# Patient Record
Sex: Female | Born: 1945
Health system: Southern US, Community
[De-identification: ages and names within clinical notes are randomized; demographics above are authoritative.]

## PROBLEM LIST (undated history)

## (undated) DIAGNOSIS — J45909 Unspecified asthma, uncomplicated: Secondary | ICD-10-CM

---

## 1998-04-04 ENCOUNTER — Ambulatory Visit (HOSPITAL_COMMUNITY): Admission: RE | Admit: 1998-04-04 | Discharge: 1998-04-04 | Payer: Self-pay | Admitting: Obstetrics and Gynecology

## 1998-04-04 ENCOUNTER — Encounter: Payer: Self-pay | Admitting: Obstetrics and Gynecology

## 1998-10-12 ENCOUNTER — Other Ambulatory Visit: Admission: RE | Admit: 1998-10-12 | Discharge: 1998-10-12 | Payer: Self-pay | Admitting: Obstetrics and Gynecology

## 1999-04-08 ENCOUNTER — Encounter: Payer: Self-pay | Admitting: Obstetrics and Gynecology

## 1999-04-08 ENCOUNTER — Ambulatory Visit (HOSPITAL_COMMUNITY): Admission: RE | Admit: 1999-04-08 | Discharge: 1999-04-08 | Payer: Self-pay | Admitting: Obstetrics and Gynecology

## 1999-12-09 ENCOUNTER — Other Ambulatory Visit: Admission: RE | Admit: 1999-12-09 | Discharge: 1999-12-09 | Payer: Self-pay | Admitting: Obstetrics and Gynecology

## 2000-04-09 ENCOUNTER — Ambulatory Visit (HOSPITAL_COMMUNITY): Admission: RE | Admit: 2000-04-09 | Discharge: 2000-04-09 | Payer: Self-pay | Admitting: Obstetrics and Gynecology

## 2000-04-09 ENCOUNTER — Encounter: Payer: Self-pay | Admitting: Obstetrics and Gynecology

## 2001-01-07 ENCOUNTER — Other Ambulatory Visit: Admission: RE | Admit: 2001-01-07 | Discharge: 2001-01-07 | Payer: Self-pay | Admitting: Obstetrics and Gynecology

## 2001-04-12 ENCOUNTER — Ambulatory Visit (HOSPITAL_COMMUNITY): Admission: RE | Admit: 2001-04-12 | Discharge: 2001-04-12 | Payer: Self-pay | Admitting: Obstetrics and Gynecology

## 2001-04-12 ENCOUNTER — Encounter: Payer: Self-pay | Admitting: Obstetrics and Gynecology

## 2001-05-28 ENCOUNTER — Encounter (INDEPENDENT_AMBULATORY_CARE_PROVIDER_SITE_OTHER): Payer: Self-pay

## 2001-05-28 ENCOUNTER — Ambulatory Visit (HOSPITAL_COMMUNITY): Admission: RE | Admit: 2001-05-28 | Discharge: 2001-05-28 | Payer: Self-pay | Admitting: Obstetrics and Gynecology

## 2002-04-13 ENCOUNTER — Encounter: Payer: Self-pay | Admitting: Obstetrics and Gynecology

## 2002-04-13 ENCOUNTER — Ambulatory Visit (HOSPITAL_COMMUNITY): Admission: RE | Admit: 2002-04-13 | Discharge: 2002-04-13 | Payer: Self-pay | Admitting: Obstetrics and Gynecology

## 2002-04-20 ENCOUNTER — Other Ambulatory Visit: Admission: RE | Admit: 2002-04-20 | Discharge: 2002-04-20 | Payer: Self-pay | Admitting: Obstetrics and Gynecology

## 2003-04-17 ENCOUNTER — Ambulatory Visit (HOSPITAL_COMMUNITY): Admission: RE | Admit: 2003-04-17 | Discharge: 2003-04-17 | Payer: Self-pay | Admitting: Obstetrics and Gynecology

## 2003-04-24 ENCOUNTER — Other Ambulatory Visit: Admission: RE | Admit: 2003-04-24 | Discharge: 2003-04-24 | Payer: Self-pay | Admitting: Obstetrics and Gynecology

## 2004-04-22 ENCOUNTER — Ambulatory Visit (HOSPITAL_COMMUNITY): Admission: RE | Admit: 2004-04-22 | Discharge: 2004-04-22 | Payer: Self-pay | Admitting: Obstetrics and Gynecology

## 2004-07-16 ENCOUNTER — Other Ambulatory Visit: Admission: RE | Admit: 2004-07-16 | Discharge: 2004-07-16 | Payer: Self-pay | Admitting: Obstetrics and Gynecology

## 2004-09-09 ENCOUNTER — Ambulatory Visit (HOSPITAL_COMMUNITY): Admission: RE | Admit: 2004-09-09 | Discharge: 2004-09-09 | Payer: Self-pay | Admitting: Gastroenterology

## 2005-04-30 ENCOUNTER — Ambulatory Visit (HOSPITAL_COMMUNITY): Admission: RE | Admit: 2005-04-30 | Discharge: 2005-04-30 | Payer: Self-pay | Admitting: Obstetrics and Gynecology

## 2005-08-01 ENCOUNTER — Other Ambulatory Visit: Admission: RE | Admit: 2005-08-01 | Discharge: 2005-08-01 | Payer: Self-pay | Admitting: Obstetrics and Gynecology

## 2006-05-04 ENCOUNTER — Ambulatory Visit (HOSPITAL_COMMUNITY): Admission: RE | Admit: 2006-05-04 | Discharge: 2006-05-04 | Payer: Self-pay | Admitting: Obstetrics and Gynecology

## 2007-05-07 ENCOUNTER — Ambulatory Visit (HOSPITAL_COMMUNITY): Admission: RE | Admit: 2007-05-07 | Discharge: 2007-05-07 | Payer: Self-pay | Admitting: Obstetrics and Gynecology

## 2008-05-08 ENCOUNTER — Ambulatory Visit (HOSPITAL_COMMUNITY): Admission: RE | Admit: 2008-05-08 | Discharge: 2008-05-08 | Payer: Self-pay | Admitting: Obstetrics and Gynecology

## 2009-05-09 ENCOUNTER — Ambulatory Visit (HOSPITAL_COMMUNITY): Admission: RE | Admit: 2009-05-09 | Discharge: 2009-05-09 | Payer: Self-pay | Admitting: Obstetrics and Gynecology

## 2010-05-10 ENCOUNTER — Ambulatory Visit (HOSPITAL_COMMUNITY)
Admission: RE | Admit: 2010-05-10 | Discharge: 2010-05-10 | Payer: Self-pay | Source: Home / Self Care | Attending: Obstetrics and Gynecology | Admitting: Obstetrics and Gynecology

## 2010-10-11 NOTE — Op Note (Signed)
NAME:  Linda Moreno, Linda Moreno                  ACCOUNT NO.:  [id number]   MEDICAL RECORD NO.:  [id number]          PATIENT TYPE:  AMB   LOCATION:  ENDO                         FACILITY:  [facility]   PHYSICIAN:  James L. Malon Kindle., M.D.DATE OF BIRTH:  1946-03-11   DATE OF PROCEDURE:  09/09/2004  DATE OF DISCHARGE:                                 OPERATIVE REPORT   PROCEDURE:  Colonoscopy.   MEDICATIONS:  Fentanyl 75 mcg, Versed 8 mg IV.   SCOPE:  Olympus pediatric adjustable colonoscope.   INDICATIONS:  Colon cancer screening.   DESCRIPTION OF PROCEDURE:  The procedure has been explained to the patient  and consent obtained.  With the patient in the left lateral decubitus  position, the Olympus scope was inserted and advanced.  The prep was  excellent.  I was able to reach the cecum without difficulty.  The ileocecal  valve and appendiceal orifice seen.  The scope withdrawn in the cecum.  The  ascending colon, transverse colon, splenic flexure, descending and sigmoid  colon were seen well.  No polyps or other lesions seen.  The scope was  withdrawn back into the rectum.  The rectum was free of polyps.  The patient  tolerated the procedure well.  She was maintained on low-flow oxygen and  pulse oximeter throughout the procedure with no obvious problem.   ASSESSMENT:  Normal screening colonoscopy.  V76.51.   PLAN:  Will recommend yearly Hemoccults and possibly a repeat procedure in  10 years.      JLE/MEDQ  D:  09/09/2004  T:  09/09/2004  Job:  [id number]   cc:   Gregary Signs A. Everardo All, M.D. [facility]   Juluis Mire, M.D.  [address] [place]  [place] [address]  Fax: [phone]

## 2010-10-11 NOTE — H&P (Signed)
[facility]  Patient:    Linda Moreno, Linda Moreno Visit Number: [id number] MRN: [id number]          Service Type: Attending:  Juluis Mire, M.D. Dictated by:   Juluis Mire, M.D. Adm. Date:  05/28/01                           History and Physical  HISTORY OF PRESENT ILLNESS:   The patient is a 65 year old postmenopausal black female who presents for a hysteroscopic evaluation due to postmenopausal bleeding and probable endometrial polyp.  In relation to the present admission, the patient had discontinued hormone replacement therapy.  Subsequently had the onset of vaginal bleeding in October.  She came in for a saline infusion ultrasound, which revealed a possible endometrial polyp and thickening, and now present for hysteroscopic evaluation to rule out an endometrial pathology.  ALLERGIES:                    She has no known drug allergies.  MEDICATIONS:                  None.  PAST MEDICAL HISTORY:         Usual childhood diseases, no significant sequelae.  Does have a history of asthma.  PAST SURGICAL HISTORY:        None.  OBSTETRICAL HISTORY:          She has had three spontaneous vaginal deliveries.  FAMILY HISTORY:               Noncontributory.  SOCIAL HISTORY:               No tobacco or alcohol use.  REVIEW OF SYSTEMS:            Noncontributory.  PHYSICAL EXAMINATION:  VITAL SIGNS:                  The patient is afebrile with stable vital signs.  HEENT:                        Patient normocephalic.  Pupils equal, round and reactive to light and accommodation.  Extraocular movements were intact. Sclerae and conjunctivae clear.  Oropharynx clear.  NECK:                         Without thyromegaly.  BREASTS:                      No discrete masses.  CHEST:                        Lungs clear.  CARDIAC:                      Regular rhythm and rate without murmurs or gallops.  ABDOMEN:                      Benign.  No masses,  organomegaly, or tenderness.  PELVIC:                       Normal external genitalia.  Vaginal mucosa clear.  Cervix unremarkable.  Uterus normal size, shape, and contour.  Adnexa free of mass or tenderness.  Rectovaginal exam is clear.  EXTREMITIES:  Trace edema.  NEUROLOGIC:                   Grossly within normal limits.  IMPRESSION:                   Postmenopausal bleeding with evidence of endometrial thickening and/or polyp.  PLAN:                         The patient will undergo hysteroscopic evaluation.  The risks have been discussed including the risk of anesthesia, the risk of vascular injury that could lead to hemorrhage requiring hysterectomy or possible transfusion with the risk of AIDS or hepatitis, risk of injury to adjacent organs through uterine perforation that could require laparoscopy and possible exploratory laparotomy, risk of deep venous thrombosis or pulmonary embolus.  The patient expressed an understanding of indications and risks. Dictated by:   Juluis Mire, M.D. Attending:  Juluis Mire, M.D. DD:  05/28/01 TD:  05/28/01 Job: [id number]

## 2010-10-11 NOTE — Op Note (Signed)
[facility] [place]  Patient:    Linda Moreno, Linda Moreno Visit Number: [id number] MRN: [id number]          Service Type: DSU Location: [facility] Attending Physician:  Frederich Balding Dictated by:   Juluis Mire, M.D. Proc. Date: 05/28/01 Admit Date:  05/28/2001                             Operative Report  PREOPERATIVE DIAGNOSES:       Endometrial polyp.  POSTOPERATIVE DIAGNOSES:      Endometrial polyp.  OPERATIVE PROCEDURE:          Hysteroscopy with resection of endometrial polyp, multiple endometrial biopsies, endometrial curettings.  SURGEON:                      Juluis Mire, M.D.  ANESTHESIA:                   General.  ESTIMATED BLOOD LOSS:         Minimal.  PACKS AND DRAINS:             None.  INTRAOPERATIVE BLOOD:         None.  COMPLICATIONS:                None.  INDICATIONS:                  Dictated in the history and physical.  PROCEDURE:                    Patient was taken to the OR and placed in the supine position.  After a satisfactory level of general anesthesia using the laryngeal mask the patient was placed in dorsal lithotomy position using Allen stirrups.  Perineum and vagina prepped out with Betadine.  Draped in a sterile field.  Speculum was placed in the vaginal vault.  Cervix grasped with Christella Hartigan tenaculum.  Uterus sounded to approximately 8 cm.  Cervix serially dilated to a size 35 Pratt dilator.  An operative hysteroscope was introduced. Visualization revealed a posterior wall polyp.  This was resected and sent for pathologic review.  Multiple endometrial samples were obtained along with curettings.  There was no evidence of perforation or active bleeding.  The single tooth tenaculum was then removed.  The patient taken out of the dorsal lithotomy position.  Once alert and extubated transferred to recovery room in good condition.  Sponge, needle, instrument count reported as correct by circulated nurse. Dictated by:    Juluis Mire, M.D. Attending Physician:  Frederich Balding DD:  05/28/01 TD:  05/28/01 Job: [id number]

## 2011-04-09 ENCOUNTER — Other Ambulatory Visit (HOSPITAL_COMMUNITY): Payer: Self-pay | Admitting: Obstetrics and Gynecology

## 2011-04-09 DIAGNOSIS — Z1231 Encounter for screening mammogram for malignant neoplasm of breast: Secondary | ICD-10-CM

## 2011-05-12 ENCOUNTER — Ambulatory Visit (HOSPITAL_COMMUNITY)
Admission: RE | Admit: 2011-05-12 | Discharge: 2011-05-12 | Disposition: A | Discharge status: Home / Self Care | Payer: Medicare Other | Source: Ambulatory Visit | Attending: Obstetrics and Gynecology | Admitting: Obstetrics and Gynecology

## 2011-05-12 DIAGNOSIS — Z1231 Encounter for screening mammogram for malignant neoplasm of breast: Secondary | ICD-10-CM

## 2012-04-16 ENCOUNTER — Other Ambulatory Visit (HOSPITAL_COMMUNITY): Payer: Self-pay | Admitting: Obstetrics and Gynecology

## 2012-04-16 DIAGNOSIS — Z1231 Encounter for screening mammogram for malignant neoplasm of breast: Secondary | ICD-10-CM

## 2012-05-12 ENCOUNTER — Ambulatory Visit (HOSPITAL_COMMUNITY)
Admission: RE | Admit: 2012-05-12 | Discharge: 2012-05-12 | Disposition: A | Discharge status: Home / Self Care | Payer: Medicare Other | Source: Ambulatory Visit | Attending: Obstetrics and Gynecology | Admitting: Obstetrics and Gynecology

## 2012-05-12 DIAGNOSIS — Z1231 Encounter for screening mammogram for malignant neoplasm of breast: Secondary | ICD-10-CM | POA: Insufficient documentation

## 2013-04-11 ENCOUNTER — Other Ambulatory Visit (HOSPITAL_COMMUNITY): Payer: Self-pay | Admitting: Obstetrics and Gynecology

## 2013-04-11 DIAGNOSIS — Z1231 Encounter for screening mammogram for malignant neoplasm of breast: Secondary | ICD-10-CM

## 2013-05-16 ENCOUNTER — Ambulatory Visit (HOSPITAL_COMMUNITY)
Admission: RE | Admit: 2013-05-16 | Discharge: 2013-05-16 | Disposition: A | Discharge status: Home / Self Care | Payer: Medicare Other | Source: Ambulatory Visit | Attending: Obstetrics and Gynecology | Admitting: Obstetrics and Gynecology

## 2013-05-16 DIAGNOSIS — Z1231 Encounter for screening mammogram for malignant neoplasm of breast: Secondary | ICD-10-CM | POA: Insufficient documentation

## 2014-04-11 ENCOUNTER — Other Ambulatory Visit (HOSPITAL_COMMUNITY): Payer: Self-pay | Admitting: Internal Medicine

## 2014-04-11 DIAGNOSIS — Z1231 Encounter for screening mammogram for malignant neoplasm of breast: Secondary | ICD-10-CM

## 2014-05-23 ENCOUNTER — Ambulatory Visit (HOSPITAL_COMMUNITY)
Admission: RE | Admit: 2014-05-23 | Discharge: 2014-05-23 | Disposition: A | Discharge status: Home / Self Care | Payer: Medicare HMO | Source: Ambulatory Visit | Attending: Internal Medicine | Admitting: Internal Medicine

## 2014-05-23 DIAGNOSIS — Z1231 Encounter for screening mammogram for malignant neoplasm of breast: Secondary | ICD-10-CM

## 2014-12-11 ENCOUNTER — Other Ambulatory Visit: Payer: Self-pay | Admitting: Gastroenterology

## 2015-05-07 ENCOUNTER — Other Ambulatory Visit: Payer: Self-pay

## 2015-05-07 DIAGNOSIS — Z1231 Encounter for screening mammogram for malignant neoplasm of breast: Secondary | ICD-10-CM

## 2015-05-18 ENCOUNTER — Ambulatory Visit
Admission: RE | Admit: 2015-05-18 | Discharge: 2015-05-18 | Disposition: A | Discharge status: Home / Self Care | Payer: Medicare HMO | Source: Ambulatory Visit | Attending: Allergy | Admitting: Allergy

## 2015-05-18 ENCOUNTER — Other Ambulatory Visit: Payer: Self-pay | Admitting: Allergy

## 2015-05-18 DIAGNOSIS — J4530 Mild persistent asthma, uncomplicated: Secondary | ICD-10-CM

## 2015-05-29 ENCOUNTER — Ambulatory Visit: Payer: No Typology Code available for payment source

## 2015-06-05 ENCOUNTER — Ambulatory Visit
Admission: RE | Admit: 2015-06-05 | Discharge: 2015-06-05 | Disposition: A | Discharge status: Home / Self Care | Payer: Medicare HMO | Source: Ambulatory Visit

## 2015-06-05 DIAGNOSIS — Z1231 Encounter for screening mammogram for malignant neoplasm of breast: Secondary | ICD-10-CM

## 2016-04-28 ENCOUNTER — Other Ambulatory Visit: Payer: Self-pay | Admitting: Obstetrics and Gynecology

## 2016-04-28 DIAGNOSIS — Z1231 Encounter for screening mammogram for malignant neoplasm of breast: Secondary | ICD-10-CM

## 2016-05-28 DIAGNOSIS — J3089 Other allergic rhinitis: Secondary | ICD-10-CM | POA: Diagnosis not present

## 2016-05-28 DIAGNOSIS — J301 Allergic rhinitis due to pollen: Secondary | ICD-10-CM | POA: Diagnosis not present

## 2016-06-03 DIAGNOSIS — J3089 Other allergic rhinitis: Secondary | ICD-10-CM | POA: Diagnosis not present

## 2016-06-03 DIAGNOSIS — J301 Allergic rhinitis due to pollen: Secondary | ICD-10-CM | POA: Diagnosis not present

## 2016-06-05 ENCOUNTER — Ambulatory Visit
Admission: RE | Admit: 2016-06-05 | Discharge: 2016-06-05 | Disposition: A | Discharge status: Home / Self Care | Payer: PPO | Source: Ambulatory Visit | Attending: Obstetrics and Gynecology | Admitting: Obstetrics and Gynecology

## 2016-06-05 ENCOUNTER — Ambulatory Visit: Payer: Medicare HMO

## 2016-06-05 DIAGNOSIS — Z1231 Encounter for screening mammogram for malignant neoplasm of breast: Secondary | ICD-10-CM

## 2016-06-09 DIAGNOSIS — J301 Allergic rhinitis due to pollen: Secondary | ICD-10-CM | POA: Diagnosis not present

## 2016-06-09 DIAGNOSIS — J3089 Other allergic rhinitis: Secondary | ICD-10-CM | POA: Diagnosis not present

## 2016-06-13 DIAGNOSIS — J301 Allergic rhinitis due to pollen: Secondary | ICD-10-CM | POA: Diagnosis not present

## 2016-06-13 DIAGNOSIS — J3089 Other allergic rhinitis: Secondary | ICD-10-CM | POA: Diagnosis not present

## 2016-06-24 ENCOUNTER — Ambulatory Visit (HOSPITAL_COMMUNITY)
Admission: EM | Admit: 2016-06-24 | Discharge: 2016-06-24 | Disposition: A | Discharge status: Home / Self Care | Payer: PPO | Attending: Family Medicine | Admitting: Family Medicine

## 2016-06-24 ENCOUNTER — Ambulatory Visit (INDEPENDENT_AMBULATORY_CARE_PROVIDER_SITE_OTHER): Payer: PPO

## 2016-06-24 ENCOUNTER — Encounter (HOSPITAL_COMMUNITY): Payer: Self-pay | Admitting: Emergency Medicine

## 2016-06-24 DIAGNOSIS — J4521 Mild intermittent asthma with (acute) exacerbation: Secondary | ICD-10-CM

## 2016-06-24 DIAGNOSIS — R05 Cough: Secondary | ICD-10-CM | POA: Diagnosis not present

## 2016-06-24 HISTORY — DX: Unspecified asthma, uncomplicated: J45.909

## 2016-06-24 MED ORDER — METHYLPREDNISOLONE SODIUM SUCC 125 MG IJ SOLR
INTRAMUSCULAR | Status: AC
  Filled 2016-06-24: qty 2

## 2016-06-24 MED ORDER — METHYLPREDNISOLONE SODIUM SUCC 125 MG IJ SOLR
125.0000 mg | Freq: Once | INTRAMUSCULAR | Status: AC
  Administered 2016-06-24: 125 mg via INTRAMUSCULAR

## 2016-06-24 MED ORDER — ALBUTEROL SULFATE (2.5 MG/3ML) 0.083% IN NEBU
INHALATION_SOLUTION | RESPIRATORY_TRACT | Status: AC
  Filled 2016-06-24: qty 6

## 2016-06-24 MED ORDER — ALBUTEROL SULFATE (2.5 MG/3ML) 0.083% IN NEBU
5.0000 mg | INHALATION_SOLUTION | Freq: Once | RESPIRATORY_TRACT | Status: AC
  Administered 2016-06-24: 5 mg via RESPIRATORY_TRACT

## 2016-06-24 MED ORDER — IPRATROPIUM BROMIDE 0.02 % IN SOLN
RESPIRATORY_TRACT | Status: AC
  Filled 2016-06-24: qty 2.5

## 2016-06-24 MED ORDER — IPRATROPIUM BROMIDE 0.02 % IN SOLN
0.5000 mg | Freq: Once | RESPIRATORY_TRACT | Status: AC
  Administered 2016-06-24: 0.5 mg via RESPIRATORY_TRACT

## 2016-06-24 NOTE — ED Triage Notes (Signed)
Pt has been suffering from a productive cough for one week.  She denies any other symptoms, including no fever.  Pt has a history of asthma and allergy issues.

## 2016-06-24 NOTE — ED Provider Notes (Signed)
[place]    CSN: [id number] Arrival date & time: 06/24/16  1439     History   Chief Complaint Chief Complaint  Patient presents with  . Cough    HPI Linda Moreno No is a 71 y.o. female.   The history is provided by the patient.  Cough  Cough characteristics:  Productive Sputum characteristics:  Clear Severity:  Moderate Onset quality:  Gradual Duration:  1 week Progression:  Unchanged Chronicity:  New Smoker: no   Context comment:  H/o asthma Relieved by:  None tried Associated symptoms: rhinorrhea, shortness of breath and wheezing   Associated symptoms: no fever     Past Medical History:  Diagnosis Date  . Asthma     There are no active problems to display for this patient.   History reviewed. No pertinent surgical history.  OB History    No data available       Home Medications    Prior to Admission medications   Medication Sig Start Date End Date Taking? Authorizing Provider  albuterol (PROVENTIL HFA;VENTOLIN HFA) 108 (90 Base) MCG/ACT inhaler Inhale 2 puffs into the lungs every 4 (four) hours as needed for wheezing or shortness of breath.   Yes Historical Provider, MD  fluticasone furoate-vilanterol (BREO ELLIPTA) 100-25 MCG/INH AEPB Inhale 1 puff into the lungs daily.   Yes Historical Provider, MD    Family History History reviewed. No pertinent family history.  Social History Social History  Substance Use Topics  . Smoking status: Never Smoker  . Smokeless tobacco: Never Used  . Alcohol use No     Allergies   Patient has no known allergies.   Review of Systems Review of Systems  Constitutional: Negative for fever.  HENT: Positive for rhinorrhea.   Respiratory: Positive for cough, shortness of breath and wheezing.      Physical Exam Triage Vital Signs ED Triage Vitals [06/24/16 1614]  Enc Vitals Group     BP (!) 162/104     Pulse Rate 88     Resp      Temp 98.9 F (37.2 C)     Temp Source Oral     SpO2 97 %       Weight      Height      Head Circumference      Peak Flow      Pain Score      Pain Loc      Pain Edu?      Excl. in [place]?    No data found.   Updated Vital Signs BP (!) 162/104 (BP Location: Right Arm)   Pulse 88   Temp 98.9 F (37.2 C) (Oral)   SpO2 97%   Visual Acuity Right Eye Distance:   Left Eye Distance:   Bilateral Distance:    Right Eye Near:   Left Eye Near:    Bilateral Near:     Physical Exam   UC Treatments / Results  Labs (all labs ordered are listed, but only abnormal results are displayed) Labs Reviewed - No data to display  EKG  EKG Interpretation None       Radiology Dg Chest 2 View  Result Date: 06/24/2016 CLINICAL DATA:  One week of cough and body aches. Nonsmoker. History of asthma. EXAM: CHEST  2 VIEW COMPARISON:  Chest x-ray of May 18, 2015 FINDINGS: The lungs are well-expanded. There is no focal infiltrate. There is no pleural effusion. The heart and pulmonary vascularity are normal. The  mediastinum is normal in width. There is calcification in the wall of the aortic arch. There is multilevel degenerative disc disease of the thoracic spine. IMPRESSION: There is no pneumonia, CHF, nor other acute cardiopulmonary abnormality. Thoracic aortic atherosclerosis. Electronically Signed   By: David  Martinique M.D.   On: 06/24/2016 17:10    Procedures Procedures (including critical care time)  Medications Ordered in UC Medications  albuterol (PROVENTIL) (2.5 MG/3ML) 0.083% nebulizer solution 5 mg (5 mg Nebulization Given 06/24/16 1812)  ipratropium (ATROVENT) nebulizer solution 0.5 mg (0.5 mg Nebulization Given 06/24/16 1812)  methylPREDNISolone sodium succinate (SOLU-MEDROL) 125 mg/2 mL injection 125 mg (125 mg Intramuscular Given 06/24/16 1809)     Initial Impression / Assessment and Plan / UC Course  I have reviewed the triage vital signs and the nursing notes.  Pertinent labs & imaging results that were available during my care of the  patient were reviewed by me and considered in my medical decision making (see chart for details).       Final Clinical Impressions(s) / UC Diagnoses   Final diagnoses:  Mild intermittent asthma with exacerbation    New Prescriptions Discharge Medication List as of 06/24/2016  6:09 PM       Billy Fischer, MD 06/26/16 [phone]

## 2016-06-24 NOTE — Discharge Instructions (Signed)
Use your medicine as needed and see your doctor if further problems.

## 2016-06-26 DIAGNOSIS — J3089 Other allergic rhinitis: Secondary | ICD-10-CM | POA: Diagnosis not present

## 2016-06-26 DIAGNOSIS — J301 Allergic rhinitis due to pollen: Secondary | ICD-10-CM | POA: Diagnosis not present

## 2016-06-27 DIAGNOSIS — J45909 Unspecified asthma, uncomplicated: Secondary | ICD-10-CM | POA: Diagnosis not present

## 2016-07-09 DIAGNOSIS — J301 Allergic rhinitis due to pollen: Secondary | ICD-10-CM | POA: Diagnosis not present

## 2016-07-09 DIAGNOSIS — J3089 Other allergic rhinitis: Secondary | ICD-10-CM | POA: Diagnosis not present

## 2016-07-16 DIAGNOSIS — J301 Allergic rhinitis due to pollen: Secondary | ICD-10-CM | POA: Diagnosis not present

## 2016-07-16 DIAGNOSIS — J3089 Other allergic rhinitis: Secondary | ICD-10-CM | POA: Diagnosis not present

## 2016-07-22 DIAGNOSIS — J301 Allergic rhinitis due to pollen: Secondary | ICD-10-CM | POA: Diagnosis not present

## 2016-07-22 DIAGNOSIS — J3089 Other allergic rhinitis: Secondary | ICD-10-CM | POA: Diagnosis not present

## 2016-07-30 DIAGNOSIS — J3081 Allergic rhinitis due to animal (cat) (dog) hair and dander: Secondary | ICD-10-CM | POA: Diagnosis not present

## 2016-07-30 DIAGNOSIS — J3089 Other allergic rhinitis: Secondary | ICD-10-CM | POA: Diagnosis not present

## 2016-07-30 DIAGNOSIS — J301 Allergic rhinitis due to pollen: Secondary | ICD-10-CM | POA: Diagnosis not present

## 2016-08-06 DIAGNOSIS — J3089 Other allergic rhinitis: Secondary | ICD-10-CM | POA: Diagnosis not present

## 2016-08-06 DIAGNOSIS — J301 Allergic rhinitis due to pollen: Secondary | ICD-10-CM | POA: Diagnosis not present

## 2016-08-12 DIAGNOSIS — J3089 Other allergic rhinitis: Secondary | ICD-10-CM | POA: Diagnosis not present

## 2016-08-12 DIAGNOSIS — J3081 Allergic rhinitis due to animal (cat) (dog) hair and dander: Secondary | ICD-10-CM | POA: Diagnosis not present

## 2016-08-12 DIAGNOSIS — J301 Allergic rhinitis due to pollen: Secondary | ICD-10-CM | POA: Diagnosis not present

## 2016-08-19 DIAGNOSIS — J301 Allergic rhinitis due to pollen: Secondary | ICD-10-CM | POA: Diagnosis not present

## 2016-08-19 DIAGNOSIS — J3081 Allergic rhinitis due to animal (cat) (dog) hair and dander: Secondary | ICD-10-CM | POA: Diagnosis not present

## 2016-08-19 DIAGNOSIS — J3089 Other allergic rhinitis: Secondary | ICD-10-CM | POA: Diagnosis not present

## 2016-08-28 DIAGNOSIS — J301 Allergic rhinitis due to pollen: Secondary | ICD-10-CM | POA: Diagnosis not present

## 2016-08-28 DIAGNOSIS — J3089 Other allergic rhinitis: Secondary | ICD-10-CM | POA: Diagnosis not present

## 2016-09-02 DIAGNOSIS — J3089 Other allergic rhinitis: Secondary | ICD-10-CM | POA: Diagnosis not present

## 2016-09-02 DIAGNOSIS — J3081 Allergic rhinitis due to animal (cat) (dog) hair and dander: Secondary | ICD-10-CM | POA: Diagnosis not present

## 2016-09-02 DIAGNOSIS — J301 Allergic rhinitis due to pollen: Secondary | ICD-10-CM | POA: Diagnosis not present

## 2016-09-09 DIAGNOSIS — J3081 Allergic rhinitis due to animal (cat) (dog) hair and dander: Secondary | ICD-10-CM | POA: Diagnosis not present

## 2016-09-09 DIAGNOSIS — J3089 Other allergic rhinitis: Secondary | ICD-10-CM | POA: Diagnosis not present

## 2016-09-09 DIAGNOSIS — J301 Allergic rhinitis due to pollen: Secondary | ICD-10-CM | POA: Diagnosis not present

## 2016-09-18 DIAGNOSIS — J301 Allergic rhinitis due to pollen: Secondary | ICD-10-CM | POA: Diagnosis not present

## 2016-09-18 DIAGNOSIS — J3089 Other allergic rhinitis: Secondary | ICD-10-CM | POA: Diagnosis not present

## 2016-09-25 DIAGNOSIS — R7309 Other abnormal glucose: Secondary | ICD-10-CM | POA: Diagnosis not present

## 2016-09-25 DIAGNOSIS — E784 Other hyperlipidemia: Secondary | ICD-10-CM | POA: Diagnosis not present

## 2016-09-25 DIAGNOSIS — E785 Hyperlipidemia, unspecified: Secondary | ICD-10-CM | POA: Diagnosis not present

## 2016-09-25 DIAGNOSIS — T7840XS Allergy, unspecified, sequela: Secondary | ICD-10-CM | POA: Diagnosis not present

## 2016-09-25 DIAGNOSIS — J45909 Unspecified asthma, uncomplicated: Secondary | ICD-10-CM | POA: Diagnosis not present

## 2016-09-30 DIAGNOSIS — J3081 Allergic rhinitis due to animal (cat) (dog) hair and dander: Secondary | ICD-10-CM | POA: Diagnosis not present

## 2016-09-30 DIAGNOSIS — J3089 Other allergic rhinitis: Secondary | ICD-10-CM | POA: Diagnosis not present

## 2016-09-30 DIAGNOSIS — J301 Allergic rhinitis due to pollen: Secondary | ICD-10-CM | POA: Diagnosis not present

## 2016-10-07 DIAGNOSIS — J3089 Other allergic rhinitis: Secondary | ICD-10-CM | POA: Diagnosis not present

## 2016-10-07 DIAGNOSIS — J301 Allergic rhinitis due to pollen: Secondary | ICD-10-CM | POA: Diagnosis not present

## 2016-10-07 DIAGNOSIS — J3081 Allergic rhinitis due to animal (cat) (dog) hair and dander: Secondary | ICD-10-CM | POA: Diagnosis not present

## 2016-10-14 DIAGNOSIS — J301 Allergic rhinitis due to pollen: Secondary | ICD-10-CM | POA: Diagnosis not present

## 2016-10-14 DIAGNOSIS — J3089 Other allergic rhinitis: Secondary | ICD-10-CM | POA: Diagnosis not present

## 2016-10-14 DIAGNOSIS — J3081 Allergic rhinitis due to animal (cat) (dog) hair and dander: Secondary | ICD-10-CM | POA: Diagnosis not present

## 2016-10-22 DIAGNOSIS — J301 Allergic rhinitis due to pollen: Secondary | ICD-10-CM | POA: Diagnosis not present

## 2016-10-22 DIAGNOSIS — J3089 Other allergic rhinitis: Secondary | ICD-10-CM | POA: Diagnosis not present

## 2016-10-27 DIAGNOSIS — J301 Allergic rhinitis due to pollen: Secondary | ICD-10-CM | POA: Diagnosis not present

## 2016-10-27 DIAGNOSIS — Z6830 Body mass index (BMI) 30.0-30.9, adult: Secondary | ICD-10-CM | POA: Diagnosis not present

## 2016-10-27 DIAGNOSIS — Z01419 Encounter for gynecological examination (general) (routine) without abnormal findings: Secondary | ICD-10-CM | POA: Diagnosis not present

## 2016-10-27 DIAGNOSIS — J3089 Other allergic rhinitis: Secondary | ICD-10-CM | POA: Diagnosis not present

## 2016-11-05 DIAGNOSIS — J301 Allergic rhinitis due to pollen: Secondary | ICD-10-CM | POA: Diagnosis not present

## 2016-11-05 DIAGNOSIS — J3089 Other allergic rhinitis: Secondary | ICD-10-CM | POA: Diagnosis not present

## 2016-11-05 DIAGNOSIS — J3081 Allergic rhinitis due to animal (cat) (dog) hair and dander: Secondary | ICD-10-CM | POA: Diagnosis not present

## 2016-11-12 DIAGNOSIS — J3081 Allergic rhinitis due to animal (cat) (dog) hair and dander: Secondary | ICD-10-CM | POA: Diagnosis not present

## 2016-11-12 DIAGNOSIS — J3089 Other allergic rhinitis: Secondary | ICD-10-CM | POA: Diagnosis not present

## 2016-11-12 DIAGNOSIS — J301 Allergic rhinitis due to pollen: Secondary | ICD-10-CM | POA: Diagnosis not present

## 2016-11-19 DIAGNOSIS — J301 Allergic rhinitis due to pollen: Secondary | ICD-10-CM | POA: Diagnosis not present

## 2016-11-19 DIAGNOSIS — J3089 Other allergic rhinitis: Secondary | ICD-10-CM | POA: Diagnosis not present

## 2016-12-01 DIAGNOSIS — J301 Allergic rhinitis due to pollen: Secondary | ICD-10-CM | POA: Diagnosis not present

## 2016-12-01 DIAGNOSIS — J3089 Other allergic rhinitis: Secondary | ICD-10-CM | POA: Diagnosis not present

## 2016-12-12 DIAGNOSIS — J4530 Mild persistent asthma, uncomplicated: Secondary | ICD-10-CM | POA: Diagnosis not present

## 2016-12-12 DIAGNOSIS — J3089 Other allergic rhinitis: Secondary | ICD-10-CM | POA: Diagnosis not present

## 2016-12-12 DIAGNOSIS — Z91013 Allergy to seafood: Secondary | ICD-10-CM | POA: Diagnosis not present

## 2016-12-12 DIAGNOSIS — J301 Allergic rhinitis due to pollen: Secondary | ICD-10-CM | POA: Diagnosis not present

## 2016-12-17 DIAGNOSIS — J301 Allergic rhinitis due to pollen: Secondary | ICD-10-CM | POA: Diagnosis not present

## 2016-12-17 DIAGNOSIS — J3089 Other allergic rhinitis: Secondary | ICD-10-CM | POA: Diagnosis not present

## 2016-12-24 DIAGNOSIS — J301 Allergic rhinitis due to pollen: Secondary | ICD-10-CM | POA: Diagnosis not present

## 2016-12-24 DIAGNOSIS — J3089 Other allergic rhinitis: Secondary | ICD-10-CM | POA: Diagnosis not present

## 2016-12-24 DIAGNOSIS — J3081 Allergic rhinitis due to animal (cat) (dog) hair and dander: Secondary | ICD-10-CM | POA: Diagnosis not present

## 2016-12-31 DIAGNOSIS — J301 Allergic rhinitis due to pollen: Secondary | ICD-10-CM | POA: Diagnosis not present

## 2016-12-31 DIAGNOSIS — J3089 Other allergic rhinitis: Secondary | ICD-10-CM | POA: Diagnosis not present

## 2017-01-07 DIAGNOSIS — J3089 Other allergic rhinitis: Secondary | ICD-10-CM | POA: Diagnosis not present

## 2017-01-07 DIAGNOSIS — J301 Allergic rhinitis due to pollen: Secondary | ICD-10-CM | POA: Diagnosis not present

## 2017-01-13 DIAGNOSIS — J3089 Other allergic rhinitis: Secondary | ICD-10-CM | POA: Diagnosis not present

## 2017-01-13 DIAGNOSIS — J301 Allergic rhinitis due to pollen: Secondary | ICD-10-CM | POA: Diagnosis not present

## 2017-01-15 DIAGNOSIS — J3089 Other allergic rhinitis: Secondary | ICD-10-CM | POA: Diagnosis not present

## 2017-01-15 DIAGNOSIS — J301 Allergic rhinitis due to pollen: Secondary | ICD-10-CM | POA: Diagnosis not present

## 2017-01-19 DIAGNOSIS — J3089 Other allergic rhinitis: Secondary | ICD-10-CM | POA: Diagnosis not present

## 2017-01-19 DIAGNOSIS — J301 Allergic rhinitis due to pollen: Secondary | ICD-10-CM | POA: Diagnosis not present

## 2017-01-22 DIAGNOSIS — J3089 Other allergic rhinitis: Secondary | ICD-10-CM | POA: Diagnosis not present

## 2017-01-22 DIAGNOSIS — J301 Allergic rhinitis due to pollen: Secondary | ICD-10-CM | POA: Diagnosis not present

## 2017-01-28 DIAGNOSIS — J3089 Other allergic rhinitis: Secondary | ICD-10-CM | POA: Diagnosis not present

## 2017-01-28 DIAGNOSIS — J301 Allergic rhinitis due to pollen: Secondary | ICD-10-CM | POA: Diagnosis not present

## 2017-02-03 DIAGNOSIS — J301 Allergic rhinitis due to pollen: Secondary | ICD-10-CM | POA: Diagnosis not present

## 2017-02-03 DIAGNOSIS — J3089 Other allergic rhinitis: Secondary | ICD-10-CM | POA: Diagnosis not present

## 2017-02-11 DIAGNOSIS — J301 Allergic rhinitis due to pollen: Secondary | ICD-10-CM | POA: Diagnosis not present

## 2017-02-11 DIAGNOSIS — J3089 Other allergic rhinitis: Secondary | ICD-10-CM | POA: Diagnosis not present

## 2017-02-18 DIAGNOSIS — J301 Allergic rhinitis due to pollen: Secondary | ICD-10-CM | POA: Diagnosis not present

## 2017-02-18 DIAGNOSIS — J3089 Other allergic rhinitis: Secondary | ICD-10-CM | POA: Diagnosis not present

## 2017-02-18 DIAGNOSIS — J3081 Allergic rhinitis due to animal (cat) (dog) hair and dander: Secondary | ICD-10-CM | POA: Diagnosis not present

## 2017-02-25 DIAGNOSIS — J3089 Other allergic rhinitis: Secondary | ICD-10-CM | POA: Diagnosis not present

## 2017-02-25 DIAGNOSIS — J301 Allergic rhinitis due to pollen: Secondary | ICD-10-CM | POA: Diagnosis not present

## 2017-03-06 DIAGNOSIS — J3089 Other allergic rhinitis: Secondary | ICD-10-CM | POA: Diagnosis not present

## 2017-03-06 DIAGNOSIS — J301 Allergic rhinitis due to pollen: Secondary | ICD-10-CM | POA: Diagnosis not present

## 2017-03-11 DIAGNOSIS — J301 Allergic rhinitis due to pollen: Secondary | ICD-10-CM | POA: Diagnosis not present

## 2017-03-11 DIAGNOSIS — J3089 Other allergic rhinitis: Secondary | ICD-10-CM | POA: Diagnosis not present

## 2017-03-18 DIAGNOSIS — J3089 Other allergic rhinitis: Secondary | ICD-10-CM | POA: Diagnosis not present

## 2017-03-18 DIAGNOSIS — J301 Allergic rhinitis due to pollen: Secondary | ICD-10-CM | POA: Diagnosis not present

## 2017-03-18 DIAGNOSIS — J3081 Allergic rhinitis due to animal (cat) (dog) hair and dander: Secondary | ICD-10-CM | POA: Diagnosis not present

## 2017-03-30 DIAGNOSIS — J3089 Other allergic rhinitis: Secondary | ICD-10-CM | POA: Diagnosis not present

## 2017-03-30 DIAGNOSIS — J301 Allergic rhinitis due to pollen: Secondary | ICD-10-CM | POA: Diagnosis not present

## 2017-04-03 DIAGNOSIS — J45909 Unspecified asthma, uncomplicated: Secondary | ICD-10-CM | POA: Diagnosis not present

## 2017-04-03 DIAGNOSIS — E785 Hyperlipidemia, unspecified: Secondary | ICD-10-CM | POA: Diagnosis not present

## 2017-04-08 DIAGNOSIS — J301 Allergic rhinitis due to pollen: Secondary | ICD-10-CM | POA: Diagnosis not present

## 2017-04-08 DIAGNOSIS — J3089 Other allergic rhinitis: Secondary | ICD-10-CM | POA: Diagnosis not present

## 2017-04-14 DIAGNOSIS — J301 Allergic rhinitis due to pollen: Secondary | ICD-10-CM | POA: Diagnosis not present

## 2017-04-14 DIAGNOSIS — J3089 Other allergic rhinitis: Secondary | ICD-10-CM | POA: Diagnosis not present

## 2017-04-23 DIAGNOSIS — J301 Allergic rhinitis due to pollen: Secondary | ICD-10-CM | POA: Diagnosis not present

## 2017-04-23 DIAGNOSIS — J3081 Allergic rhinitis due to animal (cat) (dog) hair and dander: Secondary | ICD-10-CM | POA: Diagnosis not present

## 2017-04-24 DIAGNOSIS — J301 Allergic rhinitis due to pollen: Secondary | ICD-10-CM | POA: Diagnosis not present

## 2017-04-27 DIAGNOSIS — J3089 Other allergic rhinitis: Secondary | ICD-10-CM | POA: Diagnosis not present

## 2017-04-27 DIAGNOSIS — J301 Allergic rhinitis due to pollen: Secondary | ICD-10-CM | POA: Diagnosis not present

## 2017-04-28 ENCOUNTER — Other Ambulatory Visit: Payer: Self-pay | Admitting: Nurse Practitioner

## 2017-04-28 DIAGNOSIS — Z1231 Encounter for screening mammogram for malignant neoplasm of breast: Secondary | ICD-10-CM

## 2017-05-07 DIAGNOSIS — J3089 Other allergic rhinitis: Secondary | ICD-10-CM | POA: Diagnosis not present

## 2017-05-07 DIAGNOSIS — J3081 Allergic rhinitis due to animal (cat) (dog) hair and dander: Secondary | ICD-10-CM | POA: Diagnosis not present

## 2017-05-07 DIAGNOSIS — J301 Allergic rhinitis due to pollen: Secondary | ICD-10-CM | POA: Diagnosis not present

## 2017-05-15 DIAGNOSIS — J3089 Other allergic rhinitis: Secondary | ICD-10-CM | POA: Diagnosis not present

## 2017-05-15 DIAGNOSIS — J301 Allergic rhinitis due to pollen: Secondary | ICD-10-CM | POA: Diagnosis not present

## 2017-05-20 DIAGNOSIS — J3089 Other allergic rhinitis: Secondary | ICD-10-CM | POA: Diagnosis not present

## 2017-05-20 DIAGNOSIS — J301 Allergic rhinitis due to pollen: Secondary | ICD-10-CM | POA: Diagnosis not present

## 2017-05-29 DIAGNOSIS — J301 Allergic rhinitis due to pollen: Secondary | ICD-10-CM | POA: Diagnosis not present

## 2017-05-29 DIAGNOSIS — J3089 Other allergic rhinitis: Secondary | ICD-10-CM | POA: Diagnosis not present

## 2017-06-05 DIAGNOSIS — J4530 Mild persistent asthma, uncomplicated: Secondary | ICD-10-CM | POA: Diagnosis not present

## 2017-06-05 DIAGNOSIS — Z91013 Allergy to seafood: Secondary | ICD-10-CM | POA: Diagnosis not present

## 2017-06-05 DIAGNOSIS — J301 Allergic rhinitis due to pollen: Secondary | ICD-10-CM | POA: Diagnosis not present

## 2017-06-05 DIAGNOSIS — J3089 Other allergic rhinitis: Secondary | ICD-10-CM | POA: Diagnosis not present

## 2017-06-08 ENCOUNTER — Ambulatory Visit
Admission: RE | Admit: 2017-06-08 | Discharge: 2017-06-08 | Disposition: A | Discharge status: Home / Self Care | Payer: PPO | Source: Ambulatory Visit | Attending: Nurse Practitioner | Admitting: Nurse Practitioner

## 2017-06-08 DIAGNOSIS — Z1231 Encounter for screening mammogram for malignant neoplasm of breast: Secondary | ICD-10-CM | POA: Diagnosis not present

## 2017-06-09 DIAGNOSIS — J3089 Other allergic rhinitis: Secondary | ICD-10-CM | POA: Diagnosis not present

## 2017-06-10 DIAGNOSIS — J301 Allergic rhinitis due to pollen: Secondary | ICD-10-CM | POA: Diagnosis not present

## 2017-06-10 DIAGNOSIS — J3089 Other allergic rhinitis: Secondary | ICD-10-CM | POA: Diagnosis not present

## 2017-06-10 DIAGNOSIS — J3081 Allergic rhinitis due to animal (cat) (dog) hair and dander: Secondary | ICD-10-CM | POA: Diagnosis not present

## 2017-06-19 DIAGNOSIS — J3081 Allergic rhinitis due to animal (cat) (dog) hair and dander: Secondary | ICD-10-CM | POA: Diagnosis not present

## 2017-06-19 DIAGNOSIS — J301 Allergic rhinitis due to pollen: Secondary | ICD-10-CM | POA: Diagnosis not present

## 2017-06-19 DIAGNOSIS — J3089 Other allergic rhinitis: Secondary | ICD-10-CM | POA: Diagnosis not present

## 2017-06-26 DIAGNOSIS — J3089 Other allergic rhinitis: Secondary | ICD-10-CM | POA: Diagnosis not present

## 2017-06-26 DIAGNOSIS — J301 Allergic rhinitis due to pollen: Secondary | ICD-10-CM | POA: Diagnosis not present

## 2017-07-01 DIAGNOSIS — J301 Allergic rhinitis due to pollen: Secondary | ICD-10-CM | POA: Diagnosis not present

## 2017-07-01 DIAGNOSIS — J3089 Other allergic rhinitis: Secondary | ICD-10-CM | POA: Diagnosis not present

## 2017-07-10 DIAGNOSIS — J301 Allergic rhinitis due to pollen: Secondary | ICD-10-CM | POA: Diagnosis not present

## 2017-07-10 DIAGNOSIS — J3089 Other allergic rhinitis: Secondary | ICD-10-CM | POA: Diagnosis not present

## 2017-07-16 DIAGNOSIS — J3089 Other allergic rhinitis: Secondary | ICD-10-CM | POA: Diagnosis not present

## 2017-07-16 DIAGNOSIS — J301 Allergic rhinitis due to pollen: Secondary | ICD-10-CM | POA: Diagnosis not present

## 2017-07-23 DIAGNOSIS — J301 Allergic rhinitis due to pollen: Secondary | ICD-10-CM | POA: Diagnosis not present

## 2017-07-23 DIAGNOSIS — J3089 Other allergic rhinitis: Secondary | ICD-10-CM | POA: Diagnosis not present

## 2017-07-30 DIAGNOSIS — J3089 Other allergic rhinitis: Secondary | ICD-10-CM | POA: Diagnosis not present

## 2017-07-30 DIAGNOSIS — J301 Allergic rhinitis due to pollen: Secondary | ICD-10-CM | POA: Diagnosis not present

## 2017-08-06 DIAGNOSIS — J3081 Allergic rhinitis due to animal (cat) (dog) hair and dander: Secondary | ICD-10-CM | POA: Diagnosis not present

## 2017-08-06 DIAGNOSIS — J301 Allergic rhinitis due to pollen: Secondary | ICD-10-CM | POA: Diagnosis not present

## 2017-08-06 DIAGNOSIS — J3089 Other allergic rhinitis: Secondary | ICD-10-CM | POA: Diagnosis not present

## 2017-08-18 DIAGNOSIS — J301 Allergic rhinitis due to pollen: Secondary | ICD-10-CM | POA: Diagnosis not present

## 2017-08-18 DIAGNOSIS — J3089 Other allergic rhinitis: Secondary | ICD-10-CM | POA: Diagnosis not present

## 2017-08-27 DIAGNOSIS — J3089 Other allergic rhinitis: Secondary | ICD-10-CM | POA: Diagnosis not present

## 2017-08-27 DIAGNOSIS — J301 Allergic rhinitis due to pollen: Secondary | ICD-10-CM | POA: Diagnosis not present

## 2017-08-31 IMAGING — DX DG CHEST 2V
2 series · 2 of 2 positions shown · non-contrast
Comparison: Chest x-ray of May 18, 2015

CLINICAL DATA: One week of cough and body aches. Nonsmoker. History
of asthma.

EXAM:
CHEST  2 VIEW

[chest pa]
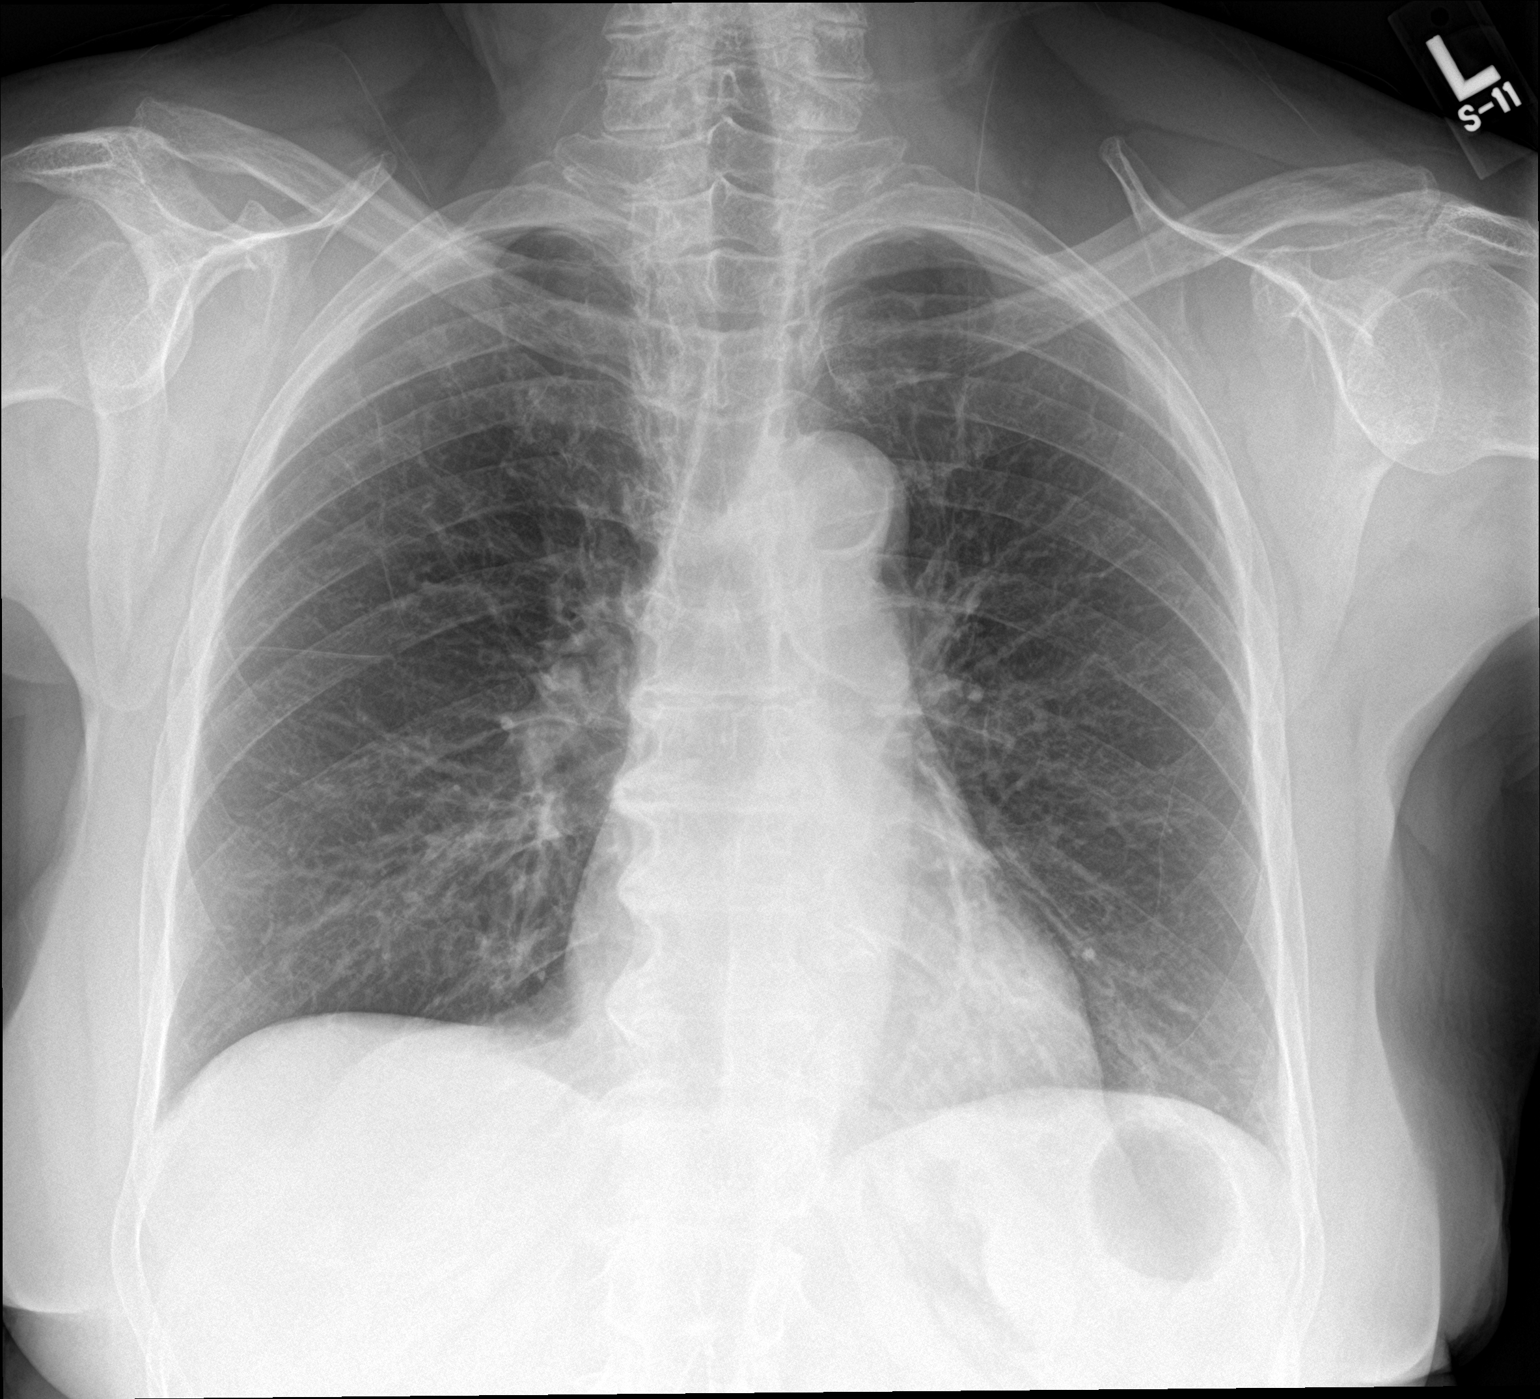

[chest lat]
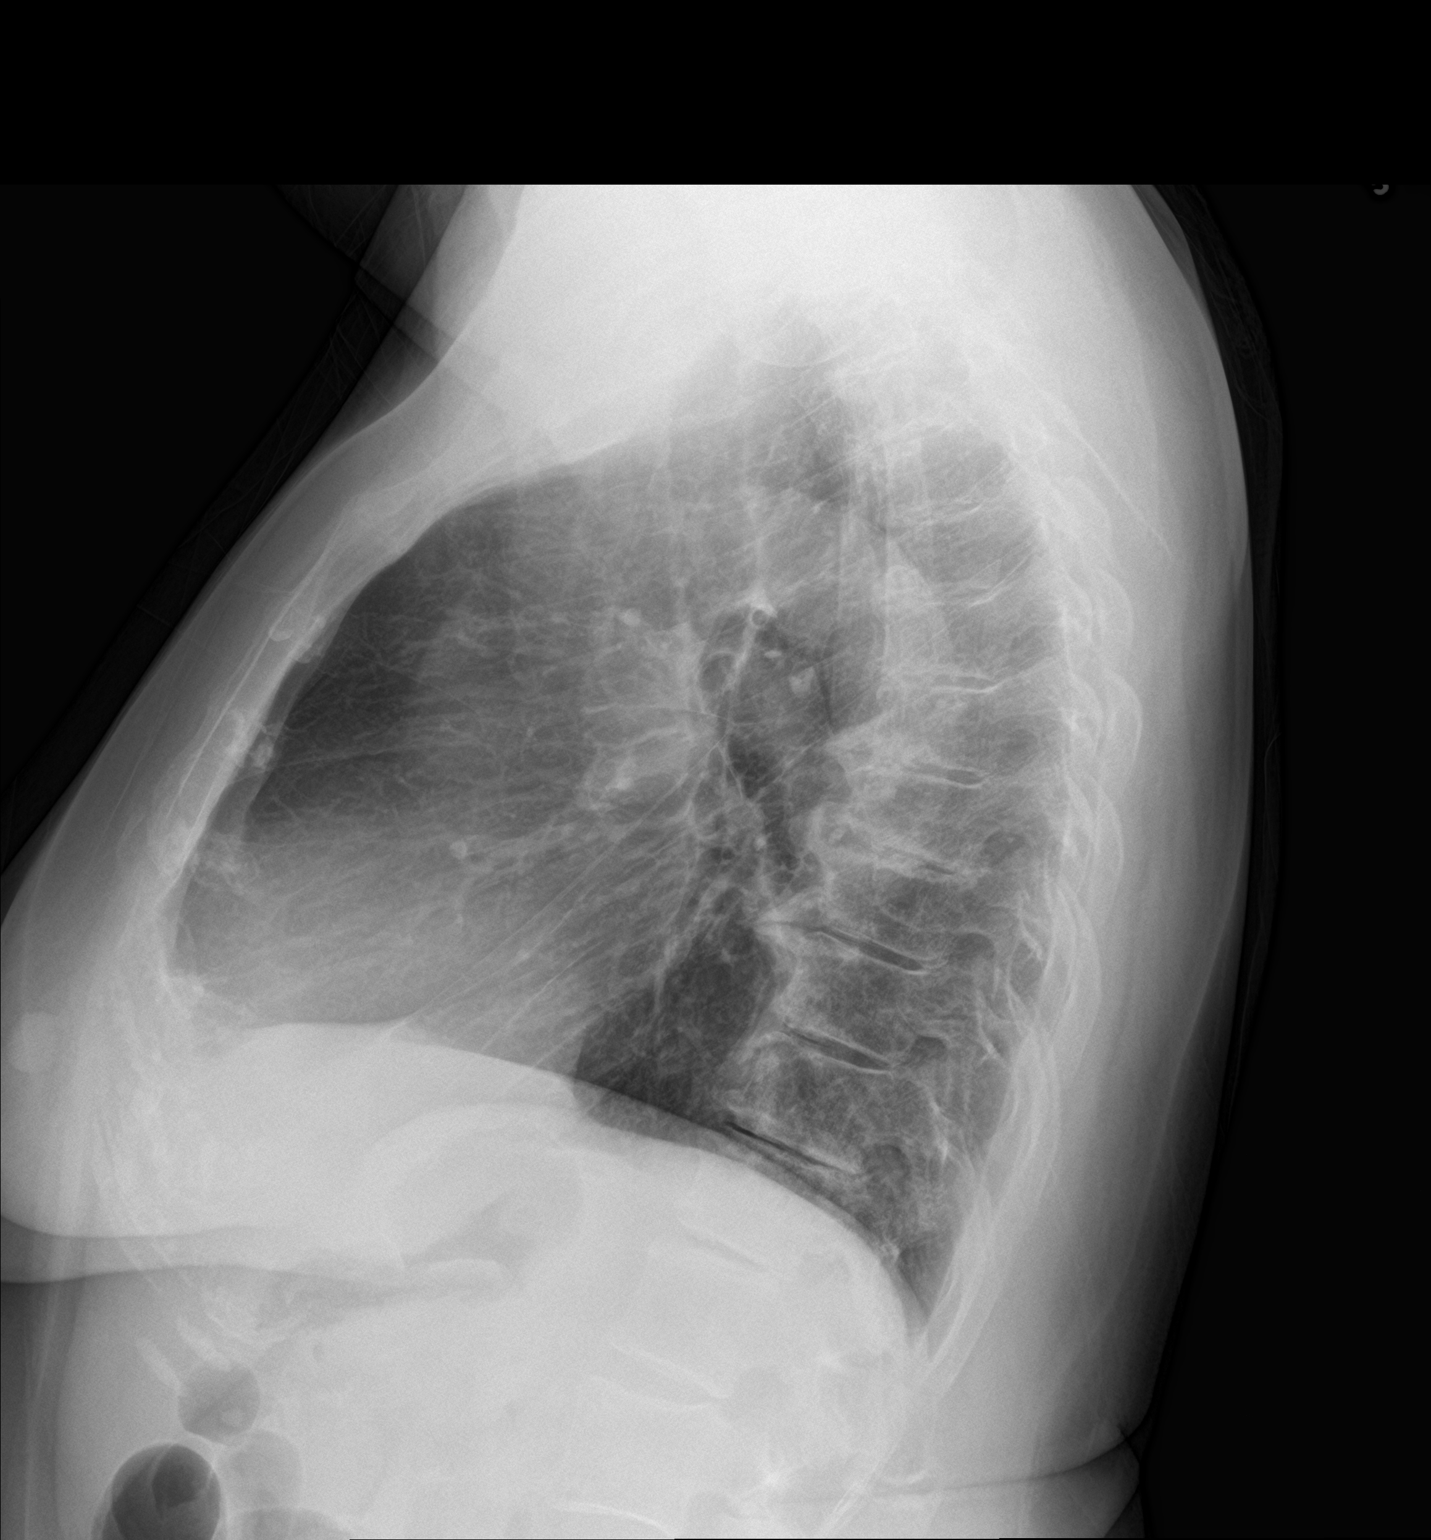

[2 of 2 positions shown; findings below may reference images not displayed]

FINDINGS: The lungs are well-expanded. There is no focal infiltrate. There is
no pleural effusion. The heart and pulmonary vascularity are normal.
The mediastinum is normal in width. There is calcification in the
wall of the aortic arch. There is multilevel degenerative disc
disease of the thoracic spine.
IMPRESSION: There is no pneumonia, CHF, nor other acute cardiopulmonary
abnormality.

Thoracic aortic atherosclerosis.

## 2017-09-03 DIAGNOSIS — J301 Allergic rhinitis due to pollen: Secondary | ICD-10-CM | POA: Diagnosis not present

## 2017-09-03 DIAGNOSIS — J3089 Other allergic rhinitis: Secondary | ICD-10-CM | POA: Diagnosis not present

## 2017-09-17 DIAGNOSIS — J3089 Other allergic rhinitis: Secondary | ICD-10-CM | POA: Diagnosis not present

## 2017-09-17 DIAGNOSIS — J3081 Allergic rhinitis due to animal (cat) (dog) hair and dander: Secondary | ICD-10-CM | POA: Diagnosis not present

## 2017-09-17 DIAGNOSIS — J301 Allergic rhinitis due to pollen: Secondary | ICD-10-CM | POA: Diagnosis not present

## 2017-09-24 DIAGNOSIS — J3089 Other allergic rhinitis: Secondary | ICD-10-CM | POA: Diagnosis not present

## 2017-09-24 DIAGNOSIS — J301 Allergic rhinitis due to pollen: Secondary | ICD-10-CM | POA: Diagnosis not present

## 2017-09-28 DIAGNOSIS — J209 Acute bronchitis, unspecified: Secondary | ICD-10-CM | POA: Diagnosis not present

## 2017-09-28 DIAGNOSIS — J301 Allergic rhinitis due to pollen: Secondary | ICD-10-CM | POA: Diagnosis not present

## 2017-09-28 DIAGNOSIS — Z91013 Allergy to seafood: Secondary | ICD-10-CM | POA: Diagnosis not present

## 2017-09-28 DIAGNOSIS — J4530 Mild persistent asthma, uncomplicated: Secondary | ICD-10-CM | POA: Diagnosis not present

## 2017-10-01 DIAGNOSIS — E785 Hyperlipidemia, unspecified: Secondary | ICD-10-CM | POA: Diagnosis not present

## 2017-10-01 DIAGNOSIS — E559 Vitamin D deficiency, unspecified: Secondary | ICD-10-CM | POA: Diagnosis not present

## 2017-10-02 DIAGNOSIS — E785 Hyperlipidemia, unspecified: Secondary | ICD-10-CM | POA: Diagnosis not present

## 2017-10-02 DIAGNOSIS — E559 Vitamin D deficiency, unspecified: Secondary | ICD-10-CM | POA: Diagnosis not present

## 2017-10-08 DIAGNOSIS — J301 Allergic rhinitis due to pollen: Secondary | ICD-10-CM | POA: Diagnosis not present

## 2017-10-08 DIAGNOSIS — J3089 Other allergic rhinitis: Secondary | ICD-10-CM | POA: Diagnosis not present

## 2017-10-12 DIAGNOSIS — J301 Allergic rhinitis due to pollen: Secondary | ICD-10-CM | POA: Diagnosis not present

## 2017-10-12 DIAGNOSIS — J3089 Other allergic rhinitis: Secondary | ICD-10-CM | POA: Diagnosis not present

## 2017-10-22 DIAGNOSIS — J301 Allergic rhinitis due to pollen: Secondary | ICD-10-CM | POA: Diagnosis not present

## 2017-10-22 DIAGNOSIS — J3089 Other allergic rhinitis: Secondary | ICD-10-CM | POA: Diagnosis not present

## 2017-10-30 DIAGNOSIS — J301 Allergic rhinitis due to pollen: Secondary | ICD-10-CM | POA: Diagnosis not present

## 2017-10-30 DIAGNOSIS — J3089 Other allergic rhinitis: Secondary | ICD-10-CM | POA: Diagnosis not present

## 2017-11-02 DIAGNOSIS — J301 Allergic rhinitis due to pollen: Secondary | ICD-10-CM | POA: Diagnosis not present

## 2017-11-02 DIAGNOSIS — J3089 Other allergic rhinitis: Secondary | ICD-10-CM | POA: Diagnosis not present

## 2017-11-03 DIAGNOSIS — Z6831 Body mass index (BMI) 31.0-31.9, adult: Secondary | ICD-10-CM | POA: Diagnosis not present

## 2017-11-03 DIAGNOSIS — N958 Other specified menopausal and perimenopausal disorders: Secondary | ICD-10-CM | POA: Diagnosis not present

## 2017-11-03 DIAGNOSIS — Z01419 Encounter for gynecological examination (general) (routine) without abnormal findings: Secondary | ICD-10-CM | POA: Diagnosis not present

## 2017-11-03 DIAGNOSIS — M816 Localized osteoporosis [Lequesne]: Secondary | ICD-10-CM | POA: Diagnosis not present

## 2017-11-06 DIAGNOSIS — J3089 Other allergic rhinitis: Secondary | ICD-10-CM | POA: Diagnosis not present

## 2017-11-11 DIAGNOSIS — J301 Allergic rhinitis due to pollen: Secondary | ICD-10-CM | POA: Diagnosis not present

## 2017-11-11 DIAGNOSIS — J3089 Other allergic rhinitis: Secondary | ICD-10-CM | POA: Diagnosis not present

## 2017-11-18 DIAGNOSIS — J301 Allergic rhinitis due to pollen: Secondary | ICD-10-CM | POA: Diagnosis not present

## 2017-11-18 DIAGNOSIS — J3089 Other allergic rhinitis: Secondary | ICD-10-CM | POA: Diagnosis not present

## 2017-11-24 DIAGNOSIS — J3089 Other allergic rhinitis: Secondary | ICD-10-CM | POA: Diagnosis not present

## 2017-11-24 DIAGNOSIS — J301 Allergic rhinitis due to pollen: Secondary | ICD-10-CM | POA: Diagnosis not present

## 2017-12-03 DIAGNOSIS — J301 Allergic rhinitis due to pollen: Secondary | ICD-10-CM | POA: Diagnosis not present

## 2017-12-03 DIAGNOSIS — J3089 Other allergic rhinitis: Secondary | ICD-10-CM | POA: Diagnosis not present

## 2017-12-11 DIAGNOSIS — J4530 Mild persistent asthma, uncomplicated: Secondary | ICD-10-CM | POA: Diagnosis not present

## 2017-12-11 DIAGNOSIS — Z91013 Allergy to seafood: Secondary | ICD-10-CM | POA: Diagnosis not present

## 2017-12-11 DIAGNOSIS — J301 Allergic rhinitis due to pollen: Secondary | ICD-10-CM | POA: Diagnosis not present

## 2017-12-11 DIAGNOSIS — J3089 Other allergic rhinitis: Secondary | ICD-10-CM | POA: Diagnosis not present

## 2017-12-17 DIAGNOSIS — J301 Allergic rhinitis due to pollen: Secondary | ICD-10-CM | POA: Diagnosis not present

## 2017-12-17 DIAGNOSIS — J3089 Other allergic rhinitis: Secondary | ICD-10-CM | POA: Diagnosis not present

## 2017-12-23 DIAGNOSIS — J301 Allergic rhinitis due to pollen: Secondary | ICD-10-CM | POA: Diagnosis not present

## 2017-12-23 DIAGNOSIS — J3089 Other allergic rhinitis: Secondary | ICD-10-CM | POA: Diagnosis not present

## 2017-12-25 DIAGNOSIS — J301 Allergic rhinitis due to pollen: Secondary | ICD-10-CM | POA: Diagnosis not present

## 2017-12-31 DIAGNOSIS — J301 Allergic rhinitis due to pollen: Secondary | ICD-10-CM | POA: Diagnosis not present

## 2017-12-31 DIAGNOSIS — J3089 Other allergic rhinitis: Secondary | ICD-10-CM | POA: Diagnosis not present

## 2018-01-06 DIAGNOSIS — J301 Allergic rhinitis due to pollen: Secondary | ICD-10-CM | POA: Diagnosis not present

## 2018-01-06 DIAGNOSIS — J3089 Other allergic rhinitis: Secondary | ICD-10-CM | POA: Diagnosis not present

## 2018-01-14 DIAGNOSIS — J3089 Other allergic rhinitis: Secondary | ICD-10-CM | POA: Diagnosis not present

## 2018-01-14 DIAGNOSIS — J301 Allergic rhinitis due to pollen: Secondary | ICD-10-CM | POA: Diagnosis not present

## 2018-01-21 DIAGNOSIS — J3089 Other allergic rhinitis: Secondary | ICD-10-CM | POA: Diagnosis not present

## 2018-01-21 DIAGNOSIS — J301 Allergic rhinitis due to pollen: Secondary | ICD-10-CM | POA: Diagnosis not present

## 2018-01-28 DIAGNOSIS — J301 Allergic rhinitis due to pollen: Secondary | ICD-10-CM | POA: Diagnosis not present

## 2018-01-28 DIAGNOSIS — J3089 Other allergic rhinitis: Secondary | ICD-10-CM | POA: Diagnosis not present

## 2018-02-03 DIAGNOSIS — J3089 Other allergic rhinitis: Secondary | ICD-10-CM | POA: Diagnosis not present

## 2018-02-03 DIAGNOSIS — J301 Allergic rhinitis due to pollen: Secondary | ICD-10-CM | POA: Diagnosis not present

## 2018-02-11 DIAGNOSIS — J3089 Other allergic rhinitis: Secondary | ICD-10-CM | POA: Diagnosis not present

## 2018-02-11 DIAGNOSIS — J301 Allergic rhinitis due to pollen: Secondary | ICD-10-CM | POA: Diagnosis not present

## 2018-02-17 DIAGNOSIS — J3089 Other allergic rhinitis: Secondary | ICD-10-CM | POA: Diagnosis not present

## 2018-02-17 DIAGNOSIS — J301 Allergic rhinitis due to pollen: Secondary | ICD-10-CM | POA: Diagnosis not present

## 2018-02-25 DIAGNOSIS — J301 Allergic rhinitis due to pollen: Secondary | ICD-10-CM | POA: Diagnosis not present

## 2018-02-25 DIAGNOSIS — J3089 Other allergic rhinitis: Secondary | ICD-10-CM | POA: Diagnosis not present

## 2018-03-02 DIAGNOSIS — J301 Allergic rhinitis due to pollen: Secondary | ICD-10-CM | POA: Diagnosis not present

## 2018-03-02 DIAGNOSIS — J3089 Other allergic rhinitis: Secondary | ICD-10-CM | POA: Diagnosis not present

## 2018-03-12 DIAGNOSIS — J301 Allergic rhinitis due to pollen: Secondary | ICD-10-CM | POA: Diagnosis not present

## 2018-03-12 DIAGNOSIS — J3089 Other allergic rhinitis: Secondary | ICD-10-CM | POA: Diagnosis not present

## 2018-03-19 DIAGNOSIS — J3089 Other allergic rhinitis: Secondary | ICD-10-CM | POA: Diagnosis not present

## 2018-03-19 DIAGNOSIS — J301 Allergic rhinitis due to pollen: Secondary | ICD-10-CM | POA: Diagnosis not present

## 2018-04-01 ENCOUNTER — Ambulatory Visit (INDEPENDENT_AMBULATORY_CARE_PROVIDER_SITE_OTHER): Payer: PPO | Admitting: Internal Medicine

## 2018-04-01 ENCOUNTER — Ambulatory Visit (INDEPENDENT_AMBULATORY_CARE_PROVIDER_SITE_OTHER): Payer: PPO

## 2018-04-01 ENCOUNTER — Encounter: Payer: Self-pay | Admitting: Internal Medicine

## 2018-04-01 VITALS — BP 160/90 | HR 71 | Temp 98.3°F | Ht <= 58 in | Wt 150.2 lb

## 2018-04-01 VITALS — BP 142/70 | HR 71 | Temp 98.3°F | Ht <= 58 in | Wt 150.2 lb

## 2018-04-01 DIAGNOSIS — Z0000 Encounter for general adult medical examination without abnormal findings: Secondary | ICD-10-CM | POA: Diagnosis not present

## 2018-04-01 DIAGNOSIS — J3089 Other allergic rhinitis: Secondary | ICD-10-CM | POA: Diagnosis not present

## 2018-04-01 DIAGNOSIS — J301 Allergic rhinitis due to pollen: Secondary | ICD-10-CM | POA: Diagnosis not present

## 2018-04-01 DIAGNOSIS — J4520 Mild intermittent asthma, uncomplicated: Secondary | ICD-10-CM | POA: Insufficient documentation

## 2018-04-01 DIAGNOSIS — R030 Elevated blood-pressure reading, without diagnosis of hypertension: Secondary | ICD-10-CM

## 2018-04-01 DIAGNOSIS — Z79899 Other long term (current) drug therapy: Secondary | ICD-10-CM | POA: Diagnosis not present

## 2018-04-01 DIAGNOSIS — E7800 Pure hypercholesterolemia, unspecified: Secondary | ICD-10-CM | POA: Insufficient documentation

## 2018-04-01 DIAGNOSIS — E559 Vitamin D deficiency, unspecified: Secondary | ICD-10-CM | POA: Insufficient documentation

## 2018-04-01 DIAGNOSIS — M810 Age-related osteoporosis without current pathological fracture: Secondary | ICD-10-CM | POA: Insufficient documentation

## 2018-04-01 DIAGNOSIS — J45909 Unspecified asthma, uncomplicated: Secondary | ICD-10-CM | POA: Insufficient documentation

## 2018-04-01 MED ORDER — CETIRIZINE HCL 10 MG PO TABS: 10.0000 mg | ORAL_TABLET | Freq: Every day | ORAL | 0 refills | Status: DC

## 2018-04-01 MED ORDER — MONTELUKAST SODIUM 10 MG PO TABS: 10.0000 mg | ORAL_TABLET | Freq: Every day | ORAL | 0 refills | Status: DC

## 2018-04-01 NOTE — Progress Notes (Signed)
Subjective:   Marisue Canion Hiser is a 72 y.o. female who presents for Medicare Annual (Subsequent) preventive examination.  Review of Systems:  n/a Cardiac Risk Factors include: advanced age (>107men, >42 women);obesity (BMI >30kg/m2)     Objective:     Vitals: BP (!) 142/70 (BP Location: Left Arm)   Pulse 71   Temp 98.3 F (36.8 C)   Ht 4\' 9"  (1.448 m)   Wt 150 lb 3.2 oz (68.1 kg)   BMI 32.50 kg/m   Body mass index is 32.5 kg/m.  Advanced Directives 04/01/2018  Does Patient Have a Medical Advance Directive? No  Would patient like information on creating a medical advance directive? No - Patient declined    Tobacco Social History   Tobacco Use  Smoking Status Never Smoker  Smokeless Tobacco Never Used     Counseling given: Not Answered   Clinical Intake:  Pre-visit preparation completed: Yes  Pain : No/denies pain     Nutritional Status: BMI > 30  Obese Nutritional Risks: None Diabetes: No  How often do you need to have someone help you when you read instructions, pamphlets, or other written materials from your doctor or pharmacy?: 1 - Never What is the last grade level you completed in school?: 12th grade  Interpreter Needed?: No  Information entered by :: NAllen LPN  Past Medical History:  Diagnosis Date  . Asthma    History reviewed. No pertinent surgical history. History reviewed. No pertinent family history. Social History   Socioeconomic History  . Marital status: Married    Spouse name: Not on file  . Number of children: Not on file  . Years of education: Not on file  . Highest education level: Not on file  Occupational History  . Occupation: retired  Scientific laboratory technician  . Financial resource strain: Not hard at all  . Food insecurity:    Worry: Never true    Inability: Never true  . Transportation needs:    Medical: No    Non-medical: No  Tobacco Use  . Smoking status: Never Smoker  . Smokeless tobacco: Never Used  Substance and Sexual  Activity  . Alcohol use: No  . Drug use: No  . Sexual activity: Yes  Lifestyle  . Physical activity:    Days per week: 7 days    Minutes per session: 60 min  . Stress: Not at all  Relationships  . Social connections:    Talks on phone: Not on file    Gets together: Not on file    Attends religious service: Not on file    Active member of club or organization: Not on file    Attends meetings of clubs or organizations: Not on file    Relationship status: Not on file  Other Topics Concern  . Not on file  Social History Narrative  . Not on file    Outpatient Encounter Medications as of 04/01/2018  Medication Sig  . albuterol (PROVENTIL HFA;VENTOLIN HFA) 108 (90 Base) MCG/ACT inhaler Inhale 2 puffs into the lungs every 4 (four) hours as needed for wheezing or shortness of breath.  . calcium citrate (CALCITRATE - DOSED IN MG ELEMENTAL CALCIUM) 950 MG tablet Take 200 mg of elemental calcium by mouth daily.  . fluticasone furoate-vilanterol (BREO ELLIPTA) 100-25 MCG/INH AEPB Inhale 1 puff into the lungs daily.  Marland Kitchen ibandronate (BONIVA) 150 MG tablet TAKE 1 TABLET ORALLY ONCE MONTHLY WITH 8-10 OZ WATER. SIT UPRIGHT AND NO FOOD/DRINK FOR 1 HOUR.  Marland Kitchen  multivitamin-iron-minerals-folic acid (CENTRUM) chewable tablet Chew 1 tablet by mouth daily.   No facility-administered encounter medications on file as of 04/01/2018.     Activities of Daily Living In your present state of health, do you have any difficulty performing the following activities: 04/01/2018  Hearing? N  Vision? N  Difficulty concentrating or making decisions? N  Walking or climbing stairs? Y  Comment right hip pain at times  Dressing or bathing? N  Doing errands, shopping? N  Preparing Food and eating ? N  Using the Toilet? N  In the past six months, have you accidently leaked urine? N  Do you have problems with loss of bowel control? N  Managing your Medications? N  Managing your Finances? N  Housekeeping or managing your  Housekeeping? N  Some recent data might be hidden    Patient Care Team: [name], FNP as PCP - General (General Practice)    Assessment:   This is a routine wellness examination for Rachelann.  Exercise Activities and Dietary recommendations Current Exercise Habits: Home exercise routine, Type of exercise: walking, Time (Minutes): 60, Frequency (Times/Week): 7, Weekly Exercise (Minutes/Week): 420, Intensity: Moderate, Exercise limited by: None identified  Goals    . Weight (lb) < 200 lb (90.7 kg) (pt-stated)     Wants to lose 15 pounds       Fall Risk Fall Risk  04/01/2018  Falls in the past year? 0   Is the patient's home free of loose throw rugs in walkways, pet beds, electrical cords, etc?   yes      Grab bars in the bathroom? no      Handrails on the stairs?   yes      Adequate lighting?   yes  Timed Get Up and Go performed: n/a  Depression Screen PHQ 2/9 Scores 04/01/2018  PHQ - 2 Score 0  PHQ- 9 Score 0     Cognitive Function     6CIT Screen 04/01/2018  What Year? 0 points  What month? 0 points  What time? 0 points  Count back from 20 0 points  Months in reverse 0 points  Repeat phrase 2 points  Total Score 2    Immunization History  Administered Date(s) Administered  . Influenza, High Dose Seasonal PF 02/15/2018  . Tdap 02/15/2018  . Zoster Recombinat (Shingrix) 08/12/2017, 12/28/2017    Qualifies for Shingles Vaccine? yes  Screening Tests Health Maintenance  Topic Date Due  . Hepatitis C Screening  02/11/1946  . DEXA SCAN  06/08/2009  . PNA vac Low Risk Adult (2 of 2 - PPSV23) 03/16/2015  . MAMMOGRAM  06/09/2019  . COLONOSCOPY  12/10/2024  . TETANUS/TDAP  02/16/2028  . INFLUENZA VACCINE  Completed    Cancer Screenings: Lung: Low Dose CT Chest recommended if Age 2-80 years, 30 pack-year currently smoking OR have quit w/in 15years. Patient does not qualify. Breast:  Up to date on Mammogram? Yes   Up to date of Bone Density/Dexa?  Yes Colorectal: up to date  Additional Screenings: : Hepatitis C Screening: due     Plan:   Patient would like to lose 15 pounds. States that she had BMD in June when she started taking Boniva.  I have personally reviewed and noted the following in the patient's chart:   . Medical and social history . Use of alcohol, tobacco or illicit drugs  . Current medications and supplements . Functional ability and status . Nutritional status . Physical activity . Advanced directives .  List of other physicians . Hospitalizations, surgeries, and ER visits in previous 12 months . Vitals . Screenings to include cognitive, depression, and falls . Referrals and appointments  In addition, I have reviewed and discussed with patient certain preventive protocols, quality metrics, and best practice recommendations. A written personalized care plan for preventive services as well as general preventive health recommendations were provided to patient.     Kellie Simmering, LPN  99/07/7167

## 2018-04-01 NOTE — Progress Notes (Signed)
Subjective:     Patient ID: Linda Moreno , female    DOB: [birth year] , 72 y.o.   MRN: [id number]   CC- HTN  HPI Her R hip gets more achy specially.  Asthma is stable and continues getting allergy shots. BP at home in the 144'R systolically. In general has been doing well for exception of having hip aches from her arthritis specially when it gets cold.   Past Medical History:  Diagnosis Date  . Asthma      No family history on file.   Current Outpatient Medications:  .  albuterol (PROVENTIL HFA;VENTOLIN HFA) 108 (90 Base) MCG/ACT inhaler, Inhale 2 puffs into the lungs every 4 (four) hours as needed for wheezing or shortness of breath., Disp: , Rfl:  .  calcium citrate (CALCITRATE - DOSED IN MG ELEMENTAL CALCIUM) 950 MG tablet, Take 200 mg of elemental calcium by mouth daily., Disp: , Rfl:  .  fluticasone furoate-vilanterol (BREO ELLIPTA) 100-25 MCG/INH AEPB, Inhale 1 puff into the lungs daily., Disp: , Rfl:  .  ibandronate (BONIVA) 150 MG tablet, TAKE 1 TABLET ORALLY ONCE MONTHLY WITH 8-10 OZ WATER. SIT UPRIGHT AND NO FOOD/DRINK FOR 1 HOUR., Disp: , Rfl: 11 .  multivitamin-iron-minerals-folic acid (CENTRUM) chewable tablet, Chew 1 tablet by mouth daily., Disp: , Rfl:    No Known Allergies   Review of Systems  Constitutional: Negative for appetite change, chills, diaphoresis and fever.  HENT: Positive for postnasal drip and rhinorrhea. Negative for tinnitus.   Respiratory: Negative for cough, shortness of breath and wheezing.   Cardiovascular: Negative for chest pain, palpitations and leg swelling.  Gastrointestinal: Negative for constipation and diarrhea.  Genitourinary: Negative for dysuria and frequency.  Skin: Negative for rash.  Neurological: Negative for dizziness, light-headedness and headaches.     Today's Vitals   04/01/18 1029  BP: (!) 142/70  Pulse: 71  Temp: 98.3 F (36.8 C)  TempSrc: Oral  Weight: 150 lb 3.2 oz (68.1 kg)  Height: 4\' 9"  (1.448 m)   Body mass  index is 32.5 kg/m.   Objective:  Physical Exam  Constitutional: She is oriented to person, place, and time. She appears well-developed and well-nourished. No distress.  HENT:  Head: Normocephalic and atraumatic.  Right Ear: External ear normal.  Left Ear: External ear normal.  Nose: Nose normal.  Eyes: Conjunctivae are normal. Right eye exhibits no discharge. Left eye exhibits no discharge. No scleral icterus.  Neck: Neck supple. No thyromegaly present.  No carotid bruits bilaterally  Cardiovascular: Normal rate and regular rhythm.  No murmur heard. Pulmonary/Chest: Effort normal and breath sounds normal. No respiratory distress.  Musculoskeletal: Normal range of motion. She exhibits no edema.  Lymphadenopathy:    She has no cervical adenopathy.  Neurological: She is alert and oriented to person, place, and time.  Skin: Skin is warm and dry. Capillary refill takes less than 2 seconds. No rash noted. She is not diaphoretic.  Psychiatric: She has a normal mood and affect. Her behavior is normal. Judgment and thought content normal.  Nursing note reviewed. Assessment And Plan:    1. Elevated blood pressure reading- advised to do BP diaries and call us next week with readings. She states if her congestion acts up, her BP goes up.  - CMP14 + Anion Gap - CBC no Diff - Lipid Profile - TSH - T4, Free - T3, free  2. Encounter for long-term current use of medication- chronic - CMP14 + Anion Gap - CBC no  Diff - TSH - T4, Free - T3, free  May continue current meds. Needs to call us with BP readings next week. FU in 3 months.   Shenique Childers RODRIGUEZ-SOUTHWORTH, PA-C

## 2018-04-01 NOTE — Patient Instructions (Signed)
Linda Moreno , Thank you for taking time to come for your Medicare Wellness Visit. I appreciate your ongoing commitment to your health goals. Please review the following plan we discussed and let me know if I can assist you in the future.   Screening recommendations/referrals: Colonoscopy: 11/2014 Mammogram: 05/2017 Bone Density: 10/2017 Recommended yearly ophthalmology/optometry visit for glaucoma screening and checkup Recommended yearly dental visit for hygiene and checkup  Vaccinations: Influenza vaccine: 01/2018 Pneumococcal vaccine: 02/2014 Tdap vaccine: 01/2018 Shingles vaccine: 12/2017    Advanced directives: Advance directive discussed with you today. Even though you declined this today please call our office should you change your mind and we can give you the proper paperwork for you to fill out.   Conditions/risks identified: Obesity: Patient wants to lose 15 pounds. States exercises 7 days a week for an hour.  Next appointment: 09/29/2018 at [facility] 65 Years and Older, Female Preventive care refers to lifestyle choices and visits with your health care provider that can promote health and wellness. What does preventive care include?  A yearly physical exam. This is also called an annual well check.  Dental exams once or twice a year.  Routine eye exams. Ask your health care provider how often you should have your eyes checked.  Personal lifestyle choices, including:  Daily care of your teeth and gums.  Regular physical activity.  Eating a healthy diet.  Avoiding tobacco and drug use.  Limiting alcohol use.  Practicing safe sex.  Taking low-dose aspirin every day.  Taking vitamin and mineral supplements as recommended by your health care provider. What happens during an annual well check? The services and screenings done by your health care provider during your annual well check will depend on your age, overall health, lifestyle risk factors, and  family history of disease. Counseling  Your health care provider may ask you questions about your:  Alcohol use.  Tobacco use.  Drug use.  Emotional well-being.  Home and relationship well-being.  Sexual activity.  Eating habits.  History of falls.  Memory and ability to understand (cognition).  Work and work Statistician.  Reproductive health. Screening  You may have the following tests or measurements:  Height, weight, and BMI.  Blood pressure.  Lipid and cholesterol levels. These may be checked every 5 years, or more frequently if you are over 33 years old.  Skin check.  Lung cancer screening. You may have this screening every year starting at age 46 if you have a 30-pack-year history of smoking and currently smoke or have quit within the past 15 years.  Fecal occult blood test (FOBT) of the stool. You may have this test every year starting at age 77.  Flexible sigmoidoscopy or colonoscopy. You may have a sigmoidoscopy every 5 years or a colonoscopy every 10 years starting at age 71.  Hepatitis C blood test.  Hepatitis B blood test.  Sexually transmitted disease (STD) testing.  Diabetes screening. This is done by checking your blood sugar (glucose) after you have not eaten for a while (fasting). You may have this done every 1-3 years.  Bone density scan. This is done to screen for osteoporosis. You may have this done starting at age 31.  Mammogram. This may be done every 1-2 years. Talk to your health care provider about how often you should have regular mammograms. Talk with your health care provider about your test results, treatment options, and if necessary, the need for more tests. Vaccines  Your health  care provider may recommend certain vaccines, such as:  Influenza vaccine. This is recommended every year.  Tetanus, diphtheria, and acellular pertussis (Tdap, Td) vaccine. You may need a Td booster every 10 years.  Zoster vaccine. You may need this  after age 22.  Pneumococcal 13-valent conjugate (PCV13) vaccine. One dose is recommended after age 52.  Pneumococcal polysaccharide (PPSV23) vaccine. One dose is recommended after age 9. Talk to your health care provider about which screenings and vaccines you need and how often you need them. This information is not intended to replace advice given to you by your health care provider. Make sure you discuss any questions you have with your health care provider. Document Released: 06/08/2015 Document Revised: 01/30/2016 Document Reviewed: 03/13/2015 Elsevier Interactive Patient Education  2017 [place] Prevention in the Home Falls can cause injuries. They can happen to people of all ages. There are many things you can do to make your home safe and to help prevent falls. What can I do on the outside of my home?  Regularly fix the edges of walkways and driveways and fix any cracks.  Remove anything that might make you trip as you walk through a door, such as a raised step or threshold.  Trim any bushes or trees on the path to your home.  Use bright outdoor lighting.  Clear any walking paths of anything that might make someone trip, such as rocks or tools.  Regularly check to see if handrails are loose or broken. Make sure that both sides of any steps have handrails.  Any raised decks and porches should have guardrails on the edges.  Have any leaves, snow, or ice cleared regularly.  Use sand or salt on walking paths during winter.  Clean up any spills in your garage right away. This includes oil or grease spills. What can I do in the bathroom?  Use night lights.  Install grab bars by the toilet and in the tub and shower. Do not use towel bars as grab bars.  Use non-skid mats or decals in the tub or shower.  If you need to sit down in the shower, use a plastic, non-slip stool.  Keep the floor dry. Clean up any water that spills on the floor as soon as it  happens.  Remove soap buildup in the tub or shower regularly.  Attach bath mats securely with double-sided non-slip rug tape.  Do not have throw rugs and other things on the floor that can make you trip. What can I do in the bedroom?  Use night lights.  Make sure that you have a light by your bed that is easy to reach.  Do not use any sheets or blankets that are too big for your bed. They should not hang down onto the floor.  Have a firm chair that has side arms. You can use this for support while you get dressed.  Do not have throw rugs and other things on the floor that can make you trip. What can I do in the kitchen?  Clean up any spills right away.  Avoid walking on wet floors.  Keep items that you use a lot in easy-to-reach places.  If you need to reach something above you, use a strong step stool that has a grab bar.  Keep electrical cords out of the way.  Do not use floor polish or wax that makes floors slippery. If you must use wax, use non-skid floor wax.  Do not have  throw rugs and other things on the floor that can make you trip. What can I do with my stairs?  Do not leave any items on the stairs.  Make sure that there are handrails on both sides of the stairs and use them. Fix handrails that are broken or loose. Make sure that handrails are as long as the stairways.  Check any carpeting to make sure that it is firmly attached to the stairs. Fix any carpet that is loose or worn.  Avoid having throw rugs at the top or bottom of the stairs. If you do have throw rugs, attach them to the floor with carpet tape.  Make sure that you have a light switch at the top of the stairs and the bottom of the stairs. If you do not have them, ask someone to add them for you. What else can I do to help prevent falls?  Wear shoes that:  Do not have high heels.  Have rubber bottoms.  Are comfortable and fit you well.  Are closed at the toe. Do not wear sandals.  If you  use a stepladder:  Make sure that it is fully opened. Do not climb a closed stepladder.  Make sure that both sides of the stepladder are locked into place.  Ask someone to hold it for you, if possible.  Clearly mark and make sure that you can see:  Any grab bars or handrails.  First and last steps.  Where the edge of each step is.  Use tools that help you move around (mobility aids) if they are needed. These include:  Canes.  Walkers.  Scooters.  Crutches.  Turn on the lights when you go into a dark area. Replace any light bulbs as soon as they burn out.  Set up your furniture so you have a clear path. Avoid moving your furniture around.  If any of your floors are uneven, fix them.  If there are any pets around you, be aware of where they are.  Review your medicines with your doctor. Some medicines can make you feel dizzy. This can increase your chance of falling. Ask your doctor what other things that you can do to help prevent falls. This information is not intended to replace advice given to you by your health care provider. Make sure you discuss any questions you have with your health care provider. Document Released: 03/08/2009 Document Revised: 10/18/2015 Document Reviewed: 06/16/2014 Elsevier Interactive Patient Education  2017 [employer].

## 2018-04-01 NOTE — Patient Instructions (Signed)
Your blood pressure is not looking well. Please do blood pressure diaries this week in the am  and call me with those numbers on Tuesday.

## 2018-04-02 LAB — CBC
Hematocrit: 39.3 % (ref 34.0–46.6)
Hemoglobin: 13.5 g/dL (ref 11.1–15.9)
MCH: 29.7 pg (ref 26.6–33.0)
MCHC: 34.4 g/dL (ref 31.5–35.7)
MCV: 87 fL (ref 79–97)
Platelets: 267 10*3/uL (ref 150–450)
RBC: 4.54 x10E6/uL (ref 3.77–5.28)
RDW: 12 % — ABNORMAL LOW (ref 12.3–15.4)
WBC: 4.8 10*3/uL (ref 3.4–10.8)

## 2018-04-02 LAB — CMP14 + ANION GAP
ALT: 13 IU/L (ref 0–32)
AST: 22 IU/L (ref 0–40)
Albumin/Globulin Ratio: 1.3 (ref 1.2–2.2)
Albumin: 4.4 g/dL (ref 3.5–4.8)
Alkaline Phosphatase: 99 IU/L (ref 39–117)
Anion Gap: 16 mmol/L (ref 10.0–18.0)
BUN/Creatinine Ratio: 19 (ref 12–28)
BUN: 15 mg/dL (ref 8–27)
Bilirubin Total: 0.3 mg/dL (ref 0.0–1.2)
CO2: 24 mmol/L (ref 20–29)
Calcium: 9.8 mg/dL (ref 8.7–10.3)
Chloride: 100 mmol/L (ref 96–106)
Creatinine, Ser: 0.79 mg/dL (ref 0.57–1.00)
GFR calc Af Amer: 86 mL/min/{1.73_m2} (ref >59)
GFR calc non Af Amer: 74 mL/min/{1.73_m2} (ref >59)
Globulin, Total: 3.4 g/dL (ref 1.5–4.5)
Glucose: 91 mg/dL (ref 65–99)
Potassium: 4.3 mmol/L (ref 3.5–5.2)
Sodium: 140 mmol/L (ref 134–144)
Total Protein: 7.8 g/dL (ref 6.0–8.5)

## 2018-04-02 LAB — LIPID PANEL
Chol/HDL Ratio: 2.8 ratio (ref 0.0–4.4)
Cholesterol, Total: 226 mg/dL — ABNORMAL HIGH (ref 100–199)
HDL: 82 mg/dL (ref >39)
LDL Calculated: 130 mg/dL — ABNORMAL HIGH (ref 0–99)
Triglycerides: 71 mg/dL (ref 0–149)
VLDL Cholesterol Cal: 14 mg/dL (ref 5–40)

## 2018-04-02 LAB — TSH: TSH: 1.35 u[IU]/mL (ref 0.450–4.500)

## 2018-04-02 LAB — T3, FREE: T3, Free: 3.2 pg/mL (ref 2.0–4.4)

## 2018-04-02 LAB — T4, FREE: Free T4: 1.17 ng/dL (ref 0.82–1.77)

## 2018-04-08 ENCOUNTER — Ambulatory Visit: Payer: Self-pay | Admitting: Nurse Practitioner

## 2018-04-08 DIAGNOSIS — J301 Allergic rhinitis due to pollen: Secondary | ICD-10-CM | POA: Diagnosis not present

## 2018-04-08 DIAGNOSIS — J3089 Other allergic rhinitis: Secondary | ICD-10-CM | POA: Diagnosis not present

## 2018-04-30 ENCOUNTER — Other Ambulatory Visit: Payer: Self-pay | Admitting: Nurse Practitioner

## 2018-04-30 DIAGNOSIS — Z91013 Allergy to seafood: Secondary | ICD-10-CM | POA: Diagnosis not present

## 2018-04-30 DIAGNOSIS — J4530 Mild persistent asthma, uncomplicated: Secondary | ICD-10-CM | POA: Diagnosis not present

## 2018-04-30 DIAGNOSIS — J301 Allergic rhinitis due to pollen: Secondary | ICD-10-CM | POA: Diagnosis not present

## 2018-04-30 DIAGNOSIS — Z1231 Encounter for screening mammogram for malignant neoplasm of breast: Secondary | ICD-10-CM

## 2018-04-30 DIAGNOSIS — J209 Acute bronchitis, unspecified: Secondary | ICD-10-CM | POA: Diagnosis not present

## 2018-04-30 DIAGNOSIS — J019 Acute sinusitis, unspecified: Secondary | ICD-10-CM | POA: Diagnosis not present

## 2018-05-12 DIAGNOSIS — J3089 Other allergic rhinitis: Secondary | ICD-10-CM | POA: Diagnosis not present

## 2018-05-12 DIAGNOSIS — J301 Allergic rhinitis due to pollen: Secondary | ICD-10-CM | POA: Diagnosis not present

## 2018-05-20 DIAGNOSIS — J3089 Other allergic rhinitis: Secondary | ICD-10-CM | POA: Diagnosis not present

## 2018-05-20 DIAGNOSIS — J301 Allergic rhinitis due to pollen: Secondary | ICD-10-CM | POA: Diagnosis not present

## 2018-05-27 DIAGNOSIS — J3089 Other allergic rhinitis: Secondary | ICD-10-CM | POA: Diagnosis not present

## 2018-05-27 DIAGNOSIS — J301 Allergic rhinitis due to pollen: Secondary | ICD-10-CM | POA: Diagnosis not present

## 2018-06-02 DIAGNOSIS — J3089 Other allergic rhinitis: Secondary | ICD-10-CM | POA: Diagnosis not present

## 2018-06-02 DIAGNOSIS — J301 Allergic rhinitis due to pollen: Secondary | ICD-10-CM | POA: Diagnosis not present

## 2018-06-10 ENCOUNTER — Ambulatory Visit
Admission: RE | Admit: 2018-06-10 | Discharge: 2018-06-10 | Disposition: A | Discharge status: Home / Self Care | Payer: PPO | Source: Ambulatory Visit | Attending: Nurse Practitioner | Admitting: Nurse Practitioner

## 2018-06-10 DIAGNOSIS — Z1231 Encounter for screening mammogram for malignant neoplasm of breast: Secondary | ICD-10-CM | POA: Diagnosis not present

## 2018-06-11 DIAGNOSIS — J301 Allergic rhinitis due to pollen: Secondary | ICD-10-CM | POA: Diagnosis not present

## 2018-06-11 DIAGNOSIS — J3089 Other allergic rhinitis: Secondary | ICD-10-CM | POA: Diagnosis not present

## 2018-06-14 DIAGNOSIS — Z91013 Allergy to seafood: Secondary | ICD-10-CM | POA: Diagnosis not present

## 2018-06-14 DIAGNOSIS — J4530 Mild persistent asthma, uncomplicated: Secondary | ICD-10-CM | POA: Diagnosis not present

## 2018-06-14 DIAGNOSIS — J019 Acute sinusitis, unspecified: Secondary | ICD-10-CM | POA: Diagnosis not present

## 2018-06-14 DIAGNOSIS — J209 Acute bronchitis, unspecified: Secondary | ICD-10-CM | POA: Diagnosis not present

## 2018-06-18 ENCOUNTER — Telehealth: Payer: Self-pay

## 2018-06-18 NOTE — Telephone Encounter (Signed)
Called pt to see if she was still seeing [name] FNP-BC as her pcp due to [place] receiving notes from [facility]  and [facility] being listed as her pcp. She stated she goes to [facility] for her allergy shots. YRL,RMA

## 2018-06-21 DIAGNOSIS — J3089 Other allergic rhinitis: Secondary | ICD-10-CM | POA: Diagnosis not present

## 2018-06-21 DIAGNOSIS — J301 Allergic rhinitis due to pollen: Secondary | ICD-10-CM | POA: Diagnosis not present

## 2018-06-24 ENCOUNTER — Ambulatory Visit (INDEPENDENT_AMBULATORY_CARE_PROVIDER_SITE_OTHER): Payer: PPO | Admitting: Nurse Practitioner

## 2018-06-24 ENCOUNTER — Encounter: Payer: Self-pay | Admitting: Nurse Practitioner

## 2018-06-24 ENCOUNTER — Ambulatory Visit
Admission: RE | Admit: 2018-06-24 | Discharge: 2018-06-24 | Disposition: A | Discharge status: Home / Self Care | Payer: PPO | Source: Ambulatory Visit | Attending: Nurse Practitioner | Admitting: Nurse Practitioner

## 2018-06-24 VITALS — BP 120/84 | HR 88 | Temp 98.2°F | Ht <= 58 in | Wt 146.0 lb

## 2018-06-24 DIAGNOSIS — J4541 Moderate persistent asthma with (acute) exacerbation: Secondary | ICD-10-CM

## 2018-06-24 DIAGNOSIS — R05 Cough: Secondary | ICD-10-CM | POA: Diagnosis not present

## 2018-06-24 MED ORDER — TRIAMCINOLONE ACETONIDE 40 MG/ML IJ SUSP
40.0000 mg | Freq: Once | INTRAMUSCULAR | Status: AC
  Administered 2018-06-24: 40 mg via INTRAMUSCULAR

## 2018-06-24 MED ORDER — PREDNISONE 20 MG PO TABS: ORAL_TABLET | ORAL | 0 refills | Status: DC

## 2018-06-24 MED ORDER — IPRATROPIUM-ALBUTEROL 0.5-2.5 (3) MG/3ML IN SOLN
3.0000 mL | Freq: Once | RESPIRATORY_TRACT | Status: AC
  Administered 2018-06-24: 3 mL via RESPIRATORY_TRACT

## 2018-06-24 NOTE — Progress Notes (Signed)
Subjective:     Patient ID: Linda Moreno , female    DOB: Jan 29, 1946 , 73 y.o.   MRN: [id number]   Chief Complaint  Patient presents with  . Asthma    patient is wheezing and she is having some coughing for the past two weeks and was given medicine and an injection from her asthma doctor but she did not clear up.     HPI  She had a respiratory infection 2 weeks ago treated with antibiotic and given additional medicine 1 week ago.  Treated with azithromycin.    Asthma  She complains of cough, shortness of breath and wheezing. There is no difficulty breathing. This is a recurrent problem. The current episode started 1 to 4 weeks ago. The problem occurs constantly. The problem has been gradually worsening. Pertinent negatives include no appetite change, chest pain, headaches, nasal congestion or sore throat. Her symptoms are aggravated by URI. Her symptoms are alleviated by leukotriene antagonist. She reports moderate improvement on treatment. Her symptoms are not alleviated by ipratropium. There are no known risk factors for lung disease. Her past medical history is significant for asthma. There is no history of pneumonia.     Past Medical History:  Diagnosis Date  . Asthma      Family History  Problem Relation Age of Onset  . Diabetes Father      Current Outpatient Medications:  .  albuterol (PROVENTIL HFA;VENTOLIN HFA) 108 (90 Base) MCG/ACT inhaler, Inhale 2 puffs into the lungs every 4 (four) hours as needed for wheezing or shortness of breath., Disp: , Rfl:  .  calcium citrate (CALCITRATE - DOSED IN MG ELEMENTAL CALCIUM) 950 MG tablet, Take 200 mg of elemental calcium by mouth daily., Disp: , Rfl:  .  cetirizine (ZYRTEC ALLERGY) 10 MG tablet, Take 1 tablet (10 mg total) by mouth daily., Disp: 1 tablet, Rfl: 0 .  fluticasone furoate-vilanterol (BREO ELLIPTA) 100-25 MCG/INH AEPB, Inhale 1 puff into the lungs daily., Disp: , Rfl:  .  ibandronate (BONIVA) 150 MG tablet, TAKE 1 TABLET  ORALLY ONCE MONTHLY WITH 8-10 OZ WATER. SIT UPRIGHT AND NO FOOD/DRINK FOR 1 HOUR., Disp: , Rfl: 11 .  montelukast (SINGULAIR) 10 MG tablet, Take 1 tablet (10 mg total) by mouth daily., Disp: 1 tablet, Rfl: 0 .  multivitamin-iron-minerals-folic acid (CENTRUM) chewable tablet, Chew 1 tablet by mouth daily., Disp: , Rfl:    No Known Allergies   Review of Systems  Constitutional: Negative for appetite change and fatigue.  HENT: Negative for sore throat.   Respiratory: Positive for cough, shortness of breath and wheezing.   Cardiovascular: Negative for chest pain, palpitations and leg swelling.  Gastrointestinal: Negative for nausea.  Neurological: Negative for headaches.     Today's Vitals   06/24/18 1428  BP: 120/84  Pulse: 88  Temp: 98.2 F (36.8 C)  TempSrc: Oral  SpO2: 95%  Weight: 146 lb (66.2 kg)  Height: 4\' 9"  (1.448 m)  PainSc: 0-No pain   Body mass index is 31.59 kg/m.   Objective:  Physical Exam Constitutional:      Appearance: Normal appearance.  Cardiovascular:     Rate and Rhythm: Normal rate and regular rhythm.     Pulses: Normal pulses.     Heart sounds: Normal heart sounds. No murmur.  Pulmonary:     Effort: Respiratory distress (slightly labored) present.     Breath sounds: Wheezing present.     Comments: Slightly course breath sounds Neurological:  Mental Status: She is alert.         Assessment And Plan:     1. Moderate persistent asthma with acute exacerbation  duoneb given with improvement in her breath sounds slightly more clear but still wheezing also breathing easier after duoneb.  Also given kenalog 40 mg IM  Will also treat with prednisone 40mg  (daily) for 3 days.  Pending CXR results will consider a cough suppressant however I have advised her to take use her proair 3 times a day for the next 4 days. - ipratropium-albuterol (DUONEB) 0.5-2.5 (3) MG/3ML nebulizer solution 3 mL - triamcinolone acetonide (KENALOG-40) injection 40 mg -  DG Chest 2 View; Future      [name], FNP

## 2018-06-29 DIAGNOSIS — J3089 Other allergic rhinitis: Secondary | ICD-10-CM | POA: Diagnosis not present

## 2018-06-29 DIAGNOSIS — J301 Allergic rhinitis due to pollen: Secondary | ICD-10-CM | POA: Diagnosis not present

## 2018-07-09 DIAGNOSIS — J3089 Other allergic rhinitis: Secondary | ICD-10-CM | POA: Diagnosis not present

## 2018-07-09 DIAGNOSIS — J301 Allergic rhinitis due to pollen: Secondary | ICD-10-CM | POA: Diagnosis not present

## 2018-07-13 NOTE — Progress Notes (Signed)
Noted  

## 2018-07-16 DIAGNOSIS — J301 Allergic rhinitis due to pollen: Secondary | ICD-10-CM | POA: Diagnosis not present

## 2018-07-16 DIAGNOSIS — J3089 Other allergic rhinitis: Secondary | ICD-10-CM | POA: Diagnosis not present

## 2018-07-22 DIAGNOSIS — J301 Allergic rhinitis due to pollen: Secondary | ICD-10-CM | POA: Diagnosis not present

## 2018-07-22 DIAGNOSIS — J3089 Other allergic rhinitis: Secondary | ICD-10-CM | POA: Diagnosis not present

## 2018-07-30 DIAGNOSIS — J301 Allergic rhinitis due to pollen: Secondary | ICD-10-CM | POA: Diagnosis not present

## 2018-07-30 DIAGNOSIS — J3089 Other allergic rhinitis: Secondary | ICD-10-CM | POA: Diagnosis not present

## 2018-08-05 DIAGNOSIS — J3089 Other allergic rhinitis: Secondary | ICD-10-CM | POA: Diagnosis not present

## 2018-08-05 DIAGNOSIS — J301 Allergic rhinitis due to pollen: Secondary | ICD-10-CM | POA: Diagnosis not present

## 2018-08-13 DIAGNOSIS — J3081 Allergic rhinitis due to animal (cat) (dog) hair and dander: Secondary | ICD-10-CM | POA: Diagnosis not present

## 2018-08-13 DIAGNOSIS — J301 Allergic rhinitis due to pollen: Secondary | ICD-10-CM | POA: Diagnosis not present

## 2018-08-13 DIAGNOSIS — J3089 Other allergic rhinitis: Secondary | ICD-10-CM | POA: Diagnosis not present

## 2018-08-25 ENCOUNTER — Encounter: Payer: Self-pay | Admitting: Nurse Practitioner

## 2018-08-25 ENCOUNTER — Other Ambulatory Visit: Payer: Self-pay

## 2018-08-25 ENCOUNTER — Ambulatory Visit (INDEPENDENT_AMBULATORY_CARE_PROVIDER_SITE_OTHER): Payer: PPO | Admitting: Nurse Practitioner

## 2018-08-25 DIAGNOSIS — J4541 Moderate persistent asthma with (acute) exacerbation: Secondary | ICD-10-CM

## 2018-08-25 DIAGNOSIS — Z7189 Other specified counseling: Secondary | ICD-10-CM

## 2018-08-25 MED ORDER — PREDNISONE 20 MG PO TABS: ORAL_TABLET | ORAL | 0 refills | Status: DC

## 2018-08-25 NOTE — Progress Notes (Signed)
This visit type was conducted due to national recommendations for restrictions regarding the COVID-19 Pandemic (e.g. social distancing).  This format is felt to be most appropriate for this patient at this time.  All issues noted in this document were discussed and addressed.  No physical exam was performed (except for noted visual exam findings with Video Visits).  Please refer to the patient's chart (MyChart message for video visits and phone note for telephone visits) for the patient's consent to telehealth for [place].   Subjective:     Patient ID: Linda Moreno , female    DOB: 1945/10/30 , 73 y.o.   MRN: [id number]  Virtual Visit via Telephone Note  I connected with Aneliese Ghazarian @ on 08/25/18 at 10:30 AM EDT by telephone and verified that I am speaking with the person using two identifiers.   I discussed the limitations, risks, security and privacy concerns of performing an evaluation and management service by telephone and the availability of in person appointments. I also discussed with the patient that there may be a patient responsible charge related to this service. The patient expressed understanding and agreed to proceed.  Chief Complaint  Patient presents with  . Shortness of Breath    History of Present Illness:   She is having a reoccurrence. She is using the proair 3 - 4 times a day starting on yesterday.  On a regular time she uses two times per week.   Denies fever.  She has not been exposed to COVID-19 that she is aware.    Shortness of Breath  This is a new problem. The current episode started in the past 7 days. The problem occurs constantly. Pertinent negatives include no chest pain, fever, headaches, leg swelling or wheezing. The symptoms are aggravated by weather changes. The patient has no known risk factors for DVT/PE. She has tried ipratropium inhalers and OTC cough suppressants for the symptoms. The treatment provided mild relief. Her past medical history is  significant for asthma and pneumonia (while in high school). There is no history of allergies.     Past Medical History:  Diagnosis Date  . Asthma      Family History  Problem Relation Age of Onset  . Diabetes Father      Current Outpatient Medications:  .  albuterol (PROVENTIL HFA;VENTOLIN HFA) 108 (90 Base) MCG/ACT inhaler, Inhale 2 puffs into the lungs every 4 (four) hours as needed for wheezing or shortness of breath., Disp: , Rfl:  .  calcium citrate (CALCITRATE - DOSED IN MG ELEMENTAL CALCIUM) 950 MG tablet, Take 200 mg of elemental calcium by mouth daily., Disp: , Rfl:  .  cetirizine (ZYRTEC ALLERGY) 10 MG tablet, Take 1 tablet (10 mg total) by mouth daily., Disp: 1 tablet, Rfl: 0 .  fluticasone furoate-vilanterol (BREO ELLIPTA) 100-25 MCG/INH AEPB, Inhale 1 puff into the lungs daily., Disp: , Rfl:  .  ibandronate (BONIVA) 150 MG tablet, TAKE 1 TABLET ORALLY ONCE MONTHLY WITH 8-10 OZ WATER. SIT UPRIGHT AND NO FOOD/DRINK FOR 1 HOUR., Disp: , Rfl: 11 .  montelukast (SINGULAIR) 10 MG tablet, Take 1 tablet (10 mg total) by mouth daily., Disp: 1 tablet, Rfl: 0 .  multivitamin-iron-minerals-folic acid (CENTRUM) chewable tablet, Chew 1 tablet by mouth daily., Disp: , Rfl:  .  predniSONE (DELTASONE) 20 MG tablet, Take 2 tablets by mouth once daily for 3 days., Disp: 6 tablet, Rfl: 0   No Known Allergies   Review of Systems  Constitutional: Negative for  fever.  Respiratory: Positive for shortness of breath. Negative for wheezing.   Cardiovascular: Negative for chest pain, palpitations and leg swelling.  Neurological: Negative for dizziness and headaches.  All other systems reviewed and are negative.    Today's Vitals   08/25/18 1032  Temp: 98.7 F (37.1 C)  TempSrc: Oral    Observations/Objective: Unable to visualize however she is having mild difficulty with speaking in complete sentences before I hear wheezes.         Assessment and Plan: 1. Moderate persistent asthma  with acute exacerbation  Will treat with prednisone however I have advised her if she worsens or has fever she needs to go to ER.   - predniSONE (DELTASONE) 20 MG tablet; Take 2 tablets by mouth once daily for 3 days.  Dispense: 6 tablet; Refill: 0   Follow Up Instructions: Return call to office if symptoms worsen   I discussed the assessment and treatment plan with the patient. The patient was provided an opportunity to ask questions and all were answered. The patient agreed with the plan and demonstrated an understanding of the instructions.   The patient was advised to call back or seek an in-person evaluation if the symptoms worsen or if the condition fails to improve as anticipated.  COVID-19 Education: The signs and symptoms of COVID-19 were discussed with the patient and how to seek care for testing (follow up with PCP or arrange E-visit).  The importance of social distancing was discussed today.   Patient Risk:   After full review of this patients clinical status, I feel that they are at least moderate risk at this time.   I provided 11 minutes of non-face-to-face time during this encounter.   [name], FNP

## 2018-09-20 DIAGNOSIS — J301 Allergic rhinitis due to pollen: Secondary | ICD-10-CM | POA: Diagnosis not present

## 2018-09-20 DIAGNOSIS — J3089 Other allergic rhinitis: Secondary | ICD-10-CM | POA: Diagnosis not present

## 2018-09-29 ENCOUNTER — Other Ambulatory Visit: Payer: Self-pay

## 2018-09-29 ENCOUNTER — Other Ambulatory Visit: Payer: Self-pay | Admitting: Nurse Practitioner

## 2018-09-29 ENCOUNTER — Encounter: Payer: Self-pay | Admitting: Nurse Practitioner

## 2018-09-29 ENCOUNTER — Ambulatory Visit (INDEPENDENT_AMBULATORY_CARE_PROVIDER_SITE_OTHER): Payer: PPO | Admitting: Nurse Practitioner

## 2018-09-29 VITALS — BP 142/82 | HR 70 | Temp 98.5°F | Ht <= 58 in | Wt 150.8 lb

## 2018-09-29 DIAGNOSIS — R030 Elevated blood-pressure reading, without diagnosis of hypertension: Secondary | ICD-10-CM

## 2018-09-29 DIAGNOSIS — J301 Allergic rhinitis due to pollen: Secondary | ICD-10-CM | POA: Diagnosis not present

## 2018-09-29 DIAGNOSIS — E7800 Pure hypercholesterolemia, unspecified: Secondary | ICD-10-CM | POA: Insufficient documentation

## 2018-09-29 DIAGNOSIS — J3089 Other allergic rhinitis: Secondary | ICD-10-CM | POA: Diagnosis not present

## 2018-09-29 NOTE — Progress Notes (Signed)
Subjective:     Patient ID: Linda Moreno , female    DOB: 06-04-1945 , 73 y.o.   MRN: [id number]   Chief Complaint  Patient presents with  . Hyperlipidemia    HPI  Elevated cholesterol - last visit cholesterol levels were elevated.  Overall she is doing good. No current medications  Her cough has improved.      Past Medical History:  Diagnosis Date  . Asthma      Family History  Problem Relation Age of Onset  . Diabetes Father      Current Outpatient Medications:  .  albuterol (PROVENTIL HFA;VENTOLIN HFA) 108 (90 Base) MCG/ACT inhaler, Inhale 2 puffs into the lungs every 4 (four) hours as needed for wheezing or shortness of breath., Disp: , Rfl:  .  calcium citrate (CALCITRATE - DOSED IN MG ELEMENTAL CALCIUM) 950 MG tablet, Take 200 mg of elemental calcium by mouth daily., Disp: , Rfl:  .  cetirizine (ZYRTEC ALLERGY) 10 MG tablet, Take 1 tablet (10 mg total) by mouth daily., Disp: 1 tablet, Rfl: 0 .  fluticasone furoate-vilanterol (BREO ELLIPTA) 100-25 MCG/INH AEPB, Inhale 1 puff into the lungs daily., Disp: , Rfl:  .  ibandronate (BONIVA) 150 MG tablet, TAKE 1 TABLET ORALLY ONCE MONTHLY WITH 8-10 OZ WATER. SIT UPRIGHT AND NO FOOD/DRINK FOR 1 HOUR., Disp: , Rfl: 11 .  montelukast (SINGULAIR) 10 MG tablet, Take 1 tablet (10 mg total) by mouth daily., Disp: 1 tablet, Rfl: 0 .  multivitamin-iron-minerals-folic acid (CENTRUM) chewable tablet, Chew 1 tablet by mouth daily., Disp: , Rfl:  .  predniSONE (DELTASONE) 20 MG tablet, Take 2 tablets by mouth once daily for 3 days., Disp: 6 tablet, Rfl: 0   No Known Allergies   Review of Systems  Constitutional: Negative.  Negative for fever.  Respiratory: Negative.   Cardiovascular: Negative.   Neurological: Negative for dizziness and headaches.     Today's Vitals   09/29/18 0924  BP: (!) 142/82  Pulse: 70  Temp: 98.5 F (36.9 C)  TempSrc: Oral  Weight: 150 lb 12.8 oz (68.4 kg)  Height: 4' 9.2" (1.453 m)  PainSc: 0-No pain    Body mass index is 32.41 kg/m.   Objective:  Physical Exam Vitals signs reviewed.  Constitutional:      Appearance: Normal appearance.  Cardiovascular:     Rate and Rhythm: Normal rate and regular rhythm.     Pulses: Normal pulses.     Heart sounds: Normal heart sounds. No murmur.  Pulmonary:     Effort: Pulmonary effort is normal.     Breath sounds: Normal breath sounds.  Skin:    General: Skin is warm and dry.     Capillary Refill: Capillary refill takes less than 2 seconds.  Neurological:     General: No focal deficit present.     Mental Status: She is alert and oriented to person, place, and time.  Psychiatric:        Mood and Affect: Mood normal.        Behavior: Behavior normal.        Thought Content: Thought content normal.        Judgment: Judgment normal.         Assessment And Plan:     1. Elevated cholesterol  No current medications  Slightly elevated at last visit will recheck - Lipid Profile  2. Elevated blood pressure reading  Elevated above 140/80 she is advised to limit her intake of high salt  foods   Increase physical activity  At next visit if continues to be elevated will need to consider starting a medication      [name], FNP    THE PATIENT IS ENCOURAGED TO PRACTICE SOCIAL DISTANCING DUE TO THE COVID-19 PANDEMIC.

## 2018-09-30 LAB — LIPID PANEL
Chol/HDL Ratio: 2.8 ratio (ref 0.0–4.4)
Cholesterol, Total: 234 mg/dL — ABNORMAL HIGH (ref 100–199)
HDL: 85 mg/dL (ref >39)
LDL Calculated: 138 mg/dL — ABNORMAL HIGH (ref 0–99)
Triglycerides: 54 mg/dL (ref 0–149)
VLDL Cholesterol Cal: 11 mg/dL (ref 5–40)

## 2018-10-07 DIAGNOSIS — J3089 Other allergic rhinitis: Secondary | ICD-10-CM | POA: Diagnosis not present

## 2018-10-07 DIAGNOSIS — J301 Allergic rhinitis due to pollen: Secondary | ICD-10-CM | POA: Diagnosis not present

## 2018-10-15 DIAGNOSIS — J301 Allergic rhinitis due to pollen: Secondary | ICD-10-CM | POA: Diagnosis not present

## 2018-10-15 DIAGNOSIS — J3089 Other allergic rhinitis: Secondary | ICD-10-CM | POA: Diagnosis not present

## 2018-10-18 ENCOUNTER — Encounter: Payer: Self-pay | Admitting: Nurse Practitioner

## 2018-10-18 DIAGNOSIS — R030 Elevated blood-pressure reading, without diagnosis of hypertension: Secondary | ICD-10-CM | POA: Insufficient documentation

## 2018-10-19 DIAGNOSIS — J3089 Other allergic rhinitis: Secondary | ICD-10-CM | POA: Diagnosis not present

## 2018-10-19 DIAGNOSIS — J3081 Allergic rhinitis due to animal (cat) (dog) hair and dander: Secondary | ICD-10-CM | POA: Diagnosis not present

## 2018-10-19 DIAGNOSIS — J301 Allergic rhinitis due to pollen: Secondary | ICD-10-CM | POA: Diagnosis not present

## 2018-10-26 DIAGNOSIS — J301 Allergic rhinitis due to pollen: Secondary | ICD-10-CM | POA: Diagnosis not present

## 2018-10-26 DIAGNOSIS — J3089 Other allergic rhinitis: Secondary | ICD-10-CM | POA: Diagnosis not present

## 2018-11-05 DIAGNOSIS — J301 Allergic rhinitis due to pollen: Secondary | ICD-10-CM | POA: Diagnosis not present

## 2018-11-05 DIAGNOSIS — J3089 Other allergic rhinitis: Secondary | ICD-10-CM | POA: Diagnosis not present

## 2018-11-09 DIAGNOSIS — J3089 Other allergic rhinitis: Secondary | ICD-10-CM | POA: Diagnosis not present

## 2018-11-09 DIAGNOSIS — Z9109 Other allergy status, other than to drugs and biological substances: Secondary | ICD-10-CM | POA: Diagnosis present

## 2018-11-09 DIAGNOSIS — Z124 Encounter for screening for malignant neoplasm of cervix: Secondary | ICD-10-CM | POA: Diagnosis not present

## 2018-11-09 DIAGNOSIS — M810 Age-related osteoporosis without current pathological fracture: Secondary | ICD-10-CM | POA: Diagnosis not present

## 2018-11-09 DIAGNOSIS — J301 Allergic rhinitis due to pollen: Secondary | ICD-10-CM | POA: Diagnosis not present

## 2018-11-09 DIAGNOSIS — Z6831 Body mass index (BMI) 31.0-31.9, adult: Secondary | ICD-10-CM | POA: Diagnosis not present

## 2018-11-09 LAB — HM PAP SMEAR: HM Pap smear: NORMAL

## 2018-11-18 DIAGNOSIS — J301 Allergic rhinitis due to pollen: Secondary | ICD-10-CM | POA: Diagnosis not present

## 2018-11-18 DIAGNOSIS — J3089 Other allergic rhinitis: Secondary | ICD-10-CM | POA: Diagnosis not present

## 2018-11-24 DIAGNOSIS — J301 Allergic rhinitis due to pollen: Secondary | ICD-10-CM | POA: Diagnosis not present

## 2018-11-24 DIAGNOSIS — J3089 Other allergic rhinitis: Secondary | ICD-10-CM | POA: Diagnosis not present

## 2018-12-01 DIAGNOSIS — J3089 Other allergic rhinitis: Secondary | ICD-10-CM | POA: Diagnosis not present

## 2018-12-01 DIAGNOSIS — J301 Allergic rhinitis due to pollen: Secondary | ICD-10-CM | POA: Diagnosis not present

## 2018-12-09 DIAGNOSIS — J3089 Other allergic rhinitis: Secondary | ICD-10-CM | POA: Diagnosis not present

## 2018-12-09 DIAGNOSIS — J301 Allergic rhinitis due to pollen: Secondary | ICD-10-CM | POA: Diagnosis not present

## 2018-12-14 DIAGNOSIS — J4530 Mild persistent asthma, uncomplicated: Secondary | ICD-10-CM | POA: Diagnosis not present

## 2018-12-14 DIAGNOSIS — Z91013 Allergy to seafood: Secondary | ICD-10-CM | POA: Diagnosis not present

## 2018-12-14 DIAGNOSIS — J3089 Other allergic rhinitis: Secondary | ICD-10-CM | POA: Diagnosis not present

## 2018-12-14 DIAGNOSIS — J301 Allergic rhinitis due to pollen: Secondary | ICD-10-CM | POA: Diagnosis not present

## 2018-12-22 DIAGNOSIS — J301 Allergic rhinitis due to pollen: Secondary | ICD-10-CM | POA: Diagnosis not present

## 2018-12-22 DIAGNOSIS — J3089 Other allergic rhinitis: Secondary | ICD-10-CM | POA: Diagnosis not present

## 2018-12-30 DIAGNOSIS — J3089 Other allergic rhinitis: Secondary | ICD-10-CM | POA: Diagnosis not present

## 2018-12-30 DIAGNOSIS — J301 Allergic rhinitis due to pollen: Secondary | ICD-10-CM | POA: Diagnosis not present

## 2019-01-10 ENCOUNTER — Other Ambulatory Visit: Payer: Self-pay

## 2019-01-10 ENCOUNTER — Encounter: Payer: Self-pay | Admitting: Nurse Practitioner

## 2019-01-10 ENCOUNTER — Ambulatory Visit (INDEPENDENT_AMBULATORY_CARE_PROVIDER_SITE_OTHER): Payer: PPO | Admitting: Nurse Practitioner

## 2019-01-10 VITALS — BP 142/80 | HR 94 | Temp 98.8°F | Ht <= 58 in | Wt 154.8 lb

## 2019-01-10 DIAGNOSIS — E7800 Pure hypercholesterolemia, unspecified: Secondary | ICD-10-CM | POA: Diagnosis not present

## 2019-01-10 DIAGNOSIS — R030 Elevated blood-pressure reading, without diagnosis of hypertension: Secondary | ICD-10-CM

## 2019-01-10 DIAGNOSIS — Z1159 Encounter for screening for other viral diseases: Secondary | ICD-10-CM | POA: Diagnosis not present

## 2019-01-10 NOTE — Progress Notes (Signed)
Subjective:     Patient ID: Janalyn Rouse , female    DOB: Oct 19, 1945 , 73 y.o.   MRN: [id number]   Chief Complaint  Patient presents with  . Hyperlipidemia    HPI  Elevated cholesterol - last visit cholesterol levels were elevated.  Overall she is doing good. No current medications  She is caring for a 74 year old and 74 year old currently.  Her mother has been sick so she has been more worried about her.    Elevated blood pressure - at home blood pressure has been 120's/80's.  She has received her flu shot on Saturday  Hyperlipidemia This is a chronic problem. The current episode started more than 1 year ago. The problem is uncontrolled. Recent lipid tests were reviewed and are low. Exacerbating diseases include obesity. She has no history of chronic renal disease or diabetes. Pertinent negatives include no chest pain. Risk factors for coronary artery disease include a sedentary lifestyle, obesity and dyslipidemia.     Past Medical History:  Diagnosis Date  . Asthma      Family History  Problem Relation Age of Onset  . Diabetes Father      Current Outpatient Medications:  .  albuterol (PROVENTIL HFA;VENTOLIN HFA) 108 (90 Base) MCG/ACT inhaler, Inhale 2 puffs into the lungs every 4 (four) hours as needed for wheezing or shortness of breath., Disp: , Rfl:  .  calcium citrate (CALCITRATE - DOSED IN MG ELEMENTAL CALCIUM) 950 MG tablet, Take 200 mg of elemental calcium by mouth daily., Disp: , Rfl:  .  cetirizine (ZYRTEC ALLERGY) 10 MG tablet, Take 1 tablet (10 mg total) by mouth daily., Disp: 1 tablet, Rfl: 0 .  fluticasone furoate-vilanterol (BREO ELLIPTA) 100-25 MCG/INH AEPB, Inhale 1 puff into the lungs daily., Disp: , Rfl:  .  ibandronate (BONIVA) 150 MG tablet, TAKE 1 TABLET ORALLY ONCE MONTHLY WITH 8-10 OZ WATER. SIT UPRIGHT AND NO FOOD/DRINK FOR 1 HOUR., Disp: , Rfl: 11 .  montelukast (SINGULAIR) 10 MG tablet, Take 1 tablet (10 mg total) by mouth daily., Disp: 1 tablet, Rfl:  0 .  multivitamin-iron-minerals-folic acid (CENTRUM) chewable tablet, Chew 1 tablet by mouth daily., Disp: , Rfl:    No Known Allergies   Review of Systems  Constitutional: Negative.  Negative for fever.  Respiratory: Negative.   Cardiovascular: Negative.  Negative for chest pain, palpitations and leg swelling.  Endocrine: Negative for polydipsia, polyphagia and polyuria.  Neurological: Negative for dizziness and headaches.  Psychiatric/Behavioral: Negative.      Today's Vitals   01/10/19 1410  BP: (!) 142/80  Pulse: 94  Temp: 98.8 F (37.1 C)  TempSrc: Oral  Weight: 154 lb 12.8 oz (70.2 kg)  Height: 4' 9.2" (1.453 m)  PainSc: 0-No pain   Body mass index is 33.26 kg/m.   Objective:  Physical Exam Vitals signs reviewed.  Constitutional:      Appearance: Normal appearance.  Cardiovascular:     Rate and Rhythm: Normal rate and regular rhythm.     Pulses: Normal pulses.     Heart sounds: Normal heart sounds. No murmur.  Pulmonary:     Effort: Pulmonary effort is normal.     Breath sounds: Normal breath sounds.  Skin:    General: Skin is warm and dry.     Capillary Refill: Capillary refill takes less than 2 seconds.  Neurological:     General: No focal deficit present.     Mental Status: She is alert and oriented  to person, place, and time.  Psychiatric:        Mood and Affect: Mood normal.        Behavior: Behavior normal.        Thought Content: Thought content normal.        Judgment: Judgment normal.         Assessment And Plan:     1. Elevated cholesterol  Will recheck lipid panel  If remains elevated will start on low dose statin  Discussed risk factors to include heart disease with elevated cholesterol levels.  - Lipid panel  2. Encounter for hepatitis C screening test for low risk patient  Will check for Hepatitis C screening due to being born between the years 01-1965 - Hepatitis C antibody  3. Elevated blood pressure reading  She has  been under increased stress with the illness of her mother  I have advised her to monitor her blood pressure at home and limit the intake of "sauces" which contain high amounts of salt  Encouraged to be more physically active.  Return in 4 weeks for blood pressure check      [name], FNP    THE PATIENT IS ENCOURAGED TO PRACTICE SOCIAL DISTANCING DUE TO THE COVID-19 PANDEMIC.

## 2019-01-10 NOTE — Patient Instructions (Signed)

## 2019-01-11 DIAGNOSIS — J301 Allergic rhinitis due to pollen: Secondary | ICD-10-CM | POA: Diagnosis not present

## 2019-01-11 DIAGNOSIS — J3089 Other allergic rhinitis: Secondary | ICD-10-CM | POA: Diagnosis not present

## 2019-01-11 LAB — LIPID PANEL
Chol/HDL Ratio: 2.7 ratio (ref 0.0–4.4)
Cholesterol, Total: 218 mg/dL — ABNORMAL HIGH (ref 100–199)
HDL: 82 mg/dL (ref >39)
LDL Calculated: 121 mg/dL — ABNORMAL HIGH (ref 0–99)
Triglycerides: 76 mg/dL (ref 0–149)
VLDL Cholesterol Cal: 15 mg/dL (ref 5–40)

## 2019-01-11 LAB — HEPATITIS C ANTIBODY: Hep C Virus Ab: <0.1 s/co ratio (ref 0.0–0.9)

## 2019-01-20 DIAGNOSIS — J3081 Allergic rhinitis due to animal (cat) (dog) hair and dander: Secondary | ICD-10-CM | POA: Diagnosis not present

## 2019-01-20 DIAGNOSIS — J301 Allergic rhinitis due to pollen: Secondary | ICD-10-CM | POA: Diagnosis not present

## 2019-01-20 DIAGNOSIS — J3089 Other allergic rhinitis: Secondary | ICD-10-CM | POA: Diagnosis not present

## 2019-01-26 DIAGNOSIS — J3089 Other allergic rhinitis: Secondary | ICD-10-CM | POA: Diagnosis not present

## 2019-01-26 DIAGNOSIS — J301 Allergic rhinitis due to pollen: Secondary | ICD-10-CM | POA: Diagnosis not present

## 2019-02-07 ENCOUNTER — Other Ambulatory Visit: Payer: Self-pay

## 2019-02-07 ENCOUNTER — Encounter: Payer: Self-pay | Admitting: Nurse Practitioner

## 2019-02-07 ENCOUNTER — Ambulatory Visit (INDEPENDENT_AMBULATORY_CARE_PROVIDER_SITE_OTHER): Payer: PPO | Admitting: Nurse Practitioner

## 2019-02-07 VITALS — BP 180/108 | HR 94 | Temp 98.4°F | Ht <= 58 in | Wt 156.4 lb

## 2019-02-07 DIAGNOSIS — I10 Essential (primary) hypertension: Secondary | ICD-10-CM | POA: Diagnosis not present

## 2019-02-07 LAB — POCT UA - MICROALBUMIN
Albumin/Creatinine Ratio, Urine, POC: 30
Creatinine, POC: 200 mg/dL
Microalbumin Ur, POC: 10 mg/L

## 2019-02-07 MED ORDER — AMLODIPINE BESYLATE 5 MG PO TABS: 5.0000 mg | ORAL_TABLET | Freq: Every day | ORAL | 2 refills | Status: DC

## 2019-02-07 NOTE — Patient Instructions (Signed)

## 2019-02-07 NOTE — Progress Notes (Addendum)
Subjective:     Patient ID: Linda Moreno , female    DOB: 02-19-1946 , 73 y.o.   MRN: [id number]   Chief Complaint  Patient presents with  . Hypertension    HPI  She is not taking any ibuprofen/NSAIDs, she has decrea sed her intake of high salt foods.  Denies family history of hypertension. She reports she is sleeping well.  Denies headache or dizziness.  She is only taking Delsym as needed.  Denies problems urinating.    Wt Readings from Last 3 Encounters: 02/07/19 : 156 lb 6.4 oz (70.9 kg) 01/10/19 : 154 lb 12.8 oz (70.2 kg) 09/29/18 : 150 lb 12.8 oz (68.4 kg)   Hypertension The current episode started more than 1 month ago. The problem is uncontrolled. Pertinent negatives include no anxiety, blurred vision, chest pain, headaches or palpitations. Agents associated with hypertension include NSAIDs. Risk factors for coronary artery disease include obesity and sedentary lifestyle. Past treatments include nothing. There is no history of angina or kidney disease. There is no history of chronic renal disease.     Past Medical History:  Diagnosis Date  . Asthma      Family History  Problem Relation Age of Onset  . Diabetes Father      Current Outpatient Medications:  .  albuterol (PROVENTIL HFA;VENTOLIN HFA) 108 (90 Base) MCG/ACT inhaler, Inhale 2 puffs into the lungs every 4 (four) hours as needed for wheezing or shortness of breath., Disp: , Rfl:  .  calcium citrate (CALCITRATE - DOSED IN MG ELEMENTAL CALCIUM) 950 MG tablet, Take 200 mg of elemental calcium by mouth daily., Disp: , Rfl:  .  cetirizine (ZYRTEC ALLERGY) 10 MG tablet, Take 1 tablet (10 mg total) by mouth daily., Disp: 1 tablet, Rfl: 0 .  fluticasone furoate-vilanterol (BREO ELLIPTA) 100-25 MCG/INH AEPB, Inhale 1 puff into the lungs daily., Disp: , Rfl:  .  ibandronate (BONIVA) 150 MG tablet, TAKE 1 TABLET ORALLY ONCE MONTHLY WITH 8-10 OZ WATER. SIT UPRIGHT AND NO FOOD/DRINK FOR 1 HOUR., Disp: , Rfl: 11 .   montelukast (SINGULAIR) 10 MG tablet, Take 1 tablet (10 mg total) by mouth daily., Disp: 1 tablet, Rfl: 0 .  multivitamin-iron-minerals-folic acid (CENTRUM) chewable tablet, Chew 1 tablet by mouth daily., Disp: , Rfl:    No Known Allergies   Review of Systems  Constitutional: Negative for fatigue.  Eyes: Negative for blurred vision.  Respiratory: Negative.   Cardiovascular: Negative for chest pain, palpitations and leg swelling.  Neurological: Negative for dizziness and headaches.  Psychiatric/Behavioral: Negative.      Today's Vitals   02/07/19 1421  BP: (!) 180/108  Pulse: 94  Temp: 98.4 F (36.9 C)  TempSrc: Oral  Weight: 156 lb 6.4 oz (70.9 kg)  Height: 4\' 9"  (1.448 m)  PainSc: 0-No pain   Body mass index is 33.84 kg/m.   Objective:  Physical Exam Vitals signs reviewed.  Constitutional:      Appearance: Normal appearance.  Cardiovascular:     Rate and Rhythm: Normal rate and regular rhythm.     Pulses: Normal pulses.     Heart sounds: Normal heart sounds. No murmur.  Pulmonary:     Effort: Pulmonary effort is normal. No respiratory distress.     Breath sounds: Normal breath sounds.  Skin:    Capillary Refill: Capillary refill takes less than 2 seconds.  Neurological:     General: No focal deficit present.     Mental Status: She is alert and  oriented to person, place, and time.  Psychiatric:        Mood and Affect: Mood normal.        Behavior: Behavior normal.        Thought Content: Thought content normal.        Judgment: Judgment normal.         Assessment And Plan:     1. Essential hypertension  Continues to be elevated in spite of changing her diet  EKG done NSR with HR 75  Will start her on amlodipine 5 mg daily, discussed side effects of swollen feet which usually resolved in a few days.  She is not on any medications to cause her blood pressure to increase - amLODipine (NORVASC) 5 MG tablet; Take 1 tablet (5 mg total) by mouth daily.   Dispense: 30 tablet; Refill: 2   [name], FNP    THE PATIENT IS ENCOURAGED TO PRACTICE SOCIAL DISTANCING DUE TO THE COVID-19 PANDEMIC.

## 2019-02-11 DIAGNOSIS — J301 Allergic rhinitis due to pollen: Secondary | ICD-10-CM | POA: Diagnosis not present

## 2019-02-11 DIAGNOSIS — J3089 Other allergic rhinitis: Secondary | ICD-10-CM | POA: Diagnosis not present

## 2019-02-15 ENCOUNTER — Ambulatory Visit: Payer: PPO

## 2019-02-15 ENCOUNTER — Other Ambulatory Visit: Payer: Self-pay

## 2019-02-15 VITALS — BP 170/92 | HR 78 | Temp 98.5°F

## 2019-02-15 DIAGNOSIS — R030 Elevated blood-pressure reading, without diagnosis of hypertension: Secondary | ICD-10-CM

## 2019-02-15 NOTE — Progress Notes (Signed)
Pt is here today for a b/p check  170/92 Janece aware.

## 2019-02-18 DIAGNOSIS — J3089 Other allergic rhinitis: Secondary | ICD-10-CM | POA: Diagnosis not present

## 2019-02-18 DIAGNOSIS — J301 Allergic rhinitis due to pollen: Secondary | ICD-10-CM | POA: Diagnosis not present

## 2019-02-22 ENCOUNTER — Other Ambulatory Visit: Payer: Self-pay | Admitting: Nurse Practitioner

## 2019-02-22 ENCOUNTER — Ambulatory Visit: Payer: PPO

## 2019-02-22 ENCOUNTER — Other Ambulatory Visit: Payer: Self-pay

## 2019-02-22 VITALS — BP 160/92 | HR 78 | Temp 98.8°F | Ht <= 58 in | Wt 157.0 lb

## 2019-02-22 DIAGNOSIS — R030 Elevated blood-pressure reading, without diagnosis of hypertension: Secondary | ICD-10-CM

## 2019-02-22 DIAGNOSIS — I10 Essential (primary) hypertension: Secondary | ICD-10-CM

## 2019-02-22 MED ORDER — AMLODIPINE BESYLATE 10 MG PO TABS: 10.0000 mg | ORAL_TABLET | Freq: Every day | ORAL | 2 refills | Status: DC

## 2019-02-22 NOTE — Progress Notes (Signed)
Pt presents here today for a b/p check 160/92 Linda Moreno aware

## 2019-02-23 DIAGNOSIS — J301 Allergic rhinitis due to pollen: Secondary | ICD-10-CM | POA: Diagnosis not present

## 2019-02-23 DIAGNOSIS — J3089 Other allergic rhinitis: Secondary | ICD-10-CM | POA: Diagnosis not present

## 2019-03-01 ENCOUNTER — Other Ambulatory Visit: Payer: Self-pay | Admitting: Nurse Practitioner

## 2019-03-01 DIAGNOSIS — I10 Essential (primary) hypertension: Secondary | ICD-10-CM

## 2019-03-04 DIAGNOSIS — J3089 Other allergic rhinitis: Secondary | ICD-10-CM | POA: Diagnosis not present

## 2019-03-04 DIAGNOSIS — J301 Allergic rhinitis due to pollen: Secondary | ICD-10-CM | POA: Diagnosis not present

## 2019-03-08 ENCOUNTER — Other Ambulatory Visit: Payer: Self-pay

## 2019-03-08 ENCOUNTER — Ambulatory Visit (INDEPENDENT_AMBULATORY_CARE_PROVIDER_SITE_OTHER): Payer: PPO | Admitting: Nurse Practitioner

## 2019-03-08 ENCOUNTER — Encounter: Payer: Self-pay | Admitting: Nurse Practitioner

## 2019-03-08 VITALS — BP 140/78 | HR 88 | Temp 98.8°F | Ht <= 58 in | Wt 157.0 lb

## 2019-03-08 DIAGNOSIS — E2839 Other primary ovarian failure: Secondary | ICD-10-CM

## 2019-03-08 DIAGNOSIS — I10 Essential (primary) hypertension: Secondary | ICD-10-CM | POA: Diagnosis not present

## 2019-03-08 NOTE — Progress Notes (Signed)
Subjective:     Patient ID: Linda Moreno , female    DOB: 1945-07-04 , 73 y.o.   MRN: [id number]   Chief Complaint  Patient presents with  . Hypertension    patient presents today for 2 week blood pressure check    HPI  She is here today for blood pressure check.  She has changed her diet and cut out her intake of fat back, hogjaws and hot sauce     Hypertension The current episode started more than 1 month ago. The problem has been gradually improving since onset. The problem is uncontrolled. Pertinent negatives include no anxiety, blurred vision, chest pain, headaches or palpitations. Risk factors for coronary artery disease include obesity and sedentary lifestyle. Past treatments include nothing. The current treatment provides moderate improvement. There are no compliance problems.  There is no history of angina or kidney disease. There is no history of chronic renal disease.     Past Medical History:  Diagnosis Date  . Asthma      Family History  Problem Relation Age of Onset  . Diabetes Father      Current Outpatient Medications:  .  albuterol (PROVENTIL HFA;VENTOLIN HFA) 108 (90 Base) MCG/ACT inhaler, Inhale 2 puffs into the lungs every 4 (four) hours as needed for wheezing or shortness of breath., Disp: , Rfl:  .  amLODipine (NORVASC) 10 MG tablet, Take 1 tablet (10 mg total) by mouth daily., Disp: 30 tablet, Rfl: 2 .  calcium citrate (CALCITRATE - DOSED IN MG ELEMENTAL CALCIUM) 950 MG tablet, Take 200 mg of elemental calcium by mouth daily., Disp: , Rfl:  .  cetirizine (ZYRTEC ALLERGY) 10 MG tablet, Take 1 tablet (10 mg total) by mouth daily., Disp: 1 tablet, Rfl: 0 .  fluticasone furoate-vilanterol (BREO ELLIPTA) 100-25 MCG/INH AEPB, Inhale 1 puff into the lungs daily., Disp: , Rfl:  .  ibandronate (BONIVA) 150 MG tablet, TAKE 1 TABLET ORALLY ONCE MONTHLY WITH 8-10 OZ WATER. SIT UPRIGHT AND NO FOOD/DRINK FOR 1 HOUR., Disp: , Rfl: 11 .  montelukast (SINGULAIR) 10 MG  tablet, Take 1 tablet (10 mg total) by mouth daily., Disp: 1 tablet, Rfl: 0 .  multivitamin-iron-minerals-folic acid (CENTRUM) chewable tablet, Chew 1 tablet by mouth daily., Disp: , Rfl:    No Known Allergies   Review of Systems  Constitutional: Negative for fatigue.  Eyes: Negative for blurred vision.  Respiratory: Negative.   Cardiovascular: Negative for chest pain, palpitations and leg swelling.  Endocrine: Negative for polydipsia, polyphagia and polyuria.  Neurological: Negative for dizziness and headaches.  Psychiatric/Behavioral: Negative.      Today's Vitals   03/08/19 1406  BP: 140/78  Pulse: 88  Temp: 98.8 F (37.1 C)  TempSrc: Oral  Weight: 157 lb (71.2 kg)  Height: 4\' 9"  (1.448 m)  PainSc: 0-No pain   Body mass index is 33.97 kg/m.   Objective:  Physical Exam Vitals signs reviewed.  Constitutional:      General: She is not in acute distress.    Appearance: Normal appearance. She is obese.  Cardiovascular:     Rate and Rhythm: Normal rate and regular rhythm.     Pulses: Normal pulses.     Heart sounds: Normal heart sounds. No murmur.  Pulmonary:     Effort: Pulmonary effort is normal. No respiratory distress.     Breath sounds: Normal breath sounds.  Skin:    Capillary Refill: Capillary refill takes less than 2 seconds.  Neurological:  General: No focal deficit present.     Mental Status: She is alert and oriented to person, place, and time.  Psychiatric:        Mood and Affect: Mood normal.        Behavior: Behavior normal.        Thought Content: Thought content normal.        Judgment: Judgment normal.         Assessment And Plan:     1. Essential hypertension  Chronic, her blood pressure is much improved since her last visit and increasing her amlodipine 10 mg daily.    Congratulated her on improvement of her blood pressure.    [name], FNP    THE PATIENT IS ENCOURAGED TO PRACTICE SOCIAL DISTANCING DUE TO THE COVID-19  PANDEMIC.

## 2019-03-15 DIAGNOSIS — J301 Allergic rhinitis due to pollen: Secondary | ICD-10-CM | POA: Diagnosis not present

## 2019-03-15 DIAGNOSIS — J3089 Other allergic rhinitis: Secondary | ICD-10-CM | POA: Diagnosis not present

## 2019-03-19 ENCOUNTER — Other Ambulatory Visit: Payer: Self-pay | Admitting: Nurse Practitioner

## 2019-03-19 DIAGNOSIS — I10 Essential (primary) hypertension: Secondary | ICD-10-CM

## 2019-03-22 DIAGNOSIS — J3089 Other allergic rhinitis: Secondary | ICD-10-CM | POA: Diagnosis not present

## 2019-03-22 DIAGNOSIS — J301 Allergic rhinitis due to pollen: Secondary | ICD-10-CM | POA: Diagnosis not present

## 2019-03-29 DIAGNOSIS — J301 Allergic rhinitis due to pollen: Secondary | ICD-10-CM | POA: Diagnosis not present

## 2019-03-29 DIAGNOSIS — J3089 Other allergic rhinitis: Secondary | ICD-10-CM | POA: Diagnosis not present

## 2019-04-05 DIAGNOSIS — J3089 Other allergic rhinitis: Secondary | ICD-10-CM | POA: Diagnosis not present

## 2019-04-05 DIAGNOSIS — J301 Allergic rhinitis due to pollen: Secondary | ICD-10-CM | POA: Diagnosis not present

## 2019-04-06 ENCOUNTER — Encounter: Payer: Self-pay | Admitting: Nurse Practitioner

## 2019-04-06 ENCOUNTER — Ambulatory Visit: Payer: PPO | Admitting: Nurse Practitioner

## 2019-04-06 ENCOUNTER — Ambulatory Visit (INDEPENDENT_AMBULATORY_CARE_PROVIDER_SITE_OTHER): Payer: PPO

## 2019-04-06 ENCOUNTER — Ambulatory Visit (INDEPENDENT_AMBULATORY_CARE_PROVIDER_SITE_OTHER): Payer: PPO | Admitting: Nurse Practitioner

## 2019-04-06 ENCOUNTER — Other Ambulatory Visit: Payer: Self-pay

## 2019-04-06 VITALS — BP 148/76 | HR 74 | Temp 98.9°F | Ht <= 58 in | Wt 158.0 lb

## 2019-04-06 VITALS — BP 148/76 | HR 74 | Temp 98.9°F | Ht <= 58 in | Wt 158.2 lb

## 2019-04-06 DIAGNOSIS — E7800 Pure hypercholesterolemia, unspecified: Secondary | ICD-10-CM | POA: Diagnosis not present

## 2019-04-06 DIAGNOSIS — Z0000 Encounter for general adult medical examination without abnormal findings: Secondary | ICD-10-CM | POA: Diagnosis not present

## 2019-04-06 DIAGNOSIS — I10 Essential (primary) hypertension: Secondary | ICD-10-CM | POA: Diagnosis not present

## 2019-04-06 DIAGNOSIS — E559 Vitamin D deficiency, unspecified: Secondary | ICD-10-CM | POA: Diagnosis not present

## 2019-04-06 NOTE — Progress Notes (Signed)
Subjective:   Linda Moreno is a 73 y.o. female who presents for Medicare Annual (Subsequent) preventive examination.  Review of Systems:  n/a Cardiac Risk Factors include: advanced age (>68men, >66 women)     Objective:     Vitals: BP (!) 148/76 (BP Location: Left Arm, Patient Position: Sitting, Cuff Size: Normal)   Pulse 74   Temp 98.9 F (37.2 C) (Oral)   Ht 4\' 9"  (1.448 m)   Wt 158 lb 3.2 oz (71.8 kg)   SpO2 97%   BMI 34.23 kg/m   Body mass index is 34.23 kg/m.  Advanced Directives 04/06/2019 04/01/2018  Does Patient Have a Medical Advance Directive? No No  Would patient like information on creating a medical advance directive? - No - Patient declined    Tobacco Social History   Tobacco Use  Smoking Status Never Smoker  Smokeless Tobacco Never Used     Counseling given: Not Answered   Clinical Intake:  Pre-visit preparation completed: Yes  Pain : No/denies pain     Nutritional Status: BMI > 30  Obese Nutritional Risks: None Diabetes: No  What is the last grade level you completed in school?: 12th grade  Interpreter Needed?: No  Information entered by :: NAllen LPN  Past Medical History:  Diagnosis Date  . Asthma    History reviewed. No pertinent surgical history. Family History  Problem Relation Age of Onset  . Diabetes Father    Social History   Socioeconomic History  . Marital status: Married    Spouse name: Not on file  . Number of children: Not on file  . Years of education: Not on file  . Highest education level: Not on file  Occupational History  . Occupation: retired  Scientific laboratory technician  . Financial resource strain: Not hard at all  . Food insecurity    Worry: Never true    Inability: Never true  . Transportation needs    Medical: No    Non-medical: No  Tobacco Use  . Smoking status: Never Smoker  . Smokeless tobacco: Never Used  Substance and Sexual Activity  . Alcohol use: No  . Drug use: No  . Sexual activity: Not  Currently  Lifestyle  . Physical activity    Days per week: 5 days    Minutes per session: 60 min  . Stress: Not at all  Relationships  . Social Herbalist on phone: Not on file    Gets together: Not on file    Attends religious service: Not on file    Active member of club or organization: Not on file    Attends meetings of clubs or organizations: Not on file    Relationship status: Not on file  Other Topics Concern  . Not on file  Social History Narrative  . Not on file    Outpatient Encounter Medications as of 04/06/2019  Medication Sig  . albuterol (PROVENTIL HFA;VENTOLIN HFA) 108 (90 Base) MCG/ACT inhaler Inhale 2 puffs into the lungs every 4 (four) hours as needed for wheezing or shortness of breath.  Marland Kitchen amLODipine (NORVASC) 10 MG tablet TAKE 1 TABLET BY MOUTH EVERY DAY  . calcium citrate (CALCITRATE - DOSED IN MG ELEMENTAL CALCIUM) 950 MG tablet Take 200 mg of elemental calcium by mouth daily.  . cetirizine (ZYRTEC ALLERGY) 10 MG tablet Take 1 tablet (10 mg total) by mouth daily.  . fluticasone furoate-vilanterol (BREO ELLIPTA) 100-25 MCG/INH AEPB Inhale 1 puff into the lungs daily.  Marland Kitchen  ibandronate (BONIVA) 150 MG tablet TAKE 1 TABLET ORALLY ONCE MONTHLY WITH 8-10 OZ WATER. SIT UPRIGHT AND NO FOOD/DRINK FOR 1 HOUR.  . multivitamin-iron-minerals-folic acid (CENTRUM) chewable tablet Chew 1 tablet by mouth daily.  . montelukast (SINGULAIR) 10 MG tablet Take 1 tablet (10 mg total) by mouth daily.   No facility-administered encounter medications on file as of 04/06/2019.     Activities of Daily Living In your present state of health, do you have any difficulty performing the following activities: 04/06/2019  Hearing? N  Vision? N  Difficulty concentrating or making decisions? N  Walking or climbing stairs? N  Dressing or bathing? N  Doing errands, shopping? N  Preparing Food and eating ? N  Using the Toilet? N  In the past six months, have you accidently leaked  urine? N  Do you have problems with loss of bowel control? N  Managing your Medications? N  Managing your Finances? N  Housekeeping or managing your Housekeeping? N  Some recent data might be hidden    Patient Care Team: [name], FNP as PCP - General (General Practice)    Assessment:   This is a routine wellness examination for Linda Moreno.  Exercise Activities and Dietary recommendations Current Exercise Habits: Home exercise routine, Type of exercise: calisthenics, Time (Minutes): 60, Frequency (Times/Week): 5, Weekly Exercise (Minutes/Week): 300  Goals    . Weight (lb) < 200 lb (90.7 kg) (pt-stated)     Wants to lose 15 pounds    . Weight (lb) < 200 lb (90.7 kg)     04/06/2019, wants to lose 10 pounds       Fall Risk Fall Risk  04/06/2019 03/08/2019 02/07/2019 01/10/2019 09/29/2018  Falls in the past year? 0 0 0 0 0  Number falls in past yr: 0 - - - -  Risk for fall due to : Medication side effect - - - -  Follow up Falls evaluation completed;Education provided;Falls prevention discussed - - - -   Is the patient's home free of loose throw rugs in walkways, pet beds, electrical cords, etc?   yes      Grab bars in the bathroom? no      Handrails on the stairs?   yes      Adequate lighting?   yes  Timed Get Up and Go performed: n/a  Depression Screen PHQ 2/9 Scores 04/06/2019 03/08/2019 02/07/2019 01/10/2019  PHQ - 2 Score 0 0 0 0  PHQ- 9 Score - - - -     Cognitive Function     6CIT Screen 04/06/2019 04/01/2018  What Year? 0 points 0 points  What month? 0 points 0 points  What time? 0 points 0 points  Count back from 20 0 points 0 points  Months in reverse 0 points 0 points  Repeat phrase 8 points 2 points  Total Score 8 2    Immunization History  Administered Date(s) Administered  . Influenza, High Dose Seasonal PF 02/15/2018  . Influenza-Unspecified 01/07/2019  . Tdap 02/15/2018  . Zoster Recombinat (Shingrix) 08/12/2017, 12/28/2017    Qualifies for  Shingles Vaccine? yes  Screening Tests Health Maintenance  Topic Date Due  . DEXA SCAN  04/05/2020 (Originally 06/08/2009)  . PNA vac Low Risk Adult (2 of 2 - PPSV23) 04/05/2020 (Originally 03/16/2015)  . MAMMOGRAM  06/10/2020  . COLONOSCOPY  12/10/2024  . TETANUS/TDAP  02/16/2028  . INFLUENZA VACCINE  Completed  . Hepatitis C Screening  Completed    Cancer Screenings: Lung:  Low Dose CT Chest recommended if Age 6-80 years, 30 pack-year currently smoking OR have quit w/in 15years. Patient does not qualify. Breast:  Up to date on Mammogram? Yes   Up to date of Bone Density/Dexa? Yes Colorectal: up to date  Additional Screenings: : Hepatitis C Screening: 12/2018     Plan:    Patient wants to lose 10 pounds   I have personally reviewed and noted the following in the patient's chart:   . Medical and social history . Use of alcohol, tobacco or illicit drugs  . Current medications and supplements . Functional ability and status . Nutritional status . Physical activity . Advanced directives . List of other physicians . Hospitalizations, surgeries, and ER visits in previous 12 months . Vitals . Screenings to include cognitive, depression, and falls . Referrals and appointments  In addition, I have reviewed and discussed with patient certain preventive protocols, quality metrics, and best practice recommendations. A written personalized care plan for preventive services as well as general preventive health recommendations were provided to patient.     Kellie Simmering, LPN  [phone]

## 2019-04-06 NOTE — Patient Instructions (Signed)
Linda Moreno , Thank you for taking time to come for your Medicare Wellness Visit. I appreciate your ongoing commitment to your health goals. Please review the following plan we discussed and let me know if I can assist you in the future.   Screening recommendations/referrals: Colonoscopy: 11/2014 Mammogram: 05/2018 Bone Density: 2019 per patient Recommended yearly ophthalmology/optometry visit for glaucoma screening and checkup Recommended yearly dental visit for hygiene and checkup  Vaccinations: Influenza vaccine: 12/2018 Pneumococcal vaccine: 02/2014 Tdap vaccine: 01/2018 Shingles vaccine: 12/2017    Advanced directives: Advance directive discussed with you today. Even though you declined this today please call our office should you change your mind and we can give you the proper paperwork for you to fill out.   Conditions/risks identified: obesity  Next appointment: 07/07/2019 at 2:15   Preventive Care 74 Years and Older, Female Preventive care refers to lifestyle choices and visits with your health care provider that can promote health and wellness. What does preventive care include?  A yearly physical exam. This is also called an annual well check.  Dental exams once or twice a year.  Routine eye exams. Ask your health care provider how often you should have your eyes checked.  Personal lifestyle choices, including:  Daily care of your teeth and gums.  Regular physical activity.  Eating a healthy diet.  Avoiding tobacco and drug use.  Limiting alcohol use.  Practicing safe sex.  Taking low-dose aspirin every day.  Taking vitamin and mineral supplements as recommended by your health care provider. What happens during an annual well check? The services and screenings done by your health care provider during your annual well check will depend on your age, overall health, lifestyle risk factors, and family history of disease. Counseling  Your health care provider may  ask you questions about your:  Alcohol use.  Tobacco use.  Drug use.  Emotional well-being.  Home and relationship well-being.  Sexual activity.  Eating habits.  History of falls.  Memory and ability to understand (cognition).  Work and work Statistician.  Reproductive health. Screening  You may have the following tests or measurements:  Height, weight, and BMI.  Blood pressure.  Lipid and cholesterol levels. These may be checked every 5 years, or more frequently if you are over 4 years old.  Skin check.  Lung cancer screening. You may have this screening every year starting at age 14 if you have a 30-pack-year history of smoking and currently smoke or have quit within the past 15 years.  Fecal occult blood test (FOBT) of the stool. You may have this test every year starting at age 65.  Flexible sigmoidoscopy or colonoscopy. You may have a sigmoidoscopy every 5 years or a colonoscopy every 10 years starting at age 72.  Hepatitis C blood test.  Hepatitis B blood test.  Sexually transmitted disease (STD) testing.  Diabetes screening. This is done by checking your blood sugar (glucose) after you have not eaten for a while (fasting). You may have this done every 1-3 years.  Bone density scan. This is done to screen for osteoporosis. You may have this done starting at age 84.  Mammogram. This may be done every 1-2 years. Talk to your health care provider about how often you should have regular mammograms. Talk with your health care provider about your test results, treatment options, and if necessary, the need for more tests. Vaccines  Your health care provider may recommend certain vaccines, such as:  Influenza vaccine. This is  recommended every year.  Tetanus, diphtheria, and acellular pertussis (Tdap, Td) vaccine. You may need a Td booster every 10 years.  Zoster vaccine. You may need this after age 59.  Pneumococcal 13-valent conjugate (PCV13) vaccine. One  dose is recommended after age 54.  Pneumococcal polysaccharide (PPSV23) vaccine. One dose is recommended after age 31. Talk to your health care provider about which screenings and vaccines you need and how often you need them. This information is not intended to replace advice given to you by your health care provider. Make sure you discuss any questions you have with your health care provider. Document Released: 06/08/2015 Document Revised: 01/30/2016 Document Reviewed: 03/13/2015 Elsevier Interactive Patient Education  2017 [employer] Prevention in the Home Falls can cause injuries. They can happen to people of all ages. There are many things you can do to make your home safe and to help prevent falls. What can I do on the outside of my home?  Regularly fix the edges of walkways and driveways and fix any cracks.  Remove anything that might make you trip as you walk through a door, such as a raised step or threshold.  Trim any bushes or trees on the path to your home.  Use bright outdoor lighting.  Clear any walking paths of anything that might make someone trip, such as rocks or tools.  Regularly check to see if handrails are loose or broken. Make sure that both sides of any steps have handrails.  Any raised decks and porches should have guardrails on the edges.  Have any leaves, snow, or ice cleared regularly.  Use sand or salt on walking paths during winter.  Clean up any spills in your garage right away. This includes oil or grease spills. What can I do in the bathroom?  Use night lights.  Install grab bars by the toilet and in the tub and shower. Do not use towel bars as grab bars.  Use non-skid mats or decals in the tub or shower.  If you need to sit down in the shower, use a plastic, non-slip stool.  Keep the floor dry. Clean up any water that spills on the floor as soon as it happens.  Remove soap buildup in the tub or shower regularly.  Attach bath  mats securely with double-sided non-slip rug tape.  Do not have throw rugs and other things on the floor that can make you trip. What can I do in the bedroom?  Use night lights.  Make sure that you have a light by your bed that is easy to reach.  Do not use any sheets or blankets that are too big for your bed. They should not hang down onto the floor.  Have a firm chair that has side arms. You can use this for support while you get dressed.  Do not have throw rugs and other things on the floor that can make you trip. What can I do in the kitchen?  Clean up any spills right away.  Avoid walking on wet floors.  Keep items that you use a lot in easy-to-reach places.  If you need to reach something above you, use a strong step stool that has a grab bar.  Keep electrical cords out of the way.  Do not use floor polish or wax that makes floors slippery. If you must use wax, use non-skid floor wax.  Do not have throw rugs and other things on the floor that can make you trip.  What can I do with my stairs?  Do not leave any items on the stairs.  Make sure that there are handrails on both sides of the stairs and use them. Fix handrails that are broken or loose. Make sure that handrails are as long as the stairways.  Check any carpeting to make sure that it is firmly attached to the stairs. Fix any carpet that is loose or worn.  Avoid having throw rugs at the top or bottom of the stairs. If you do have throw rugs, attach them to the floor with carpet tape.  Make sure that you have a light switch at the top of the stairs and the bottom of the stairs. If you do not have them, ask someone to add them for you. What else can I do to help prevent falls?  Wear shoes that:  Do not have high heels.  Have rubber bottoms.  Are comfortable and fit you well.  Are closed at the toe. Do not wear sandals.  If you use a stepladder:  Make sure that it is fully opened. Do not climb a closed  stepladder.  Make sure that both sides of the stepladder are locked into place.  Ask someone to hold it for you, if possible.  Clearly mark and make sure that you can see:  Any grab bars or handrails.  First and last steps.  Where the edge of each step is.  Use tools that help you move around (mobility aids) if they are needed. These include:  Canes.  Walkers.  Scooters.  Crutches.  Turn on the lights when you go into a dark area. Replace any light bulbs as soon as they burn out.  Set up your furniture so you have a clear path. Avoid moving your furniture around.  If any of your floors are uneven, fix them.  If there are any pets around you, be aware of where they are.  Review your medicines with your doctor. Some medicines can make you feel dizzy. This can increase your chance of falling. Ask your doctor what other things that you can do to help prevent falls. This information is not intended to replace advice given to you by your health care provider. Make sure you discuss any questions you have with your health care provider. Document Released: 03/08/2009 Document Revised: 10/18/2015 Document Reviewed: 06/16/2014 Elsevier Interactive Patient Education  2017 [employer].

## 2019-04-06 NOTE — Progress Notes (Signed)
Subjective:     Patient ID: Linda Moreno , female    DOB: 08-08-1945 , 73 y.o.   MRN: [id number]   Chief Complaint  Patient presents with  . Annual Exam    HPI  Here for HM she also had an AWV with [facility]   The patient states she is post menopausal status for birth control  Mammogram last done 06/11/2018.  Negative for: breast discharge, breast lump(s), breast pain and breast self exam.  Pertinent negatives include abnormal bleeding (hematology), anxiety, decreased libido, depression, difficulty falling sleep, dyspareunia, history of infertility, nocturia, sexual dysfunction, sleep disturbances, urinary incontinence, urinary urgency, vaginal discharge and vaginal itching. Diet regular with low salt.  The patient states her exercise level is minimal, she will take the steps 8-9 times.      The patient's tobacco use is:  Social History   Tobacco Use  Smoking Status Never Smoker  Smokeless Tobacco Never Used   She has been exposed to passive smoke. The patient's alcohol use is:  Social History   Substance and Sexual Activity  Alcohol Use No   She had a PAP in May 2020 with Dr. Mikey Kirschner  Past Medical History:  Diagnosis Date  . Asthma      Family History  Problem Relation Age of Onset  . Diabetes Father      Current Outpatient Medications:  .  albuterol (PROVENTIL HFA;VENTOLIN HFA) 108 (90 Base) MCG/ACT inhaler, Inhale 2 puffs into the lungs every 4 (four) hours as needed for wheezing or shortness of breath., Disp: , Rfl:  .  amLODipine (NORVASC) 10 MG tablet, TAKE 1 TABLET BY MOUTH EVERY DAY, Disp: 30 tablet, Rfl: 2 .  calcium citrate (CALCITRATE - DOSED IN MG ELEMENTAL CALCIUM) 950 MG tablet, Take 200 mg of elemental calcium by mouth daily., Disp: , Rfl:  .  cetirizine (ZYRTEC ALLERGY) 10 MG tablet, Take 1 tablet (10 mg total) by mouth daily., Disp: 1 tablet, Rfl: 0 .  fluticasone furoate-vilanterol (BREO ELLIPTA) 100-25 MCG/INH AEPB, Inhale 1 puff into the lungs daily., Disp:  , Rfl:  .  ibandronate (BONIVA) 150 MG tablet, TAKE 1 TABLET ORALLY ONCE MONTHLY WITH 8-10 OZ WATER. SIT UPRIGHT AND NO FOOD/DRINK FOR 1 HOUR., Disp: , Rfl: 11 .  montelukast (SINGULAIR) 10 MG tablet, Take 1 tablet (10 mg total) by mouth daily., Disp: 1 tablet, Rfl: 0 .  multivitamin-iron-minerals-folic acid (CENTRUM) chewable tablet, Chew 1 tablet by mouth daily., Disp: , Rfl:    No Known Allergies   Review of Systems  Constitutional: Negative.   HENT: Negative.   Eyes: Negative.   Respiratory: Negative.   Cardiovascular: Negative.   Gastrointestinal: Negative.   Endocrine: Negative.   Genitourinary: Negative.   Musculoskeletal: Negative.   Skin: Negative.   Allergic/Immunologic: Negative.   Neurological: Negative.   Hematological: Negative.   Psychiatric/Behavioral: Negative.      Today's Vitals   04/06/19 1137  BP: (!) 148/76  Pulse: 74  Temp: 98.9 F (37.2 C)  TempSrc: Oral  Weight: 158 lb (71.7 kg)  Height: _0  (1.448 m)  PainSc: 0-No pain   Body mass index is 34.19 kg/m.   Objective:  Physical Exam Constitutional:      Appearance: Normal appearance. She is well-developed.  HENT:     Head: Normocephalic and atraumatic.     Right Ear: Hearing, tympanic membrane, ear canal and external ear normal. There is no impacted cerumen.     Left Ear: Hearing, tympanic membrane, ear  canal and external ear normal. There is no impacted cerumen.     Nose: Nose normal.     Mouth/Throat:     Mouth: Mucous membranes are moist.  Eyes:     General: Lids are normal.     Extraocular Movements: Extraocular movements intact.     Pupils: Pupils are equal, round, and reactive to light.     Funduscopic exam:    Right eye: No papilledema.        Left eye: No papilledema.  Neck:     Musculoskeletal: Full passive range of motion without pain, normal range of motion and neck supple.     Thyroid: No thyroid mass.     Vascular: No carotid bruit.  Cardiovascular:     Rate and Rhythm:  Normal rate and regular rhythm.     Pulses: Normal pulses.     Heart sounds: Normal heart sounds. No murmur.  Pulmonary:     Effort: Pulmonary effort is normal.     Breath sounds: Normal breath sounds.  Abdominal:     General: Abdomen is flat. Bowel sounds are normal.     Palpations: Abdomen is soft.  Musculoskeletal: Normal range of motion.        General: No swelling or tenderness.     Right lower leg: No edema.     Left lower leg: No edema.  Skin:    General: Skin is warm and dry.     Capillary Refill: Capillary refill takes less than 2 seconds.  Neurological:     General: No focal deficit present.     Mental Status: She is alert and oriented to person, place, and time.     Cranial Nerves: No cranial nerve deficit.     Sensory: No sensory deficit.  Psychiatric:        Mood and Affect: Mood normal.        Behavior: Behavior normal.        Thought Content: Thought content normal.        Judgment: Judgment normal.         Assessment And Plan:     1. Health maintenance examination . Behavior modifications discussed and diet history reviewed.   . Pt will continue to exercise regularly and modify diet with low GI, plant based foods and decrease intake of processed foods.  . Recommend intake of daily multivitamin, Vitamin D, and calcium.  . Recommend mammogram (up to date) and colonoscopy (up to date) for preventive screenings, as well as recommend immunizations that include influenza, TDAP  2. Essential hypertension . B/P is better still slightly elevated but better. Continue with your changes with your diet to limit salt intake. . CMP ordered to check renal function.  . The importance of regular exercise and dietary modification was stressed to the patient.  . Stressed importance of losing ten percent of her body weight to help with B/P control.  . The weight loss would help with decreasing cardiac and cancer risk as well.   3. Elevated cholesterol  Chronic,  stable  Continue with current medications - Lipid Profile - CMP14+EGFR  4. Vitamin D deficiency  Will check vitamin D level and supplement as needed.     Also encouraged to spend 15 minutes in the sun daily.  - Vitamin D (25 hydroxy)   [name], FNP    THE PATIENT IS ENCOURAGED TO PRACTICE SOCIAL DISTANCING DUE TO THE COVID-19 PANDEMIC.

## 2019-04-07 DIAGNOSIS — J301 Allergic rhinitis due to pollen: Secondary | ICD-10-CM | POA: Diagnosis not present

## 2019-04-07 DIAGNOSIS — J3089 Other allergic rhinitis: Secondary | ICD-10-CM | POA: Diagnosis not present

## 2019-04-07 LAB — LIPID PANEL
Chol/HDL Ratio: 2.6 ratio (ref 0.0–4.4)
Cholesterol, Total: 224 mg/dL — ABNORMAL HIGH (ref 100–199)
HDL: 86 mg/dL (ref >39)
LDL Chol Calc (NIH): 123 mg/dL — ABNORMAL HIGH (ref 0–99)
Triglycerides: 87 mg/dL (ref 0–149)
VLDL Cholesterol Cal: 15 mg/dL (ref 5–40)

## 2019-04-07 LAB — VITAMIN D 25 HYDROXY (VIT D DEFICIENCY, FRACTURES): Vit D, 25-Hydroxy: 49.7 ng/mL (ref 30.0–100.0)

## 2019-04-07 LAB — CMP14+EGFR
ALT: 17 IU/L (ref 0–32)
AST: 22 IU/L (ref 0–40)
Albumin/Globulin Ratio: 1.3 (ref 1.2–2.2)
Albumin: 4.4 g/dL (ref 3.7–4.7)
Alkaline Phosphatase: 87 IU/L (ref 39–117)
BUN/Creatinine Ratio: 20 (ref 12–28)
BUN: 16 mg/dL (ref 8–27)
Bilirubin Total: 0.4 mg/dL (ref 0.0–1.2)
CO2: 25 mmol/L (ref 20–29)
Calcium: 9.7 mg/dL (ref 8.7–10.3)
Chloride: 102 mmol/L (ref 96–106)
Creatinine, Ser: 0.8 mg/dL (ref 0.57–1.00)
GFR calc Af Amer: 84 mL/min/{1.73_m2} (ref >59)
GFR calc non Af Amer: 73 mL/min/{1.73_m2} (ref >59)
Globulin, Total: 3.3 g/dL (ref 1.5–4.5)
Glucose: 82 mg/dL (ref 65–99)
Potassium: 4.3 mmol/L (ref 3.5–5.2)
Sodium: 140 mmol/L (ref 134–144)
Total Protein: 7.7 g/dL (ref 6.0–8.5)

## 2019-04-19 DIAGNOSIS — J3089 Other allergic rhinitis: Secondary | ICD-10-CM | POA: Diagnosis not present

## 2019-04-19 DIAGNOSIS — J301 Allergic rhinitis due to pollen: Secondary | ICD-10-CM | POA: Diagnosis not present

## 2019-04-27 DIAGNOSIS — J3089 Other allergic rhinitis: Secondary | ICD-10-CM | POA: Diagnosis not present

## 2019-04-27 DIAGNOSIS — J301 Allergic rhinitis due to pollen: Secondary | ICD-10-CM | POA: Diagnosis not present

## 2019-05-03 ENCOUNTER — Other Ambulatory Visit: Payer: Self-pay | Admitting: Nurse Practitioner

## 2019-05-03 DIAGNOSIS — Z1231 Encounter for screening mammogram for malignant neoplasm of breast: Secondary | ICD-10-CM

## 2019-05-12 DIAGNOSIS — J301 Allergic rhinitis due to pollen: Secondary | ICD-10-CM | POA: Diagnosis not present

## 2019-05-12 DIAGNOSIS — J3089 Other allergic rhinitis: Secondary | ICD-10-CM | POA: Diagnosis not present

## 2019-05-23 DIAGNOSIS — J301 Allergic rhinitis due to pollen: Secondary | ICD-10-CM | POA: Diagnosis not present

## 2019-05-23 DIAGNOSIS — J3089 Other allergic rhinitis: Secondary | ICD-10-CM | POA: Diagnosis not present

## 2019-06-01 DIAGNOSIS — J301 Allergic rhinitis due to pollen: Secondary | ICD-10-CM | POA: Diagnosis not present

## 2019-06-01 DIAGNOSIS — J3089 Other allergic rhinitis: Secondary | ICD-10-CM | POA: Diagnosis not present

## 2019-06-09 DIAGNOSIS — J3089 Other allergic rhinitis: Secondary | ICD-10-CM | POA: Diagnosis not present

## 2019-06-09 DIAGNOSIS — J301 Allergic rhinitis due to pollen: Secondary | ICD-10-CM | POA: Diagnosis not present

## 2019-06-17 ENCOUNTER — Other Ambulatory Visit: Payer: Self-pay | Admitting: Nurse Practitioner

## 2019-06-17 DIAGNOSIS — I10 Essential (primary) hypertension: Secondary | ICD-10-CM

## 2019-06-22 ENCOUNTER — Ambulatory Visit: Payer: PPO

## 2019-06-23 ENCOUNTER — Ambulatory Visit
Admission: RE | Admit: 2019-06-23 | Discharge: 2019-06-23 | Disposition: A | Discharge status: Home / Self Care | Payer: Medicare HMO | Source: Ambulatory Visit | Attending: Nurse Practitioner | Admitting: Nurse Practitioner

## 2019-06-23 ENCOUNTER — Other Ambulatory Visit: Payer: Self-pay

## 2019-06-23 DIAGNOSIS — Z1231 Encounter for screening mammogram for malignant neoplasm of breast: Secondary | ICD-10-CM

## 2019-06-23 DIAGNOSIS — J3089 Other allergic rhinitis: Secondary | ICD-10-CM | POA: Diagnosis not present

## 2019-06-23 DIAGNOSIS — J301 Allergic rhinitis due to pollen: Secondary | ICD-10-CM | POA: Diagnosis not present

## 2019-06-30 DIAGNOSIS — J301 Allergic rhinitis due to pollen: Secondary | ICD-10-CM | POA: Diagnosis not present

## 2019-06-30 DIAGNOSIS — J3089 Other allergic rhinitis: Secondary | ICD-10-CM | POA: Diagnosis not present

## 2019-06-30 DIAGNOSIS — J3081 Allergic rhinitis due to animal (cat) (dog) hair and dander: Secondary | ICD-10-CM | POA: Diagnosis not present

## 2019-07-07 ENCOUNTER — Other Ambulatory Visit: Payer: Self-pay

## 2019-07-07 ENCOUNTER — Ambulatory Visit (INDEPENDENT_AMBULATORY_CARE_PROVIDER_SITE_OTHER): Payer: Medicare HMO

## 2019-07-07 ENCOUNTER — Encounter: Payer: Self-pay | Admitting: Nurse Practitioner

## 2019-07-07 ENCOUNTER — Ambulatory Visit (INDEPENDENT_AMBULATORY_CARE_PROVIDER_SITE_OTHER): Payer: Medicare HMO | Admitting: Nurse Practitioner

## 2019-07-07 VITALS — BP 130/82 | HR 75 | Temp 98.5°F | Ht <= 58 in | Wt 158.0 lb

## 2019-07-07 VITALS — BP 130/82 | HR 75 | Temp 98.5°F | Ht <= 58 in | Wt 158.8 lb

## 2019-07-07 DIAGNOSIS — Z0000 Encounter for general adult medical examination without abnormal findings: Secondary | ICD-10-CM | POA: Diagnosis not present

## 2019-07-07 DIAGNOSIS — I10 Essential (primary) hypertension: Secondary | ICD-10-CM

## 2019-07-07 DIAGNOSIS — E559 Vitamin D deficiency, unspecified: Secondary | ICD-10-CM | POA: Diagnosis not present

## 2019-07-07 DIAGNOSIS — E7800 Pure hypercholesterolemia, unspecified: Secondary | ICD-10-CM

## 2019-07-07 NOTE — Patient Instructions (Addendum)
Ms. Valladolid , Thank you for taking time to come for your Medicare Wellness Visit. I appreciate your ongoing commitment to your health goals. Please review the following plan we discussed and let me know if I can assist you in the future.   Screening recommendations/referrals: Colonoscopy: 11/2014 Mammogram: 05/2019 Bone Density: postponed Recommended yearly ophthalmology/optometry visit for glaucoma screening and checkup Recommended yearly dental visit for hygiene and checkup  Vaccinations: Influenza vaccine: 12/2018 Pneumococcal vaccine: postponed Tdap vaccine: 01/2018 Shingles vaccine: 12/2017    Advanced directives: Please bring a copy of your POA (Power of [place]) and/or Living Will to your next appointment.    Conditions/risks identified: obesity  Next appointment: 11/07/2019 at 2:45   Preventive Care 75 Years and Older, Female Preventive care refers to lifestyle choices and visits with your health care provider that can promote health and wellness. What does preventive care include?  A yearly physical exam. This is also called an annual well check.  Dental exams once or twice a year.  Routine eye exams. Ask your health care provider how often you should have your eyes checked.  Personal lifestyle choices, including:  Daily care of your teeth and gums.  Regular physical activity.  Eating a healthy diet.  Avoiding tobacco and drug use.  Limiting alcohol use.  Practicing safe sex.  Taking low-dose aspirin every day.  Taking vitamin and mineral supplements as recommended by your health care provider. What happens during an annual well check? The services and screenings done by your health care provider during your annual well check will depend on your age, overall health, lifestyle risk factors, and family history of disease. Counseling  Your health care provider may ask you questions about your:  Alcohol use.  Tobacco use.  Drug use.  Emotional  well-being.  Home and relationship well-being.  Sexual activity.  Eating habits.  History of falls.  Memory and ability to understand (cognition).  Work and work Statistician.  Reproductive health. Screening  You may have the following tests or measurements:  Height, weight, and BMI.  Blood pressure.  Lipid and cholesterol levels. These may be checked every 5 years, or more frequently if you are over 47 years old.  Skin check.  Lung cancer screening. You may have this screening every year starting at age 52 if you have a 30-pack-year history of smoking and currently smoke or have quit within the past 15 years.  Fecal occult blood test (FOBT) of the stool. You may have this test every year starting at age 78.  Flexible sigmoidoscopy or colonoscopy. You may have a sigmoidoscopy every 5 years or a colonoscopy every 10 years starting at age 66.  Hepatitis C blood test.  Hepatitis B blood test.  Sexually transmitted disease (STD) testing.  Diabetes screening. This is done by checking your blood sugar (glucose) after you have not eaten for a while (fasting). You may have this done every 1-3 years.  Bone density scan. This is done to screen for osteoporosis. You may have this done starting at age 59.  Mammogram. This may be done every 1-2 years. Talk to your health care provider about how often you should have regular mammograms. Talk with your health care provider about your test results, treatment options, and if necessary, the need for more tests. Vaccines  Your health care provider may recommend certain vaccines, such as:  Influenza vaccine. This is recommended every year.  Tetanus, diphtheria, and acellular pertussis (Tdap, Td) vaccine. You may need a Td booster  every 10 years.  Zoster vaccine. You may need this after age 80.  Pneumococcal 13-valent conjugate (PCV13) vaccine. One dose is recommended after age 88.  Pneumococcal polysaccharide (PPSV23) vaccine. One  dose is recommended after age 62. Talk to your health care provider about which screenings and vaccines you need and how often you need them. This information is not intended to replace advice given to you by your health care provider. Make sure you discuss any questions you have with your health care provider. Document Released: 06/08/2015 Document Revised: 01/30/2016 Document Reviewed: 03/13/2015 Elsevier Interactive Patient Education  2017 [place] Prevention in the Home Falls can cause injuries. They can happen to people of all ages. There are many things you can do to make your home safe and to help prevent falls. What can I do on the outside of my home?  Regularly fix the edges of walkways and driveways and fix any cracks.  Remove anything that might make you trip as you walk through a door, such as a raised step or threshold.  Trim any bushes or trees on the path to your home.  Use bright outdoor lighting.  Clear any walking paths of anything that might make someone trip, such as rocks or tools.  Regularly check to see if handrails are loose or broken. Make sure that both sides of any steps have handrails.  Any raised decks and porches should have guardrails on the edges.  Have any leaves, snow, or ice cleared regularly.  Use sand or salt on walking paths during winter.  Clean up any spills in your garage right away. This includes oil or grease spills. What can I do in the bathroom?  Use night lights.  Install grab bars by the toilet and in the tub and shower. Do not use towel bars as grab bars.  Use non-skid mats or decals in the tub or shower.  If you need to sit down in the shower, use a plastic, non-slip stool.  Keep the floor dry. Clean up any water that spills on the floor as soon as it happens.  Remove soap buildup in the tub or shower regularly.  Attach bath mats securely with double-sided non-slip rug tape.  Do not have throw rugs and other  things on the floor that can make you trip. What can I do in the bedroom?  Use night lights.  Make sure that you have a light by your bed that is easy to reach.  Do not use any sheets or blankets that are too big for your bed. They should not hang down onto the floor.  Have a firm chair that has side arms. You can use this for support while you get dressed.  Do not have throw rugs and other things on the floor that can make you trip. What can I do in the kitchen?  Clean up any spills right away.  Avoid walking on wet floors.  Keep items that you use a lot in easy-to-reach places.  If you need to reach something above you, use a strong step stool that has a grab bar.  Keep electrical cords out of the way.  Do not use floor polish or wax that makes floors slippery. If you must use wax, use non-skid floor wax.  Do not have throw rugs and other things on the floor that can make you trip. What can I do with my stairs?  Do not leave any items on the stairs.  Make  sure that there are handrails on both sides of the stairs and use them. Fix handrails that are broken or loose. Make sure that handrails are as long as the stairways.  Check any carpeting to make sure that it is firmly attached to the stairs. Fix any carpet that is loose or worn.  Avoid having throw rugs at the top or bottom of the stairs. If you do have throw rugs, attach them to the floor with carpet tape.  Make sure that you have a light switch at the top of the stairs and the bottom of the stairs. If you do not have them, ask someone to add them for you. What else can I do to help prevent falls?  Wear shoes that:  Do not have high heels.  Have rubber bottoms.  Are comfortable and fit you well.  Are closed at the toe. Do not wear sandals.  If you use a stepladder:  Make sure that it is fully opened. Do not climb a closed stepladder.  Make sure that both sides of the stepladder are locked into place.  Ask  someone to hold it for you, if possible.  Clearly mark and make sure that you can see:  Any grab bars or handrails.  First and last steps.  Where the edge of each step is.  Use tools that help you move around (mobility aids) if they are needed. These include:  Canes.  Walkers.  Scooters.  Crutches.  Turn on the lights when you go into a dark area. Replace any light bulbs as soon as they burn out.  Set up your furniture so you have a clear path. Avoid moving your furniture around.  If any of your floors are uneven, fix them.  If there are any pets around you, be aware of where they are.  Review your medicines with your doctor. Some medicines can make you feel dizzy. This can increase your chance of falling. Ask your doctor what other things that you can do to help prevent falls. This information is not intended to replace advice given to you by your health care provider. Make sure you discuss any questions you have with your health care provider. Document Released: 03/08/2009 Document Revised: 10/18/2015 Document Reviewed: 06/16/2014 Elsevier Interactive Patient Education  2017 [employer].

## 2019-07-07 NOTE — Progress Notes (Signed)
Subjective:     Patient ID: Linda Moreno , female    DOB: 18-Jul-1945 , 74 y.o.   MRN: [id number]   Chief Complaint  Patient presents with  . Hypertension    HPI  She is planning to get her Covid vaccine on Saturday.   Hypertension This is a chronic problem. The current episode started more than 1 year ago. The problem has been gradually improving since onset. The problem is uncontrolled. Pertinent negatives include no anxiety, blurred vision, chest pain, headaches or palpitations. Agents associated with hypertension include NSAIDs. Risk factors for coronary artery disease include obesity and sedentary lifestyle. Past treatments include nothing. There are no compliance problems.  There is no history of angina or kidney disease. There is no history of chronic renal disease.     Past Medical History:  Diagnosis Date  . Asthma      Family History  Problem Relation Age of Onset  . Diabetes Father      Current Outpatient Medications:  .  albuterol (PROVENTIL HFA;VENTOLIN HFA) 108 (90 Base) MCG/ACT inhaler, Inhale 2 puffs into the lungs every 4 (four) hours as needed for wheezing or shortness of breath., Disp: , Rfl:  .  amLODipine (NORVASC) 10 MG tablet, TAKE 1 TABLET BY MOUTH EVERY DAY, Disp: 90 tablet, Rfl: 0 .  calcium citrate (CALCITRATE - DOSED IN MG ELEMENTAL CALCIUM) 950 MG tablet, Take 200 mg of elemental calcium by mouth daily., Disp: , Rfl:  .  cetirizine (ZYRTEC ALLERGY) 10 MG tablet, Take 1 tablet (10 mg total) by mouth daily., Disp: 1 tablet, Rfl: 0 .  fluticasone furoate-vilanterol (BREO ELLIPTA) 100-25 MCG/INH AEPB, Inhale 1 puff into the lungs daily., Disp: , Rfl:  .  ibandronate (BONIVA) 150 MG tablet, TAKE 1 TABLET ORALLY ONCE MONTHLY WITH 8-10 OZ WATER. SIT UPRIGHT AND NO FOOD/DRINK FOR 1 HOUR., Disp: , Rfl: 11 .  montelukast (SINGULAIR) 10 MG tablet, Take 1 tablet (10 mg total) by mouth daily., Disp: 1 tablet, Rfl: 0 .  multivitamin-iron-minerals-folic acid  (CENTRUM) chewable tablet, Chew 1 tablet by mouth daily., Disp: , Rfl:    No Known Allergies   Review of Systems  Constitutional: Negative for fatigue and fever.  Eyes: Negative for blurred vision.  Respiratory: Negative.   Cardiovascular: Negative for chest pain, palpitations and leg swelling.  Neurological: Negative for dizziness and headaches.  Psychiatric/Behavioral: Negative.      Today's Vitals   07/07/19 1409  BP: 130/82  Pulse: 75  Temp: 98.5 F (36.9 C)  TempSrc: Oral  Weight: 158 lb 12.8 oz (72 kg)  Height: _0  (1.448 m)  PainSc: 0-No pain   Body mass index is 34.36 kg/m.   Objective:  Physical Exam Vitals reviewed.  Constitutional:      Appearance: Normal appearance.  Cardiovascular:     Rate and Rhythm: Normal rate and regular rhythm.     Pulses: Normal pulses.     Heart sounds: Normal heart sounds. No murmur.  Pulmonary:     Effort: Pulmonary effort is normal. No respiratory distress.     Breath sounds: Normal breath sounds.  Skin:    Capillary Refill: Capillary refill takes less than 2 seconds.  Neurological:     General: No focal deficit present.     Mental Status: She is alert and oriented to person, place, and time.  Psychiatric:        Mood and Affect: Mood normal.        Behavior: Behavior normal.  Thought Content: Thought content normal.        Judgment: Judgment normal.         Assessment And Plan:     1. Essential hypertension . B/P is much better controlled.  . CMP ordered to check renal function.  . The importance of regular exercise and dietary modification was stressed to the patient.  Marland Kitchen Congratulated on doing better with  - CMP14+EGFR  2. Elevated cholesterol  Slightly elevated cholesterol levels  Will recheck today, pending results she may need a low dose medication.  - Lipid panel  3. Vitamin D deficiency  Will check vitamin D level and supplement as needed.     Also encouraged to spend 15 minutes in the sun  daily.   No labs this visit for vitamin d was normal at last visit   [name], FNP    THE PATIENT IS ENCOURAGED TO PRACTICE SOCIAL DISTANCING DUE TO THE COVID-19 PANDEMIC.

## 2019-07-07 NOTE — Progress Notes (Signed)
This visit occurred during the SARS-CoV-2 public health emergency.  Safety protocols were in place, including screening questions prior to the visit, additional usage of staff PPE, and extensive cleaning of exam room while observing appropriate contact time as indicated for disinfecting solutions.  Subjective:   Elvina Ran Wessells is a 74 y.o. female who presents for Medicare Annual (Subsequent) preventive examination.  Review of Systems:  n/a Cardiac Risk Factors include: advanced age (>83men, >30 women);obesity (BMI >30kg/m2)     Objective:     Vitals: BP 130/82 (BP Location: Left Arm, Patient Position: Sitting)   Pulse 75   Temp 98.5 F (36.9 C) (Oral)   Ht 4\' 9"  (1.448 m)   Wt 158 lb (71.7 kg)   BMI 34.19 kg/m   Body mass index is 34.19 kg/m.  Advanced Directives 07/07/2019 04/06/2019 04/01/2018  Does Patient Have a Medical Advance Directive? Yes No No  Type of Paramedic of [place];Living will - -  Copy of [place] in Chart? No - copy requested - -  Would patient like information on creating a medical advance directive? - - No - Patient declined    Tobacco Social History   Tobacco Use  Smoking Status Never Smoker  Smokeless Tobacco Never Used     Counseling given: Not Answered   Clinical Intake:  Pre-visit preparation completed: Yes  Pain : No/denies pain     Nutritional Status: BMI > 30  Obese Nutritional Risks: None Diabetes: No  How often do you need to have someone help you when you read instructions, pamphlets, or other written materials from your doctor or pharmacy?: 1 - Never What is the last grade level you completed in school?: 12th grade  Interpreter Needed?: No  Information entered by :: NAllen LPN  Past Medical History:  Diagnosis Date  . Asthma    History reviewed. No pertinent surgical history. Family History  Problem Relation Age of Onset  . Diabetes Father    Social History    Socioeconomic History  . Marital status: Married    Spouse name: Not on file  . Number of children: Not on file  . Years of education: Not on file  . Highest education level: Not on file  Occupational History  . Occupation: retired  Tobacco Use  . Smoking status: Never Smoker  . Smokeless tobacco: Never Used  Substance and Sexual Activity  . Alcohol use: No  . Drug use: No  . Sexual activity: Yes  Other Topics Concern  . Not on file  Social History Narrative  . Not on file   Social Determinants of Health   Financial Resource Strain: Low Risk   . Difficulty of Paying Living Expenses: Not hard at all  Food Insecurity: No Food Insecurity  . Worried About Charity fundraiser in the Last Year: Never true  . Ran Out of Food in the Last Year: Never true  Transportation Needs: No Transportation Needs  . Lack of Transportation (Medical): No  . Lack of Transportation (Non-Medical): No  Physical Activity: Sufficiently Active  . Days of Exercise per Week: 7 days  . Minutes of Exercise per Session: 30 min  Stress: No Stress Concern Present  . Feeling of Stress : Not at all  Social Connections:   . Frequency of Communication with Friends and Family: Not on file  . Frequency of Social Gatherings with Friends and Family: Not on file  . Attends Religious Services: Not on file  .  Active Member of Clubs or Organizations: Not on file  . Attends Archivist Meetings: Not on file  . Marital Status: Not on file    Outpatient Encounter Medications as of 07/07/2019  Medication Sig  . albuterol (PROVENTIL HFA;VENTOLIN HFA) 108 (90 Base) MCG/ACT inhaler Inhale 2 puffs into the lungs every 4 (four) hours as needed for wheezing or shortness of breath.  Marland Kitchen amLODipine (NORVASC) 10 MG tablet TAKE 1 TABLET BY MOUTH EVERY DAY  . calcium citrate (CALCITRATE - DOSED IN MG ELEMENTAL CALCIUM) 950 MG tablet Take 200 mg of elemental calcium by mouth daily.  . cetirizine (ZYRTEC ALLERGY) 10 MG  tablet Take 1 tablet (10 mg total) by mouth daily.  . fluticasone furoate-vilanterol (BREO ELLIPTA) 100-25 MCG/INH AEPB Inhale 1 puff into the lungs daily.  Marland Kitchen ibandronate (BONIVA) 150 MG tablet TAKE 1 TABLET ORALLY ONCE MONTHLY WITH 8-10 OZ WATER. SIT UPRIGHT AND NO FOOD/DRINK FOR 1 HOUR.  . montelukast (SINGULAIR) 10 MG tablet Take 1 tablet (10 mg total) by mouth daily.  . multivitamin-iron-minerals-folic acid (CENTRUM) chewable tablet Chew 1 tablet by mouth daily.   No facility-administered encounter medications on file as of 07/07/2019.    Activities of Daily Living In your present state of health, do you have any difficulty performing the following activities: 07/07/2019 04/06/2019  Hearing? N N  Vision? N N  Difficulty concentrating or making decisions? N N  Walking or climbing stairs? N N  Dressing or bathing? N N  Doing errands, shopping? N N  Preparing Food and eating ? N N  Using the Toilet? N N  In the past six months, have you accidently leaked urine? N N  Do you have problems with loss of bowel control? N N  Managing your Medications? N N  Managing your Finances? N N  Housekeeping or managing your Housekeeping? N N  Some recent data might be hidden    Patient Care Team: [name], FNP as PCP - General (General Practice)    Assessment:   This is a routine wellness examination for Dorianne.  Exercise Activities and Dietary recommendations Current Exercise Habits: Home exercise routine, Type of exercise: calisthenics, Time (Minutes): 30, Frequency (Times/Week): 7, Weekly Exercise (Minutes/Week): 210  Goals    . Weight (lb) < 200 lb (90.7 kg) (pt-stated)     Wants to lose 15 pounds    . Weight (lb) < 200 lb (90.7 kg)     04/06/2019, wants to lose 10 pounds    . Weight (lb) < 200 lb (90.7 kg)     07/07/2019, wants to lose 20 pounds       Fall Risk Fall Risk  07/07/2019 07/07/2019 04/06/2019 03/08/2019 02/07/2019  Falls in the past year? 0 0 0 0 0  Number falls in  past yr: - - 0 - -  Risk for fall due to : Medication side effect - Medication side effect - -  Follow up Falls evaluation completed;Education provided;Falls prevention discussed - Falls evaluation completed;Education provided;Falls prevention discussed - -   Is the patient's home free of loose throw rugs in walkways, pet beds, electrical cords, etc?   yes      Grab bars in the bathroom? yes      Handrails on the stairs?   yes      Adequate lighting?   yes  Timed Get Up and Go performed: n/a  Depression Screen PHQ 2/9 Scores 07/07/2019 07/07/2019 04/06/2019 03/08/2019  PHQ - 2 Score 0 0  0 0  PHQ- 9 Score 0 - - -     Cognitive Function     6CIT Screen 07/07/2019 04/06/2019 04/01/2018  What Year? 0 points 0 points 0 points  What month? 0 points 0 points 0 points  What time? 0 points 0 points 0 points  Count back from 20 0 points 0 points 0 points  Months in reverse 0 points 0 points 0 points  Repeat phrase 2 points 8 points 2 points  Total Score 2 8 2     Immunization History  Administered Date(s) Administered  . Influenza, High Dose Seasonal PF 02/15/2018  . Influenza-Unspecified 01/07/2019  . Tdap 02/15/2018  . Zoster Recombinat (Shingrix) 08/12/2017, 12/28/2017    Qualifies for Shingles Vaccine? yes  Screening Tests Health Maintenance  Topic Date Due  . DEXA SCAN  04/05/2020 (Originally 06/08/2009)  . PNA vac Low Risk Adult (2 of 2 - PPSV23) 04/05/2020 (Originally 03/16/2015)  . COLONOSCOPY  12/10/2024  . TETANUS/TDAP  02/16/2028  . INFLUENZA VACCINE  Completed  . Hepatitis C Screening  Completed    Cancer Screenings: Lung: Low Dose CT Chest recommended if Age 43-80 years, 30 pack-year currently smoking OR have quit w/in 15years. Patient does not qualify. Breast:  Up to date on Mammogram? Yes   Up to date of Bone Density/Dexa? Yes Colorectal: up to date  Additional Screenings: : Hepatitis C Screening: 01/10/2019     Plan:    Patient wants to lose 20 pounds.    I have personally reviewed and noted the following in the patient's chart:   . Medical and social history . Use of alcohol, tobacco or illicit drugs  . Current medications and supplements . Functional ability and status . Nutritional status . Physical activity . Advanced directives . List of other physicians . Hospitalizations, surgeries, and ER visits in previous 12 months . Vitals . Screenings to include cognitive, depression, and falls . Referrals and appointments  In addition, I have reviewed and discussed with patient certain preventive protocols, quality metrics, and best practice recommendations. A written personalized care plan for preventive services as well as general preventive health recommendations were provided to patient.     Kellie Simmering, LPN  [id number]

## 2019-07-13 DIAGNOSIS — J301 Allergic rhinitis due to pollen: Secondary | ICD-10-CM | POA: Diagnosis not present

## 2019-07-13 DIAGNOSIS — J3089 Other allergic rhinitis: Secondary | ICD-10-CM | POA: Diagnosis not present

## 2019-07-28 DIAGNOSIS — J3089 Other allergic rhinitis: Secondary | ICD-10-CM | POA: Diagnosis not present

## 2019-07-28 DIAGNOSIS — J301 Allergic rhinitis due to pollen: Secondary | ICD-10-CM | POA: Diagnosis not present

## 2019-08-02 ENCOUNTER — Other Ambulatory Visit: Payer: Medicare HMO

## 2019-08-02 ENCOUNTER — Other Ambulatory Visit: Payer: Self-pay

## 2019-08-02 DIAGNOSIS — I10 Essential (primary) hypertension: Secondary | ICD-10-CM | POA: Diagnosis not present

## 2019-08-02 DIAGNOSIS — E7800 Pure hypercholesterolemia, unspecified: Secondary | ICD-10-CM | POA: Diagnosis not present

## 2019-08-03 LAB — CMP14+EGFR
ALT: 13 IU/L (ref 0–32)
AST: 24 IU/L (ref 0–40)
Albumin/Globulin Ratio: 1.4 (ref 1.2–2.2)
Albumin: 4.6 g/dL (ref 3.7–4.7)
Alkaline Phosphatase: 93 IU/L (ref 39–117)
BUN/Creatinine Ratio: 19 (ref 12–28)
BUN: 14 mg/dL (ref 8–27)
Bilirubin Total: 0.5 mg/dL (ref 0.0–1.2)
CO2: 24 mmol/L (ref 20–29)
Calcium: 9.3 mg/dL (ref 8.7–10.3)
Chloride: 102 mmol/L (ref 96–106)
Creatinine, Ser: 0.74 mg/dL (ref 0.57–1.00)
GFR calc Af Amer: 92 mL/min/{1.73_m2} (ref >59)
GFR calc non Af Amer: 80 mL/min/{1.73_m2} (ref >59)
Globulin, Total: 3.2 g/dL (ref 1.5–4.5)
Glucose: 104 mg/dL — ABNORMAL HIGH (ref 65–99)
Potassium: 4.1 mmol/L (ref 3.5–5.2)
Sodium: 141 mmol/L (ref 134–144)
Total Protein: 7.8 g/dL (ref 6.0–8.5)

## 2019-08-03 LAB — LIPID PANEL
Chol/HDL Ratio: 2.7 ratio (ref 0.0–4.4)
Cholesterol, Total: 215 mg/dL — ABNORMAL HIGH (ref 100–199)
HDL: 79 mg/dL (ref >39)
LDL Chol Calc (NIH): 127 mg/dL — ABNORMAL HIGH (ref 0–99)
Triglycerides: 53 mg/dL (ref 0–149)
VLDL Cholesterol Cal: 9 mg/dL (ref 5–40)

## 2019-08-04 DIAGNOSIS — J301 Allergic rhinitis due to pollen: Secondary | ICD-10-CM | POA: Diagnosis not present

## 2019-08-04 DIAGNOSIS — J3089 Other allergic rhinitis: Secondary | ICD-10-CM | POA: Diagnosis not present

## 2019-08-08 ENCOUNTER — Other Ambulatory Visit: Payer: Self-pay

## 2019-08-08 DIAGNOSIS — I10 Essential (primary) hypertension: Secondary | ICD-10-CM

## 2019-08-08 MED ORDER — AMLODIPINE BESYLATE 10 MG PO TABS: 10.0000 mg | ORAL_TABLET | Freq: Every day | ORAL | 0 refills | Status: DC

## 2019-08-18 DIAGNOSIS — J301 Allergic rhinitis due to pollen: Secondary | ICD-10-CM | POA: Diagnosis not present

## 2019-08-18 DIAGNOSIS — J3089 Other allergic rhinitis: Secondary | ICD-10-CM | POA: Diagnosis not present

## 2019-09-07 DIAGNOSIS — J301 Allergic rhinitis due to pollen: Secondary | ICD-10-CM | POA: Diagnosis not present

## 2019-09-07 DIAGNOSIS — J3089 Other allergic rhinitis: Secondary | ICD-10-CM | POA: Diagnosis not present

## 2019-09-07 DIAGNOSIS — J3081 Allergic rhinitis due to animal (cat) (dog) hair and dander: Secondary | ICD-10-CM | POA: Diagnosis not present

## 2019-09-22 DIAGNOSIS — J3081 Allergic rhinitis due to animal (cat) (dog) hair and dander: Secondary | ICD-10-CM | POA: Diagnosis not present

## 2019-09-22 DIAGNOSIS — J3089 Other allergic rhinitis: Secondary | ICD-10-CM | POA: Diagnosis not present

## 2019-09-22 DIAGNOSIS — J301 Allergic rhinitis due to pollen: Secondary | ICD-10-CM | POA: Diagnosis not present

## 2019-09-23 ENCOUNTER — Other Ambulatory Visit: Payer: Self-pay | Admitting: Nurse Practitioner

## 2019-09-23 DIAGNOSIS — I10 Essential (primary) hypertension: Secondary | ICD-10-CM

## 2019-10-07 DIAGNOSIS — J301 Allergic rhinitis due to pollen: Secondary | ICD-10-CM | POA: Diagnosis not present

## 2019-10-07 DIAGNOSIS — J3089 Other allergic rhinitis: Secondary | ICD-10-CM | POA: Diagnosis not present

## 2019-10-21 DIAGNOSIS — J301 Allergic rhinitis due to pollen: Secondary | ICD-10-CM | POA: Diagnosis not present

## 2019-10-21 DIAGNOSIS — J3089 Other allergic rhinitis: Secondary | ICD-10-CM | POA: Diagnosis not present

## 2019-11-02 ENCOUNTER — Ambulatory Visit (INDEPENDENT_AMBULATORY_CARE_PROVIDER_SITE_OTHER): Payer: Medicare HMO | Admitting: Nurse Practitioner

## 2019-11-02 ENCOUNTER — Encounter: Payer: Self-pay | Admitting: Nurse Practitioner

## 2019-11-02 VITALS — BP 128/84 | HR 79 | Temp 98.3°F | Ht <= 58 in | Wt 151.0 lb

## 2019-11-02 DIAGNOSIS — I10 Essential (primary) hypertension: Secondary | ICD-10-CM | POA: Diagnosis not present

## 2019-11-02 DIAGNOSIS — J3089 Other allergic rhinitis: Secondary | ICD-10-CM | POA: Diagnosis not present

## 2019-11-02 DIAGNOSIS — J301 Allergic rhinitis due to pollen: Secondary | ICD-10-CM | POA: Diagnosis not present

## 2019-11-02 DIAGNOSIS — E7800 Pure hypercholesterolemia, unspecified: Secondary | ICD-10-CM

## 2019-11-02 NOTE — Progress Notes (Signed)
Subjective:     Patient ID: Linda Moreno , female    DOB: [birth year] , 74 y.o.   MRN: [id number]   Chief Complaint  Patient presents with  . Hypertension    HPI  Linda Moreno present today for here 4 month HTN follow up. She reports that she is doing well staying away from hidden salt and she is staying hydrated. She drinks 8 glasses of water a day and is eating more vegetables and dry beans and watching the cholesterol. She exercises as much as she can, she is taking care of her grandchildren. She does walk in her spare times. She doesn't eat lots of sweet. She denies any side effects with her medications and did COVID vaccination and did well. She is down seven pound with a goal of losing 10 more pounds this is an intentional weight loss. She was congratulated for the good work.   Wt Readings from Last 3 Encounters: 11/02/19 : 151 lb (68.5 kg) 07/07/19 : 158 lb (71.7 kg) 07/07/19 : 158 lb 12.8 oz (72 kg)  She has purchased an air fryer, bakes and broils  Hypertension This is a chronic problem. The current episode started more than 1 year ago. The problem has been gradually improving since onset. The problem is controlled. Pertinent negatives include no anxiety, chest pain, headaches, palpitations or shortness of breath. Risk factors for coronary artery disease include obesity and sedentary lifestyle. Past treatments include calcium channel blockers. The current treatment provides significant improvement. There are no compliance problems.  There is no history of angina or kidney disease. There is no history of chronic renal disease.     Past Medical History:  Diagnosis Date  . Asthma      Family History  Problem Relation Age of Onset  . Diabetes Father      Current Outpatient Medications:  .  albuterol (PROVENTIL HFA;VENTOLIN HFA) 108 (90 Base) MCG/ACT inhaler, Inhale 2 puffs into the lungs every 4 (four) hours as needed for wheezing or shortness of breath., Disp: , Rfl:  .   amLODipine (NORVASC) 10 MG tablet, TAKE 1 TABLET BY MOUTH EVERY DAY, Disp: 90 tablet, Rfl: 0 .  calcium citrate (CALCITRATE - DOSED IN MG ELEMENTAL CALCIUM) 950 MG tablet, Take 200 mg of elemental calcium by mouth daily., Disp: , Rfl:  .  cetirizine (ZYRTEC ALLERGY) 10 MG tablet, Take 1 tablet (10 mg total) by mouth daily., Disp: 1 tablet, Rfl: 0 .  fluticasone furoate-vilanterol (BREO ELLIPTA) 100-25 MCG/INH AEPB, Inhale 1 puff into the lungs daily., Disp: , Rfl:  .  ibandronate (BONIVA) 150 MG tablet, TAKE 1 TABLET ORALLY ONCE MONTHLY WITH 8-10 OZ WATER. SIT UPRIGHT AND NO FOOD/DRINK FOR 1 HOUR., Disp: , Rfl: 11 .  montelukast (SINGULAIR) 10 MG tablet, Take 1 tablet (10 mg total) by mouth daily., Disp: 1 tablet, Rfl: 0 .  multivitamin-iron-minerals-folic acid (CENTRUM) chewable tablet, Chew 1 tablet by mouth daily., Disp: , Rfl:    No Known Allergies   Review of Systems  Constitutional: Negative.   Respiratory: Negative.  Negative for shortness of breath.   Cardiovascular: Negative.  Negative for chest pain, palpitations and leg swelling.  Endocrine: Negative.   Musculoskeletal: Negative.  Negative for arthralgias and myalgias.  Allergic/Immunologic: Negative.   Neurological: Negative.  Negative for headaches.  Hematological: Negative.      Today's Vitals   11/02/19 0846  BP: 128/84  Pulse: 79  Temp: 98.3 F (36.8 C)  TempSrc: Oral  Weight: 151  lb (68.5 kg)  Height: 4' 9.4" (1.458 m)  PainSc: 0-No pain   Body mass index is 32.22 kg/m.   Objective:  Physical Exam Constitutional:      Appearance: Normal appearance. She is normal weight.  Cardiovascular:     Rate and Rhythm: Normal rate and regular rhythm.     Pulses: Normal pulses.     Heart sounds: Normal heart sounds.  Musculoskeletal:        General: No swelling. Normal range of motion.  Skin:    General: Skin is warm and dry.  Neurological:     General: No focal deficit present.     Mental Status: She is alert and  oriented to person, place, and time.  Psychiatric:        Mood and Affect: Mood normal.        Behavior: Behavior normal.        Thought Content: Thought content normal.        Judgment: Judgment normal.         Assessment And Plan:   1. Essential hypertension  Chronic, good control  Continue with current medications  Continue with avoiding hidden salt  2. Elevated cholesterol  Improved at last visit   Currently diet controlled  Will recheck levels today - Lipid panel  Linda Lund, RN   [name], DNP, FNP-BC   THE PATIENT IS ENCOURAGED TO PRACTICE SOCIAL DISTANCING DUE TO THE COVID-19 PANDEMIC.

## 2019-11-03 LAB — LIPID PANEL
Chol/HDL Ratio: 3 ratio (ref 0.0–4.4)
Cholesterol, Total: 210 mg/dL — ABNORMAL HIGH (ref 100–199)
HDL: 71 mg/dL (ref >39)
LDL Chol Calc (NIH): 129 mg/dL — ABNORMAL HIGH (ref 0–99)
Triglycerides: 56 mg/dL (ref 0–149)
VLDL Cholesterol Cal: 10 mg/dL (ref 5–40)

## 2019-11-04 ENCOUNTER — Other Ambulatory Visit: Payer: Self-pay | Admitting: Nurse Practitioner

## 2019-11-04 DIAGNOSIS — E782 Mixed hyperlipidemia: Secondary | ICD-10-CM

## 2019-11-04 MED ORDER — ATORVASTATIN CALCIUM 10 MG PO TABS: 10.0000 mg | ORAL_TABLET | Freq: Every day | ORAL | 2 refills | Status: DC

## 2019-11-07 ENCOUNTER — Ambulatory Visit: Payer: Medicare HMO | Admitting: Nurse Practitioner

## 2019-11-14 DIAGNOSIS — M816 Localized osteoporosis [Lequesne]: Secondary | ICD-10-CM | POA: Diagnosis not present

## 2019-11-14 DIAGNOSIS — N958 Other specified menopausal and perimenopausal disorders: Secondary | ICD-10-CM | POA: Diagnosis not present

## 2019-11-14 LAB — HM DEXA SCAN

## 2019-11-16 DIAGNOSIS — J3089 Other allergic rhinitis: Secondary | ICD-10-CM | POA: Diagnosis not present

## 2019-11-16 DIAGNOSIS — J301 Allergic rhinitis due to pollen: Secondary | ICD-10-CM | POA: Diagnosis not present

## 2019-11-22 DIAGNOSIS — J301 Allergic rhinitis due to pollen: Secondary | ICD-10-CM | POA: Diagnosis not present

## 2019-11-22 DIAGNOSIS — J3089 Other allergic rhinitis: Secondary | ICD-10-CM | POA: Diagnosis not present

## 2019-12-07 ENCOUNTER — Other Ambulatory Visit: Payer: Self-pay | Admitting: Nurse Practitioner

## 2019-12-07 DIAGNOSIS — I10 Essential (primary) hypertension: Secondary | ICD-10-CM

## 2019-12-21 DIAGNOSIS — Z91013 Allergy to seafood: Secondary | ICD-10-CM | POA: Diagnosis not present

## 2019-12-21 DIAGNOSIS — J301 Allergic rhinitis due to pollen: Secondary | ICD-10-CM | POA: Diagnosis not present

## 2019-12-21 DIAGNOSIS — J4530 Mild persistent asthma, uncomplicated: Secondary | ICD-10-CM | POA: Diagnosis not present

## 2019-12-21 DIAGNOSIS — J3089 Other allergic rhinitis: Secondary | ICD-10-CM | POA: Diagnosis not present

## 2020-01-02 DIAGNOSIS — J301 Allergic rhinitis due to pollen: Secondary | ICD-10-CM | POA: Diagnosis not present

## 2020-01-02 DIAGNOSIS — J3089 Other allergic rhinitis: Secondary | ICD-10-CM | POA: Diagnosis not present

## 2020-01-20 DIAGNOSIS — J301 Allergic rhinitis due to pollen: Secondary | ICD-10-CM | POA: Diagnosis not present

## 2020-01-20 DIAGNOSIS — J3089 Other allergic rhinitis: Secondary | ICD-10-CM | POA: Diagnosis not present

## 2020-01-29 ENCOUNTER — Other Ambulatory Visit: Payer: Self-pay | Admitting: Nurse Practitioner

## 2020-01-29 DIAGNOSIS — E782 Mixed hyperlipidemia: Secondary | ICD-10-CM

## 2020-02-03 DIAGNOSIS — J3089 Other allergic rhinitis: Secondary | ICD-10-CM | POA: Diagnosis not present

## 2020-02-03 DIAGNOSIS — J3081 Allergic rhinitis due to animal (cat) (dog) hair and dander: Secondary | ICD-10-CM | POA: Diagnosis not present

## 2020-02-03 DIAGNOSIS — J301 Allergic rhinitis due to pollen: Secondary | ICD-10-CM | POA: Diagnosis not present

## 2020-02-07 ENCOUNTER — Other Ambulatory Visit: Payer: Self-pay | Admitting: Nurse Practitioner

## 2020-02-07 DIAGNOSIS — I10 Essential (primary) hypertension: Secondary | ICD-10-CM

## 2020-02-09 DIAGNOSIS — Z01419 Encounter for gynecological examination (general) (routine) without abnormal findings: Secondary | ICD-10-CM | POA: Diagnosis not present

## 2020-02-09 DIAGNOSIS — Z6830 Body mass index (BMI) 30.0-30.9, adult: Secondary | ICD-10-CM | POA: Diagnosis not present

## 2020-02-17 DIAGNOSIS — J301 Allergic rhinitis due to pollen: Secondary | ICD-10-CM | POA: Diagnosis not present

## 2020-02-17 DIAGNOSIS — J3089 Other allergic rhinitis: Secondary | ICD-10-CM | POA: Diagnosis not present

## 2020-03-01 DIAGNOSIS — J3081 Allergic rhinitis due to animal (cat) (dog) hair and dander: Secondary | ICD-10-CM | POA: Diagnosis not present

## 2020-03-01 DIAGNOSIS — J301 Allergic rhinitis due to pollen: Secondary | ICD-10-CM | POA: Diagnosis not present

## 2020-03-01 DIAGNOSIS — J3089 Other allergic rhinitis: Secondary | ICD-10-CM | POA: Diagnosis not present

## 2020-03-16 DIAGNOSIS — J301 Allergic rhinitis due to pollen: Secondary | ICD-10-CM | POA: Diagnosis not present

## 2020-03-16 DIAGNOSIS — J3089 Other allergic rhinitis: Secondary | ICD-10-CM | POA: Diagnosis not present

## 2020-03-28 DIAGNOSIS — J3089 Other allergic rhinitis: Secondary | ICD-10-CM | POA: Diagnosis not present

## 2020-03-28 DIAGNOSIS — J301 Allergic rhinitis due to pollen: Secondary | ICD-10-CM | POA: Diagnosis not present

## 2020-03-28 DIAGNOSIS — J3081 Allergic rhinitis due to animal (cat) (dog) hair and dander: Secondary | ICD-10-CM | POA: Diagnosis not present

## 2020-04-10 ENCOUNTER — Other Ambulatory Visit: Payer: Self-pay

## 2020-04-10 ENCOUNTER — Encounter: Payer: Self-pay | Admitting: Nurse Practitioner

## 2020-04-10 ENCOUNTER — Ambulatory Visit (INDEPENDENT_AMBULATORY_CARE_PROVIDER_SITE_OTHER): Payer: Medicare HMO | Admitting: Nurse Practitioner

## 2020-04-10 VITALS — BP 134/80 | HR 83 | Temp 98.4°F | Ht <= 58 in | Wt 147.8 lb

## 2020-04-10 DIAGNOSIS — Z0000 Encounter for general adult medical examination without abnormal findings: Secondary | ICD-10-CM | POA: Diagnosis not present

## 2020-04-10 DIAGNOSIS — E559 Vitamin D deficiency, unspecified: Secondary | ICD-10-CM

## 2020-04-10 DIAGNOSIS — Z79899 Other long term (current) drug therapy: Secondary | ICD-10-CM | POA: Diagnosis not present

## 2020-04-10 DIAGNOSIS — E782 Mixed hyperlipidemia: Secondary | ICD-10-CM | POA: Diagnosis not present

## 2020-04-10 DIAGNOSIS — M810 Age-related osteoporosis without current pathological fracture: Secondary | ICD-10-CM

## 2020-04-10 DIAGNOSIS — I10 Essential (primary) hypertension: Secondary | ICD-10-CM | POA: Diagnosis not present

## 2020-04-10 DIAGNOSIS — I119 Hypertensive heart disease without heart failure: Secondary | ICD-10-CM | POA: Insufficient documentation

## 2020-04-10 NOTE — Progress Notes (Signed)
I,Yamilka Roman [employer] as a Education administrator for [employer], FNP.,have documented all relevant documentation on the behalf of [name], FNP,as directed by  [name], FNP while in the presence of [name], [place]. This visit occurred during the SARS-CoV-2 public health emergency.  Safety protocols were in place, including screening questions prior to the visit, additional usage of staff PPE, and extensive cleaning of exam room while observing appropriate contact time as indicated for disinfecting solutions.  Subjective:     Patient ID: Linda Moreno , female    DOB: 1945-12-25 , 74 y.o.   MRN: [id number]   Chief Complaint  Patient presents with  . Annual Exam    HPI  Here for HM   Wt Readings from Last 3 Encounters: 04/10/20 : 147 lb 12.8 oz (67 kg) 11/02/19 : 151 lb (68.5 kg) 07/07/19 : 158 lb (71.7 kg)    Past Medical History:  Diagnosis Date  . Asthma      Family History  Problem Relation Age of Onset  . Diabetes Father      Current Outpatient Medications:  .  albuterol (PROVENTIL HFA;VENTOLIN HFA) 108 (90 Base) MCG/ACT inhaler, Inhale 2 puffs into the lungs every 4 (four) hours as needed for wheezing or shortness of breath., Disp: , Rfl:  .  amLODipine (NORVASC) 10 MG tablet, TAKE 1 TABLET EVERY DAY, Disp: 90 tablet, Rfl: 0 .  atorvastatin (LIPITOR) 10 MG tablet, TAKE 1 TABLET BY MOUTH EVERY DAY, Disp: 90 tablet, Rfl: 1 .  cetirizine (ZYRTEC ALLERGY) 10 MG tablet, Take 1 tablet (10 mg total) by mouth daily., Disp: 1 tablet, Rfl: 0 .  fluticasone furoate-vilanterol (BREO ELLIPTA) 100-25 MCG/INH AEPB, Inhale 1 puff into the lungs daily., Disp: , Rfl:  .  ibandronate (BONIVA) 150 MG tablet, TAKE 1 TABLET ORALLY ONCE MONTHLY WITH 8-10 OZ WATER. SIT UPRIGHT AND NO FOOD/DRINK FOR 1 HOUR., Disp: , Rfl: 11 .  multivitamin-iron-minerals-folic acid (CENTRUM) chewable tablet, Chew 1 tablet by mouth daily., Disp: , Rfl:  .  calcium citrate (CALCITRATE - DOSED IN MG ELEMENTAL  CALCIUM) 950 MG tablet, Take 200 mg of elemental calcium by mouth daily., Disp: , Rfl:  .  montelukast (SINGULAIR) 10 MG tablet, Take 1 tablet (10 mg total) by mouth daily., Disp: 1 tablet, Rfl: 0   No Known Allergies    The patient states she uses post menopausal status for birth control. Negative for Dysmenorrhea and Negative for Menorrhagia. Negative for: breast discharge, breast lump(s), breast pain and breast self exam. Associated symptoms include abnormal vaginal bleeding. Pertinent negatives include abnormal bleeding (hematology), anxiety, decreased libido, depression, difficulty falling sleep, dyspareunia, history of infertility, nocturia, sexual dysfunction, sleep disturbances, urinary incontinence, urinary urgency, vaginal discharge and vaginal itching. Diet regula; she is eating a low salt diet. The patient states her exercise level is minimal however she is caring for her one year old grandson.    The patient's tobacco use is:  Social History   Tobacco Use  Smoking Status Never Smoker  Smokeless Tobacco Never Used   She has been exposed to passive smoke. The patient's alcohol use is:  Social History   Substance and Sexual Activity  Alcohol Use No    Review of Systems  Constitutional: Negative.   HENT: Negative.   Eyes: Negative.   Respiratory: Negative.   Cardiovascular: Negative.   Gastrointestinal: Negative.   Endocrine: Negative.   Genitourinary: Negative.   Musculoskeletal: Negative.   Skin: Negative.   Allergic/Immunologic: Negative.  Neurological: Negative.   Hematological: Negative.   Psychiatric/Behavioral: Negative.      Today's Vitals   04/10/20 1136  BP: 134/80  Pulse: 83  Temp: 98.4 F (36.9 C)  Weight: 147 lb 12.8 oz (67 kg)  Height: 4' 9"  (1.448 m)   Body mass index is 31.98 kg/m.   Objective:  Physical Exam Constitutional:      General: She is not in acute distress.    Appearance: Normal appearance. She is well-developed. She is  obese.  HENT:     Head: Normocephalic and atraumatic.     Right Ear: Hearing, tympanic membrane, ear canal and external ear normal. There is no impacted cerumen.     Left Ear: Hearing, tympanic membrane, ear canal and external ear normal. There is no impacted cerumen.     Nose:     Comments: Deferred - masked    Mouth/Throat:     Comments: Deferred - masked Eyes:     General: Lids are normal.     Extraocular Movements: Extraocular movements intact.     Conjunctiva/sclera: Conjunctivae normal.     Pupils: Pupils are equal, round, and reactive to light.     Funduscopic exam:    Right eye: No papilledema.        Left eye: No papilledema.  Neck:     Thyroid: No thyroid mass.     Vascular: No carotid bruit.  Cardiovascular:     Rate and Rhythm: Normal rate and regular rhythm.     Pulses: Normal pulses.     Heart sounds: Normal heart sounds. No murmur heard.   Pulmonary:     Effort: Pulmonary effort is normal.     Breath sounds: Normal breath sounds.  Chest:     Chest wall: No mass.     Breasts: Tanner Score is 5.        Right: Normal. No mass or tenderness.        Left: Normal. No mass or tenderness.  Abdominal:     General: Abdomen is flat. Bowel sounds are normal. There is no distension.     Palpations: Abdomen is soft.     Tenderness: There is no abdominal tenderness.  Genitourinary:    Rectum: Guaiac result negative.  Musculoskeletal:        General: No swelling. Normal range of motion.     Cervical back: Full passive range of motion without pain, normal range of motion and neck supple.     Right lower leg: No edema.     Left lower leg: No edema.  Lymphadenopathy:     Upper Body:     Right upper body: No supraclavicular, axillary or pectoral adenopathy.     Left upper body: No supraclavicular, axillary or pectoral adenopathy.  Skin:    General: Skin is warm and dry.     Capillary Refill: Capillary refill takes less than 2 seconds.  Neurological:     General: No  focal deficit present.     Mental Status: She is alert and oriented to person, place, and time.     Cranial Nerves: No cranial nerve deficit.     Sensory: No sensory deficit.  Psychiatric:        Mood and Affect: Mood normal.        Behavior: Behavior normal.        Thought Content: Thought content normal.        Judgment: Judgment normal.         Assessment And  Plan:     1. Encounter for general adult medical examination w/o abnormal findings . Behavior modifications discussed and diet history reviewed.   . Pt will continue to exercise regularly and modify diet with low GI, plant based foods and decrease intake of processed foods.  . Recommend intake of daily multivitamin, Vitamin D, and calcium.  . Recommend mammogram (up to date), will request records from Dr. Mikey Kirschner for her bone density for preventive screenings, as well as recommend immunizations that include influenza (up to date) TDAP. She is planning to go for her Covid booster today will defer pneumonia for now.   2. Essential hypertension . B/P is fairly controlled.  . CMP ordered to check renal function.  . The importance of regular exercise and dietary modification was stressed to the patient.  . Stressed importance of losing ten percent of her body weight to help with B/P control.  . The weight loss would help with decreasing cardiac and cancer risk as well.  . EKG done with SR with occasional ectopic ventricular beat - POCT Urinalysis Dipstick (81002) - POCT UA - Microalbumin - EKG 12-Lead - CMP14+EGFR  3. Mixed hyperlipidemia  Chronic, controlled  No current medications - Lipid panel  4. Vitamin D deficiency  Will check vitamin D level and supplement as needed.     Also encouraged to spend 15 minutes in the sun daily.   5. Other long term (current) drug therapy - CBC  6. Age-related osteoporosis without current pathological fracture  She reports her most recent bone density was one year ago  Will  get her records from Dr. Mikey Kirschner - VITAMIN D 25 Hydroxy (Vit-D Deficiency, Fractures)   Patient was given opportunity to ask questions. Patient verbalized understanding of the plan and was able to repeat key elements of the plan. All questions were answered to their satisfaction.   Teola Bradley, FNP, have reviewed all documentation for this visit. The documentation on 04/10/20 for the exam, diagnosis, procedures, and orders are all accurate and complete.   THE PATIENT IS ENCOURAGED TO PRACTICE SOCIAL DISTANCING DUE TO THE COVID-19 PANDEMIC.

## 2020-04-10 NOTE — Patient Instructions (Signed)
Health Maintenance, Female Adopting a healthy lifestyle and getting preventive care are important in promoting health and wellness. Ask your health care provider about:  The right schedule for you to have regular tests and exams.  Things you can do on your own to prevent diseases and keep yourself healthy. What should I know about diet, weight, and exercise? Eat a healthy diet   Eat a diet that includes plenty of vegetables, fruits, low-fat dairy products, and lean protein.  Do not eat a lot of foods that are high in solid fats, added sugars, or sodium. Maintain a healthy weight Body mass index (BMI) is used to identify weight problems. It estimates body fat based on height and weight. Your health care provider can help determine your BMI and help you achieve or maintain a healthy weight. Get regular exercise Get regular exercise. This is one of the most important things you can do for your health. Most adults should:  Exercise for at least 150 minutes each week. The exercise should increase your heart rate and make you sweat (moderate-intensity exercise).  Do strengthening exercises at least twice a week. This is in addition to the moderate-intensity exercise.  Spend less time sitting. Even light physical activity can be beneficial. Watch cholesterol and blood lipids Have your blood tested for lipids and cholesterol at 75 years of age, then have this test every 5 years. Have your cholesterol levels checked more often if:  Your lipid or cholesterol levels are high.  You are older than 75 years of age.  You are at high risk for heart disease. What should I know about cancer screening? Depending on your health history and family history, you may need to have cancer screening at various ages. This may include screening for:  Breast cancer.  Cervical cancer.  Colorectal cancer.  Skin cancer.  Lung cancer. What should I know about heart disease, diabetes, and high blood  pressure? Blood pressure and heart disease  High blood pressure causes heart disease and increases the risk of stroke. This is more likely to develop in people who have high blood pressure readings, are of African descent, or are overweight.  Have your blood pressure checked: ? Every 3-5 years if you are 18-39 years of age. ? Every year if you are 40 years old or older. Diabetes Have regular diabetes screenings. This checks your fasting blood sugar level. Have the screening done:  Once every three years after age 40 if you are at a normal weight and have a low risk for diabetes.  More often and at a younger age if you are overweight or have a high risk for diabetes. What should I know about preventing infection? Hepatitis B If you have a higher risk for hepatitis B, you should be screened for this virus. Talk with your health care provider to find out if you are at risk for hepatitis B infection. Hepatitis C Testing is recommended for:  Everyone born from 1945 through 1965.  Anyone with known risk factors for hepatitis C. Sexually transmitted infections (STIs)  Get screened for STIs, including gonorrhea and chlamydia, if: ? You are sexually active and are younger than 75 years of age. ? You are older than 75 years of age and your health care provider tells you that you are at risk for this type of infection. ? Your sexual activity has changed since you were last screened, and you are at increased risk for chlamydia or gonorrhea. Ask your health care provider if   you are at risk.  Ask your health care provider about whether you are at high risk for HIV. Your health care provider may recommend a prescription medicine to help prevent HIV infection. If you choose to take medicine to prevent HIV, you should first get tested for HIV. You should then be tested every 3 months for as long as you are taking the medicine. Pregnancy  If you are about to stop having your period (premenopausal) and  you may become pregnant, seek counseling before you get pregnant.  Take 400 to 800 micrograms (mcg) of folic acid every day if you become pregnant.  Ask for birth control (contraception) if you want to prevent pregnancy. Osteoporosis and menopause Osteoporosis is a disease in which the bones lose minerals and strength with aging. This can result in bone fractures. If you are 65 years old or older, or if you are at risk for osteoporosis and fractures, ask your health care provider if you should:  Be screened for bone loss.  Take a calcium or vitamin D supplement to lower your risk of fractures.  Be given hormone replacement therapy (HRT) to treat symptoms of menopause. Follow these instructions at home: Lifestyle  Do not use any products that contain nicotine or tobacco, such as cigarettes, e-cigarettes, and chewing tobacco. If you need help quitting, ask your health care provider.  Do not use street drugs.  Do not share needles.  Ask your health care provider for help if you need support or information about quitting drugs. Alcohol use  Do not drink alcohol if: ? Your health care provider tells you not to drink. ? You are pregnant, may be pregnant, or are planning to become pregnant.  If you drink alcohol: ? Limit how much you use to 0-1 drink a day. ? Limit intake if you are breastfeeding.  Be aware of how much alcohol is in your drink. In the [place] one drink equals one 12 oz bottle of beer (355 mL), one 5 oz glass of wine (148 mL), or one 1 oz glass of hard liquor (44 mL). General instructions  Schedule regular health, dental, and eye exams.  Stay current with your vaccines.  Tell your health care provider if: ? You often feel depressed. ? You have ever been abused or do not feel safe at home. Summary  Adopting a healthy lifestyle and getting preventive care are important in promoting health and wellness.  Follow your health care provider's instructions about healthy  diet, exercising, and getting tested or screened for diseases.  Follow your health care provider's instructions on monitoring your cholesterol and blood pressure. This information is not intended to replace advice given to you by your health care provider. Make sure you discuss any questions you have with your health care provider. Document Revised: 05/05/2018 Document Reviewed: 05/05/2018 Elsevier Patient Education  2020 Elsevier Inc.  

## 2020-04-12 ENCOUNTER — Other Ambulatory Visit: Payer: Self-pay | Admitting: Nurse Practitioner

## 2020-04-12 DIAGNOSIS — I10 Essential (primary) hypertension: Secondary | ICD-10-CM

## 2020-04-17 ENCOUNTER — Other Ambulatory Visit: Payer: Self-pay

## 2020-04-17 ENCOUNTER — Other Ambulatory Visit: Payer: Medicare HMO

## 2020-04-17 DIAGNOSIS — Z79899 Other long term (current) drug therapy: Secondary | ICD-10-CM | POA: Diagnosis not present

## 2020-04-17 DIAGNOSIS — I10 Essential (primary) hypertension: Secondary | ICD-10-CM | POA: Diagnosis not present

## 2020-04-17 DIAGNOSIS — M810 Age-related osteoporosis without current pathological fracture: Secondary | ICD-10-CM | POA: Diagnosis not present

## 2020-04-17 DIAGNOSIS — E782 Mixed hyperlipidemia: Secondary | ICD-10-CM | POA: Diagnosis not present

## 2020-04-18 LAB — CBC
Hematocrit: 38.7 % (ref 34.0–46.6)
Hemoglobin: 13.1 g/dL (ref 11.1–15.9)
MCH: 29.6 pg (ref 26.6–33.0)
MCHC: 33.9 g/dL (ref 31.5–35.7)
MCV: 87 fL (ref 79–97)
Platelets: 215 10*3/uL (ref 150–450)
RBC: 4.43 x10E6/uL (ref 3.77–5.28)
RDW: 12.2 % (ref 11.7–15.4)
WBC: 5.2 10*3/uL (ref 3.4–10.8)

## 2020-04-18 LAB — CMP14+EGFR
ALT: 14 IU/L (ref 0–32)
AST: 21 IU/L (ref 0–40)
Albumin/Globulin Ratio: 1.3 (ref 1.2–2.2)
Albumin: 4.3 g/dL (ref 3.7–4.7)
Alkaline Phosphatase: 98 IU/L (ref 44–121)
BUN/Creatinine Ratio: 19 (ref 12–28)
BUN: 14 mg/dL (ref 8–27)
Bilirubin Total: 0.3 mg/dL (ref 0.0–1.2)
CO2: 26 mmol/L (ref 20–29)
Calcium: 9.1 mg/dL (ref 8.7–10.3)
Chloride: 104 mmol/L (ref 96–106)
Creatinine, Ser: 0.72 mg/dL (ref 0.57–1.00)
GFR calc Af Amer: 95 mL/min/{1.73_m2} (ref >59)
GFR calc non Af Amer: 82 mL/min/{1.73_m2} (ref >59)
Globulin, Total: 3.2 g/dL (ref 1.5–4.5)
Glucose: 91 mg/dL (ref 65–99)
Potassium: 4.1 mmol/L (ref 3.5–5.2)
Sodium: 142 mmol/L (ref 134–144)
Total Protein: 7.5 g/dL (ref 6.0–8.5)

## 2020-04-18 LAB — LIPID PANEL
Chol/HDL Ratio: 2.1 ratio (ref 0.0–4.4)
Cholesterol, Total: 169 mg/dL (ref 100–199)
HDL: 81 mg/dL (ref >39)
LDL Chol Calc (NIH): 74 mg/dL (ref 0–99)
Triglycerides: 76 mg/dL (ref 0–149)
VLDL Cholesterol Cal: 14 mg/dL (ref 5–40)

## 2020-04-18 LAB — VITAMIN D 25 HYDROXY (VIT D DEFICIENCY, FRACTURES): Vit D, 25-Hydroxy: 53 ng/mL (ref 30.0–100.0)

## 2020-04-25 DIAGNOSIS — J301 Allergic rhinitis due to pollen: Secondary | ICD-10-CM | POA: Diagnosis not present

## 2020-04-25 DIAGNOSIS — J3089 Other allergic rhinitis: Secondary | ICD-10-CM | POA: Diagnosis not present

## 2020-05-09 ENCOUNTER — Other Ambulatory Visit: Payer: Self-pay

## 2020-05-09 DIAGNOSIS — E782 Mixed hyperlipidemia: Secondary | ICD-10-CM

## 2020-05-09 MED ORDER — IBANDRONATE SODIUM 150 MG PO TABS: ORAL_TABLET | ORAL | 0 refills | Status: DC

## 2020-05-09 MED ORDER — MONTELUKAST SODIUM 10 MG PO TABS: 10.0000 mg | ORAL_TABLET | Freq: Every day | ORAL | 0 refills | Status: DC

## 2020-05-09 MED ORDER — ATORVASTATIN CALCIUM 10 MG PO TABS: 10.0000 mg | ORAL_TABLET | Freq: Every day | ORAL | 1 refills | Status: DC

## 2020-05-10 DIAGNOSIS — J3089 Other allergic rhinitis: Secondary | ICD-10-CM | POA: Diagnosis not present

## 2020-05-10 DIAGNOSIS — J301 Allergic rhinitis due to pollen: Secondary | ICD-10-CM | POA: Diagnosis not present

## 2020-05-21 ENCOUNTER — Other Ambulatory Visit: Payer: Self-pay | Admitting: Nurse Practitioner

## 2020-05-21 DIAGNOSIS — Z1231 Encounter for screening mammogram for malignant neoplasm of breast: Secondary | ICD-10-CM

## 2020-05-30 DIAGNOSIS — J301 Allergic rhinitis due to pollen: Secondary | ICD-10-CM | POA: Diagnosis not present

## 2020-05-30 DIAGNOSIS — J3089 Other allergic rhinitis: Secondary | ICD-10-CM | POA: Diagnosis not present

## 2020-06-20 DIAGNOSIS — Z01812 Encounter for preprocedural laboratory examination: Secondary | ICD-10-CM | POA: Diagnosis not present

## 2020-06-25 DIAGNOSIS — J301 Allergic rhinitis due to pollen: Secondary | ICD-10-CM | POA: Diagnosis not present

## 2020-06-25 DIAGNOSIS — K5730 Diverticulosis of large intestine without perforation or abscess without bleeding: Secondary | ICD-10-CM | POA: Diagnosis not present

## 2020-06-25 DIAGNOSIS — K635 Polyp of colon: Secondary | ICD-10-CM | POA: Diagnosis not present

## 2020-06-25 DIAGNOSIS — D124 Benign neoplasm of descending colon: Secondary | ICD-10-CM | POA: Diagnosis not present

## 2020-06-25 DIAGNOSIS — J3089 Other allergic rhinitis: Secondary | ICD-10-CM | POA: Diagnosis not present

## 2020-06-25 DIAGNOSIS — Z86010 Personal history of colonic polyps: Secondary | ICD-10-CM | POA: Diagnosis not present

## 2020-06-25 DIAGNOSIS — D122 Benign neoplasm of ascending colon: Secondary | ICD-10-CM | POA: Diagnosis not present

## 2020-06-25 DIAGNOSIS — K648 Other hemorrhoids: Secondary | ICD-10-CM | POA: Diagnosis not present

## 2020-06-25 LAB — HM COLONOSCOPY

## 2020-06-27 ENCOUNTER — Other Ambulatory Visit: Payer: Self-pay

## 2020-06-27 ENCOUNTER — Ambulatory Visit
Admission: RE | Admit: 2020-06-27 | Discharge: 2020-06-27 | Disposition: A | Discharge status: Home / Self Care | Payer: Medicare HMO | Source: Ambulatory Visit | Attending: Nurse Practitioner | Admitting: Nurse Practitioner

## 2020-06-27 DIAGNOSIS — D124 Benign neoplasm of descending colon: Secondary | ICD-10-CM | POA: Diagnosis not present

## 2020-06-27 DIAGNOSIS — Z1231 Encounter for screening mammogram for malignant neoplasm of breast: Secondary | ICD-10-CM

## 2020-06-27 DIAGNOSIS — D122 Benign neoplasm of ascending colon: Secondary | ICD-10-CM | POA: Diagnosis not present

## 2020-06-27 DIAGNOSIS — K635 Polyp of colon: Secondary | ICD-10-CM | POA: Diagnosis not present

## 2020-07-02 ENCOUNTER — Encounter: Payer: Self-pay | Admitting: Nurse Practitioner

## 2020-07-03 DIAGNOSIS — J3081 Allergic rhinitis due to animal (cat) (dog) hair and dander: Secondary | ICD-10-CM | POA: Diagnosis not present

## 2020-07-03 DIAGNOSIS — J301 Allergic rhinitis due to pollen: Secondary | ICD-10-CM | POA: Diagnosis not present

## 2020-07-03 DIAGNOSIS — J3089 Other allergic rhinitis: Secondary | ICD-10-CM | POA: Diagnosis not present

## 2020-07-11 ENCOUNTER — Ambulatory Visit: Payer: Medicare HMO | Admitting: Nurse Practitioner

## 2020-07-13 DIAGNOSIS — J301 Allergic rhinitis due to pollen: Secondary | ICD-10-CM | POA: Diagnosis not present

## 2020-07-13 DIAGNOSIS — J3081 Allergic rhinitis due to animal (cat) (dog) hair and dander: Secondary | ICD-10-CM | POA: Diagnosis not present

## 2020-07-13 DIAGNOSIS — J3089 Other allergic rhinitis: Secondary | ICD-10-CM | POA: Diagnosis not present

## 2020-07-25 DIAGNOSIS — J3081 Allergic rhinitis due to animal (cat) (dog) hair and dander: Secondary | ICD-10-CM | POA: Diagnosis not present

## 2020-07-25 DIAGNOSIS — J3089 Other allergic rhinitis: Secondary | ICD-10-CM | POA: Diagnosis not present

## 2020-07-25 DIAGNOSIS — J301 Allergic rhinitis due to pollen: Secondary | ICD-10-CM | POA: Diagnosis not present

## 2020-08-16 DIAGNOSIS — J301 Allergic rhinitis due to pollen: Secondary | ICD-10-CM | POA: Diagnosis not present

## 2020-08-16 DIAGNOSIS — J3081 Allergic rhinitis due to animal (cat) (dog) hair and dander: Secondary | ICD-10-CM | POA: Diagnosis not present

## 2020-08-16 DIAGNOSIS — J3089 Other allergic rhinitis: Secondary | ICD-10-CM | POA: Diagnosis not present

## 2020-08-21 ENCOUNTER — Telehealth: Payer: Self-pay | Admitting: Nurse Practitioner

## 2020-08-21 NOTE — Telephone Encounter (Signed)
Left message for patient to call back and schedule Medicare Annual Wellness Visit (AWV) either virtually or in office.   Last AWV 07/07/19  please schedule at anytime with [facility]    This should be a 45 minute visit.

## 2020-08-29 IMAGING — MG DIGITAL SCREENING BILAT W/ TOMO W/ CAD
8 series · 9 of 24 positions shown · non-contrast
Comparison: Previous exam(s).

CLINICAL DATA: Screening.

EXAM:
DIGITAL SCREENING BILATERAL MAMMOGRAM WITH TOMO AND CAD

[R MLO synth-2D]
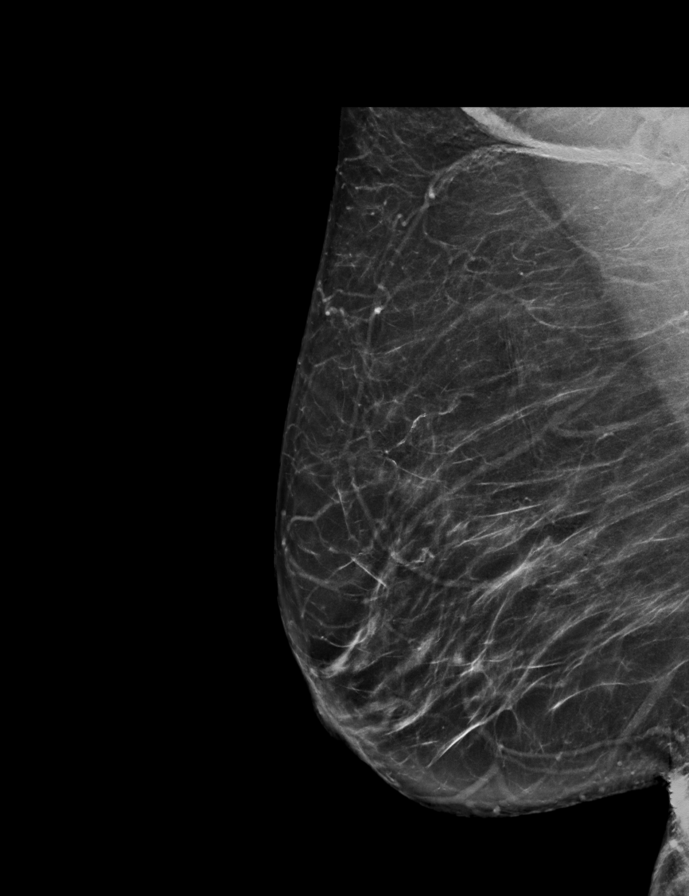

[L CC synth-2D]
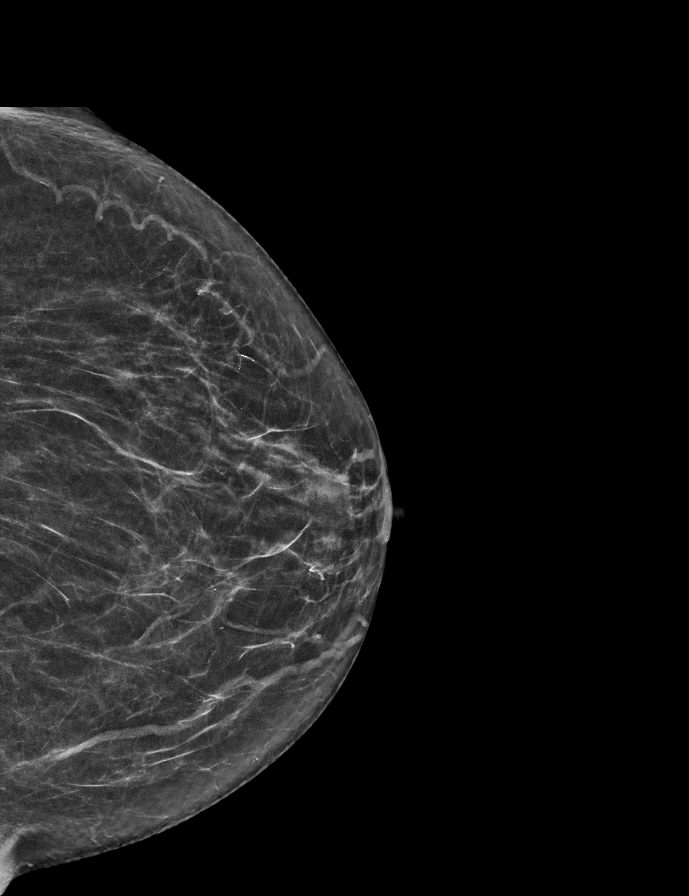

[R CC synth-2D]
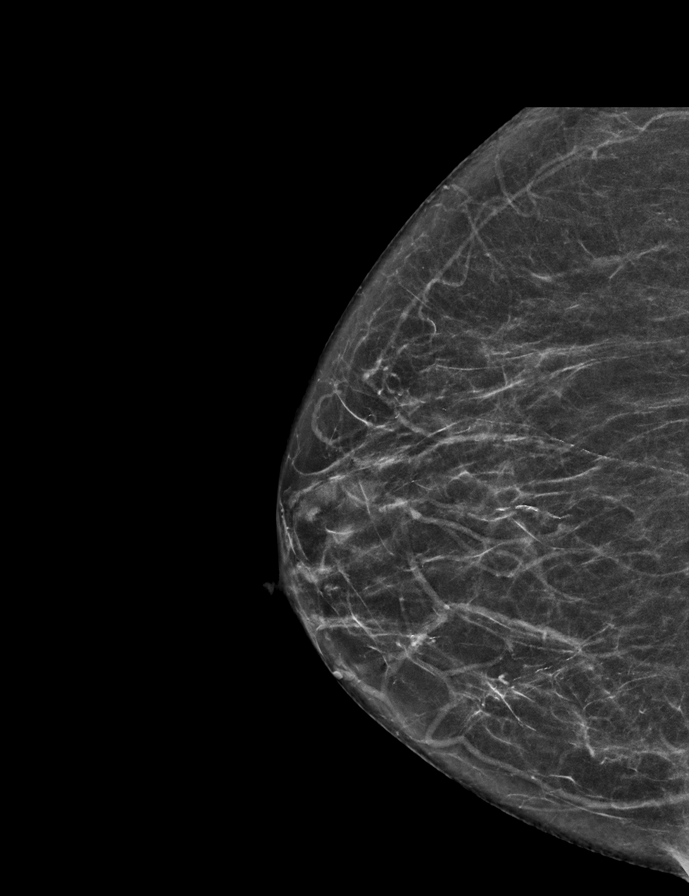

[L MLO synth-2D]
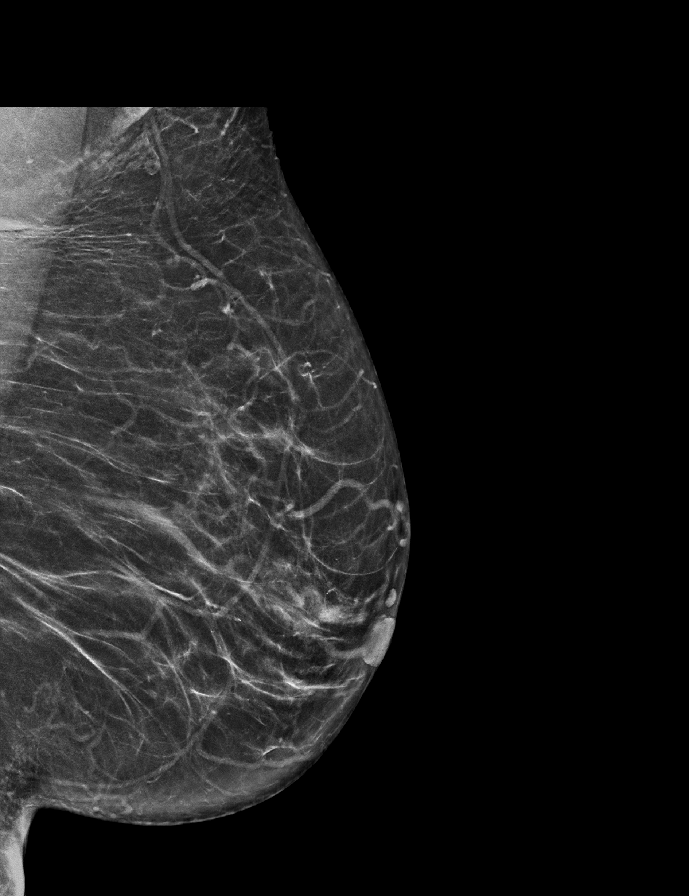

[L MLO tomo · 2 of 61 frames shown]
[frame 20/61]
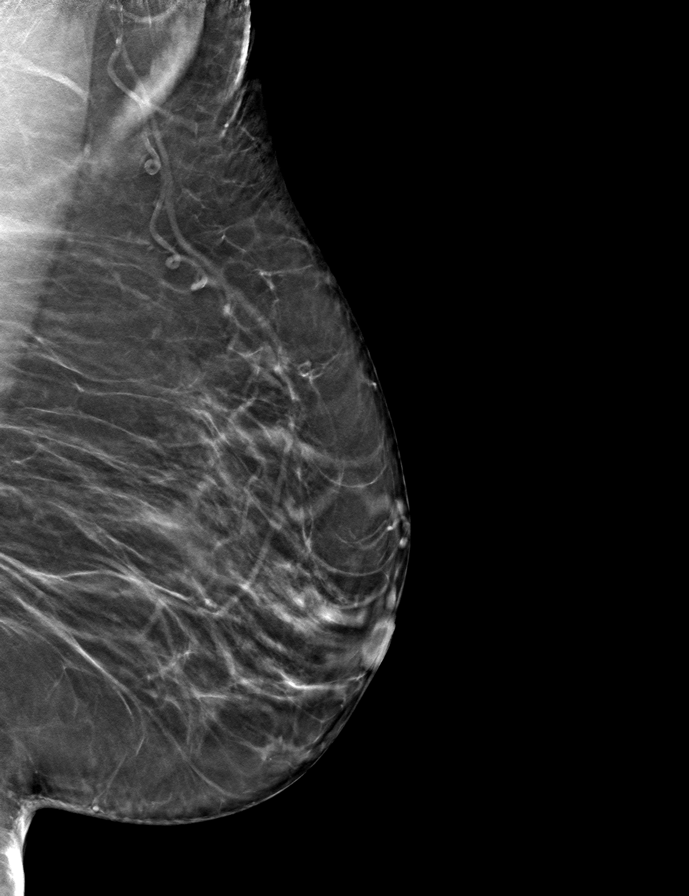
[frame 31/61]
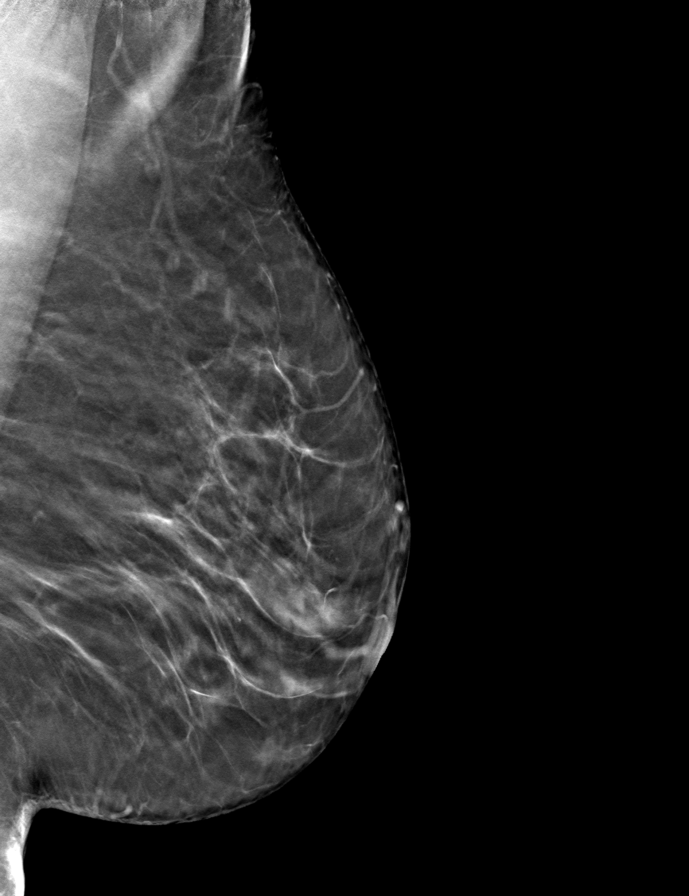

[R CC tomo · tomo slice 29/57.0]
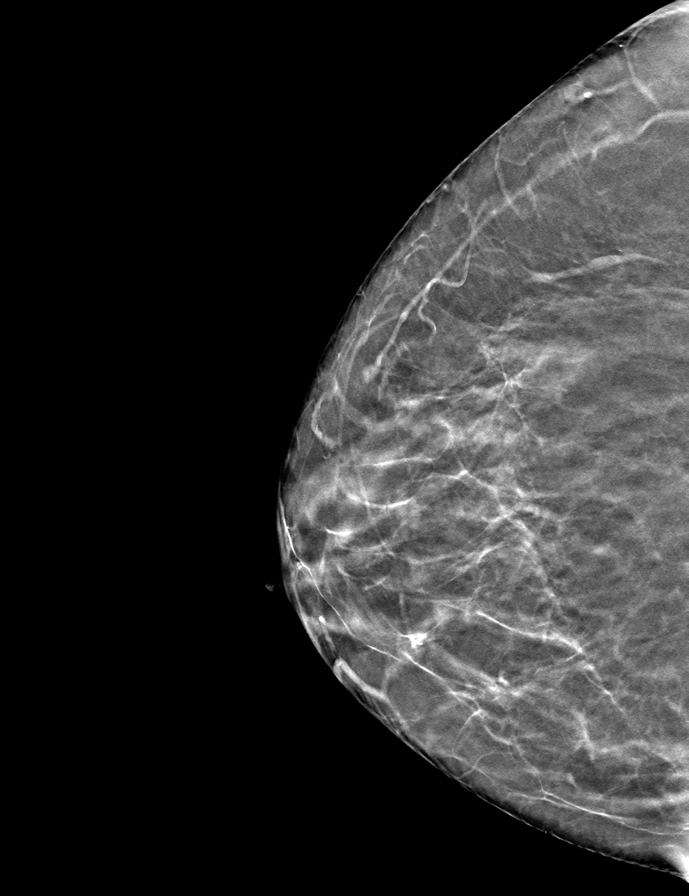

[L CC tomo · tomo slice 28/55.0]
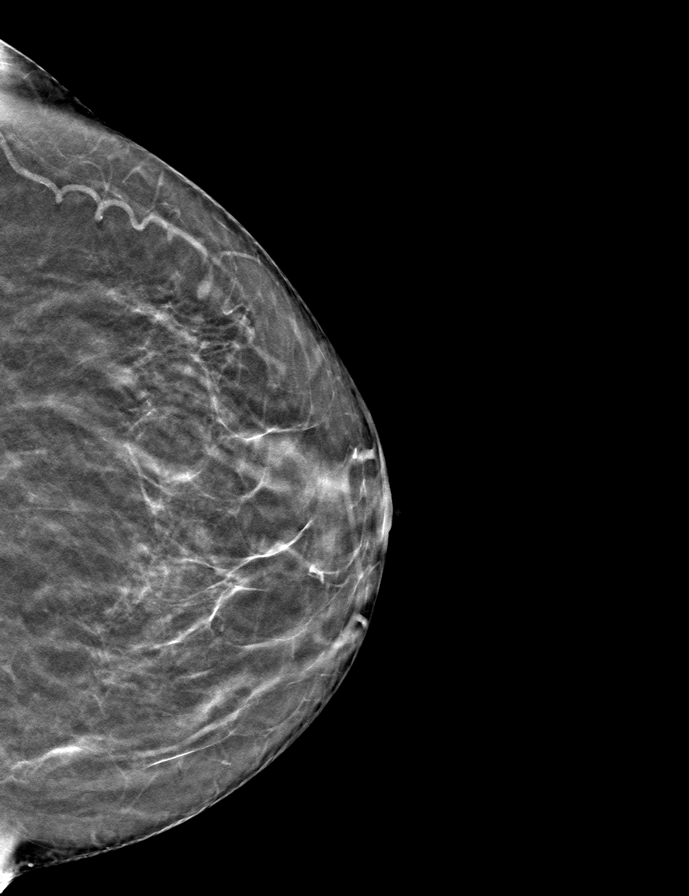

[R MLO tomo · tomo slice 35/68.0]
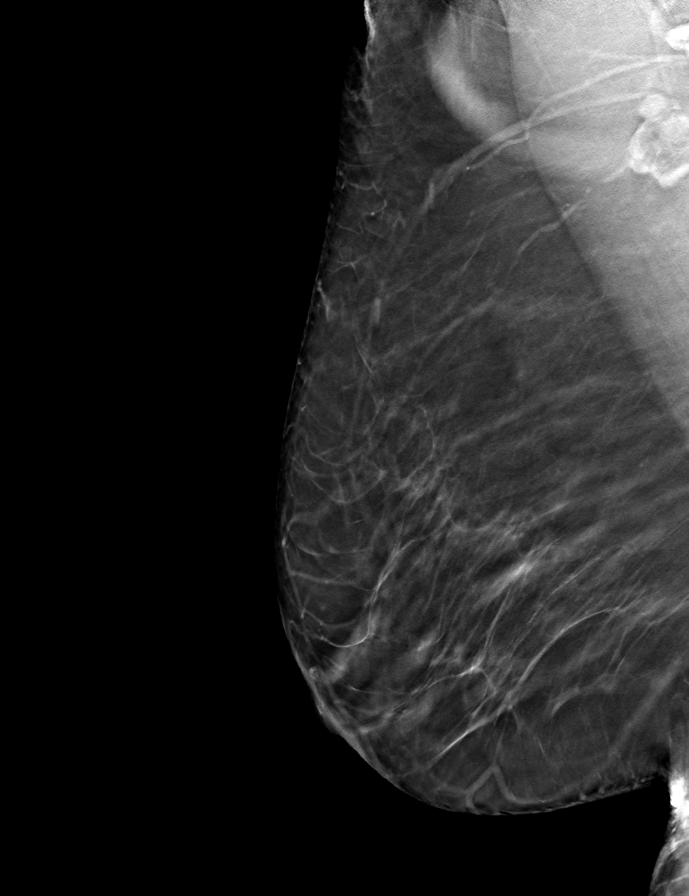

[9 of 24 positions shown; findings below may reference images not displayed]

ACR Breast Density Category b: There are scattered areas of
fibroglandular density.
FINDINGS: There are no findings suspicious for malignancy. Images were
processed with CAD.
IMPRESSION: No mammographic evidence of malignancy. A result letter of this
screening mammogram will be mailed directly to the patient.

RECOMMENDATION:
Screening mammogram in one year. (Code:[id number])

BI-RADS CATEGORY  1: Negative.

## 2020-08-31 ENCOUNTER — Other Ambulatory Visit: Payer: Self-pay | Admitting: Nurse Practitioner

## 2020-08-31 DIAGNOSIS — I10 Essential (primary) hypertension: Secondary | ICD-10-CM

## 2020-08-31 DIAGNOSIS — J3081 Allergic rhinitis due to animal (cat) (dog) hair and dander: Secondary | ICD-10-CM | POA: Diagnosis not present

## 2020-08-31 DIAGNOSIS — J301 Allergic rhinitis due to pollen: Secondary | ICD-10-CM | POA: Diagnosis not present

## 2020-08-31 DIAGNOSIS — J3089 Other allergic rhinitis: Secondary | ICD-10-CM | POA: Diagnosis not present

## 2020-09-13 DIAGNOSIS — J301 Allergic rhinitis due to pollen: Secondary | ICD-10-CM | POA: Diagnosis not present

## 2020-09-13 DIAGNOSIS — J3089 Other allergic rhinitis: Secondary | ICD-10-CM | POA: Diagnosis not present

## 2020-09-13 DIAGNOSIS — J3081 Allergic rhinitis due to animal (cat) (dog) hair and dander: Secondary | ICD-10-CM | POA: Diagnosis not present

## 2020-09-20 ENCOUNTER — Ambulatory Visit: Payer: Self-pay

## 2020-09-25 DIAGNOSIS — J301 Allergic rhinitis due to pollen: Secondary | ICD-10-CM | POA: Diagnosis not present

## 2020-09-25 DIAGNOSIS — J3081 Allergic rhinitis due to animal (cat) (dog) hair and dander: Secondary | ICD-10-CM | POA: Diagnosis not present

## 2020-09-25 DIAGNOSIS — J3089 Other allergic rhinitis: Secondary | ICD-10-CM | POA: Diagnosis not present

## 2020-10-03 ENCOUNTER — Other Ambulatory Visit: Payer: Self-pay | Admitting: Nurse Practitioner

## 2020-10-03 DIAGNOSIS — E782 Mixed hyperlipidemia: Secondary | ICD-10-CM

## 2020-10-08 ENCOUNTER — Other Ambulatory Visit: Payer: Self-pay

## 2020-10-08 ENCOUNTER — Ambulatory Visit (INDEPENDENT_AMBULATORY_CARE_PROVIDER_SITE_OTHER): Payer: Medicare HMO | Admitting: Nurse Practitioner

## 2020-10-08 ENCOUNTER — Encounter: Payer: Self-pay | Admitting: Nurse Practitioner

## 2020-10-08 VITALS — BP 130/74 | HR 73 | Temp 98.4°F | Ht <= 58 in | Wt 150.6 lb

## 2020-10-08 DIAGNOSIS — E559 Vitamin D deficiency, unspecified: Secondary | ICD-10-CM

## 2020-10-08 DIAGNOSIS — M810 Age-related osteoporosis without current pathological fracture: Secondary | ICD-10-CM

## 2020-10-08 DIAGNOSIS — E782 Mixed hyperlipidemia: Secondary | ICD-10-CM

## 2020-10-08 DIAGNOSIS — J302 Other seasonal allergic rhinitis: Secondary | ICD-10-CM

## 2020-10-08 DIAGNOSIS — I10 Essential (primary) hypertension: Secondary | ICD-10-CM

## 2020-10-08 DIAGNOSIS — E7800 Pure hypercholesterolemia, unspecified: Secondary | ICD-10-CM

## 2020-10-08 MED ORDER — CETIRIZINE HCL 10 MG PO TABS: 10.0000 mg | ORAL_TABLET | Freq: Every day | ORAL | 1 refills | Status: AC

## 2020-10-08 MED ORDER — MONTELUKAST SODIUM 10 MG PO TABS: 10.0000 mg | ORAL_TABLET | Freq: Every day | ORAL | 1 refills | Status: DC

## 2020-10-08 NOTE — Patient Instructions (Signed)

## 2020-10-08 NOTE — Progress Notes (Signed)
I,Yamilka Roman [employer] as a Education administrator for [employer], FNP.,have documented all relevant documentation on the behalf of [name], FNP,as directed by  [name], FNP while in the presence of [name], [place]. This visit occurred during the SARS-CoV-2 public health emergency.  Safety protocols were in place, including screening questions prior to the visit, additional usage of staff PPE, and extensive cleaning of exam room while observing appropriate contact time as indicated for disinfecting solutions.  Subjective:     Patient ID: Linda Moreno , female    DOB: December 25, 1945 , 75 y.o.   MRN: [id number]   Chief Complaint  Patient presents with  . Hypertension    HPI  Patient presents today for a blood pressure f/u. She reports she is sticking to her diet and exercising by taking care of a 43 month old.    Wt Readings from Last 3 Encounters: 10/08/20 : 150 lb 9.6 oz (68.3 kg) 04/10/20 : 147 lb 12.8 oz (67 kg) 11/02/19 : 151 lb (68.5 kg)  Hypertension This is a chronic problem. The current episode started more than 1 year ago. The problem has been gradually improving since onset. The problem is controlled. Pertinent negatives include no anxiety, chest pain, headaches, palpitations or shortness of breath. There are no associated agents to hypertension. Risk factors for coronary artery disease include obesity and sedentary lifestyle. Past treatments include calcium channel blockers. The current treatment provides significant improvement. There are no compliance problems.  There is no history of angina or kidney disease. There is no history of chronic renal disease.     Past Medical History:  Diagnosis Date  . Asthma      Family History  Problem Relation Age of Onset  . Diabetes Father      Current Outpatient Medications:  .  albuterol (PROVENTIL HFA;VENTOLIN HFA) 108 (90 Base) MCG/ACT inhaler, Inhale 2 puffs into the lungs every 4 (four) hours as needed for wheezing or  shortness of breath., Disp: , Rfl:  .  amLODipine (NORVASC) 10 MG tablet, TAKE 1 TABLET EVERY DAY, Disp: 90 tablet, Rfl: 0 .  atorvastatin (LIPITOR) 10 MG tablet, TAKE 1 TABLET (10 MG TOTAL) BY MOUTH DAILY., Disp: 90 tablet, Rfl: 1 .  calcium citrate (CALCITRATE - DOSED IN MG ELEMENTAL CALCIUM) 950 MG tablet, Take 200 mg of elemental calcium by mouth daily., Disp: , Rfl:  .  fluticasone furoate-vilanterol (BREO ELLIPTA) 100-25 MCG/INH AEPB, Inhale 1 puff into the lungs daily., Disp: , Rfl:  .  ibandronate (BONIVA) 150 MG tablet, TAKE 1 TABLET ORALLY ONCE MONTHLY WITH 8-10 OZ WATER. SIT UPRIGHT AND NO FOOD/DRINK FOR 1 HOUR., Disp: 30 tablet, Rfl: 0 .  multivitamin-iron-minerals-folic acid (CENTRUM) chewable tablet, Chew 1 tablet by mouth daily., Disp: , Rfl:  .  cetirizine (ZYRTEC ALLERGY) 10 MG tablet, Take 1 tablet (10 mg total) by mouth daily., Disp: 90 tablet, Rfl: 1 .  montelukast (SINGULAIR) 10 MG tablet, Take 1 tablet (10 mg total) by mouth daily., Disp: 90 tablet, Rfl: 1   Allergies  Allergen Reactions  . Shellfish Allergy      Review of Systems  Constitutional: Negative.  Negative for fatigue.  Respiratory: Negative.  Negative for shortness of breath and wheezing.   Cardiovascular: Negative for chest pain, palpitations and leg swelling.  Neurological: Negative for dizziness and headaches.  Psychiatric/Behavioral: Negative.      Today's Vitals   10/08/20 1129  BP: 130/74  Pulse: 73  Temp: 98.4 F (36.9 C)  TempSrc:  Oral  Weight: 150 lb 9.6 oz (68.3 kg)  Height: 4' 9"  (1.448 m)  PainSc: 0-No pain   Body mass index is 32.59 kg/m.   Objective:  Physical Exam Vitals reviewed.  Constitutional:      General: She is not in acute distress.    Appearance: Normal appearance. She is obese.  Cardiovascular:     Rate and Rhythm: Normal rate and regular rhythm.     Pulses: Normal pulses.     Heart sounds: Normal heart sounds. No murmur heard.   Pulmonary:     Effort:  Pulmonary effort is normal. No respiratory distress.     Breath sounds: Normal breath sounds. No wheezing.  Skin:    Capillary Refill: Capillary refill takes less than 2 seconds.  Neurological:     General: No focal deficit present.     Mental Status: She is alert and oriented to person, place, and time.     Cranial Nerves: No cranial nerve deficit.  Psychiatric:        Mood and Affect: Mood normal.        Behavior: Behavior normal.        Thought Content: Thought content normal.        Judgment: Judgment normal.         Assessment And Plan:     1. Essential hypertension . B/P is better controlled.  . CMP ordered to check renal function.  . The importance of regular exercise and dietary modification was stressed to the patient.  . Stressed importance of losing ten percent of her body weight to help with B/P control.  - CMP14+EGFR  2. Seasonal allergies  Encouraged to take over the counter antihistamine  3. Vitamin D deficiency  Will check vitamin D level and supplement as needed.     Also encouraged to spend 15 minutes in the sun daily.  - CMP14+EGFR - VITAMIN D 25 Hydroxy (Vit-D Deficiency, Fractures)  4. Mixed hyperlipidemia  Chronic, controlled  Continue with current medications, tolerating medications well - Lipid panel  5. Age-related osteoporosis without current pathological fracture   Requesting records from [facility] for her Bone Density reports had done last year.  Patient was given opportunity to ask questions. Patient verbalized understanding of the plan and was able to repeat key elements of the plan. All questions were answered to their satisfaction.  [name], FNP    I, [name], FNP, have reviewed all documentation for this visit. The documentation on 10/08/20 for the exam, diagnosis, procedures, and orders are all accurate and complete.   IF YOU HAVE BEEN REFERRED TO A SPECIALIST, IT MAY TAKE 1-2 WEEKS TO SCHEDULE/PROCESS THE  REFERRAL. IF YOU HAVE NOT HEARD FROM US/SPECIALIST IN TWO WEEKS, PLEASE GIVE [place] A CALL AT [phone] X [phone].   THE PATIENT IS ENCOURAGED TO PRACTICE SOCIAL DISTANCING DUE TO THE COVID-19 PANDEMIC.

## 2020-10-11 ENCOUNTER — Ambulatory Visit (INDEPENDENT_AMBULATORY_CARE_PROVIDER_SITE_OTHER): Payer: Medicare HMO

## 2020-10-11 VITALS — Ht <= 58 in | Wt 150.0 lb

## 2020-10-11 DIAGNOSIS — Z0000 Encounter for general adult medical examination without abnormal findings: Secondary | ICD-10-CM | POA: Diagnosis not present

## 2020-10-11 DIAGNOSIS — J3081 Allergic rhinitis due to animal (cat) (dog) hair and dander: Secondary | ICD-10-CM | POA: Diagnosis not present

## 2020-10-11 DIAGNOSIS — J301 Allergic rhinitis due to pollen: Secondary | ICD-10-CM | POA: Diagnosis not present

## 2020-10-11 DIAGNOSIS — J3089 Other allergic rhinitis: Secondary | ICD-10-CM | POA: Diagnosis not present

## 2020-10-11 NOTE — Patient Instructions (Signed)
Linda Moreno , Thank you for taking time to come for your Medicare Wellness Visit. I appreciate your ongoing commitment to your health goals. Please review the following plan we discussed and let me know if I can assist you in the future.   Screening recommendations/referrals: Colonoscopy: completed 06/25/2020, due 06/26/2023 Mammogram: completed 06/27/2020 Bone Density: requested report Recommended yearly ophthalmology/optometry visit for glaucoma screening and checkup Recommended yearly dental visit for hygiene and checkup  Vaccinations: Influenza vaccine: completed 03/27/2020, due 12/24/2020 Pneumococcal vaccine: completed 12/21/2019 Tdap vaccine: completed 02/15/2018, due 02/16/2028 Shingles vaccine: completed   Covid-19: 12/21/2019, 08/13/2019, 07/09/2019  Advanced directives: Advance directive discussed with you today.   Conditions/risks identified: none  Next appointment: Follow up in one year for your annual wellness visit    Preventive Care 65 Years and Older, Female Preventive care refers to lifestyle choices and visits with your health care provider that can promote health and wellness. What does preventive care include?  A yearly physical exam. This is also called an annual well check.  Dental exams once or twice a year.  Routine eye exams. Ask your health care provider how often you should have your eyes checked.  Personal lifestyle choices, including:  Daily care of your teeth and gums.  Regular physical activity.  Eating a healthy diet.  Avoiding tobacco and drug use.  Limiting alcohol use.  Practicing safe sex.  Taking low-dose aspirin every day.  Taking vitamin and mineral supplements as recommended by your health care provider. What happens during an annual well check? The services and screenings done by your health care provider during your annual well check will depend on your age, overall health, lifestyle risk factors, and family history of  disease. Counseling  Your health care provider may ask you questions about your:  Alcohol use.  Tobacco use.  Drug use.  Emotional well-being.  Home and relationship well-being.  Sexual activity.  Eating habits.  History of falls.  Memory and ability to understand (cognition).  Work and work Statistician.  Reproductive health. Screening  You may have the following tests or measurements:  Height, weight, and BMI.  Blood pressure.  Lipid and cholesterol levels. These may be checked every 5 years, or more frequently if you are over 80 years old.  Skin check.  Lung cancer screening. You may have this screening every year starting at age 42 if you have a 30-pack-year history of smoking and currently smoke or have quit within the past 15 years.  Fecal occult blood test (FOBT) of the stool. You may have this test every year starting at age 79.  Flexible sigmoidoscopy or colonoscopy. You may have a sigmoidoscopy every 5 years or a colonoscopy every 10 years starting at age 62.  Hepatitis C blood test.  Hepatitis B blood test.  Sexually transmitted disease (STD) testing.  Diabetes screening. This is done by checking your blood sugar (glucose) after you have not eaten for a while (fasting). You may have this done every 1-3 years.  Bone density scan. This is done to screen for osteoporosis. You may have this done starting at age 58.  Mammogram. This may be done every 1-2 years. Talk to your health care provider about how often you should have regular mammograms. Talk with your health care provider about your test results, treatment options, and if necessary, the need for more tests. Vaccines  Your health care provider may recommend certain vaccines, such as:  Influenza vaccine. This is recommended every year.  Tetanus, diphtheria,  and acellular pertussis (Tdap, Td) vaccine. You may need a Td booster every 10 years.  Zoster vaccine. You may need this after age  72.  Pneumococcal 13-valent conjugate (PCV13) vaccine. One dose is recommended after age 48.  Pneumococcal polysaccharide (PPSV23) vaccine. One dose is recommended after age 49. Talk to your health care provider about which screenings and vaccines you need and how often you need them. This information is not intended to replace advice given to you by your health care provider. Make sure you discuss any questions you have with your health care provider. Document Released: 06/08/2015 Document Revised: 01/30/2016 Document Reviewed: 03/13/2015 Elsevier Interactive Patient Education  2017 [employer] Prevention in the Home Falls can cause injuries. They can happen to people of all ages. There are many things you can do to make your home safe and to help prevent falls. What can I do on the outside of my home?  Regularly fix the edges of walkways and driveways and fix any cracks.  Remove anything that might make you trip as you walk through a door, such as a raised step or threshold.  Trim any bushes or trees on the path to your home.  Use bright outdoor lighting.  Clear any walking paths of anything that might make someone trip, such as rocks or tools.  Regularly check to see if handrails are loose or broken. Make sure that both sides of any steps have handrails.  Any raised decks and porches should have guardrails on the edges.  Have any leaves, snow, or ice cleared regularly.  Use sand or salt on walking paths during winter.  Clean up any spills in your garage right away. This includes oil or grease spills. What can I do in the bathroom?  Use night lights.  Install grab bars by the toilet and in the tub and shower. Do not use towel bars as grab bars.  Use non-skid mats or decals in the tub or shower.  If you need to sit down in the shower, use a plastic, non-slip stool.  Keep the floor dry. Clean up any water that spills on the floor as soon as it happens.  Remove  soap buildup in the tub or shower regularly.  Attach bath mats securely with double-sided non-slip rug tape.  Do not have throw rugs and other things on the floor that can make you trip. What can I do in the bedroom?  Use night lights.  Make sure that you have a light by your bed that is easy to reach.  Do not use any sheets or blankets that are too big for your bed. They should not hang down onto the floor.  Have a firm chair that has side arms. You can use this for support while you get dressed.  Do not have throw rugs and other things on the floor that can make you trip. What can I do in the kitchen?  Clean up any spills right away.  Avoid walking on wet floors.  Keep items that you use a lot in easy-to-reach places.  If you need to reach something above you, use a strong step stool that has a grab bar.  Keep electrical cords out of the way.  Do not use floor polish or wax that makes floors slippery. If you must use wax, use non-skid floor wax.  Do not have throw rugs and other things on the floor that can make you trip. What can I do with my  stairs?  Do not leave any items on the stairs.  Make sure that there are handrails on both sides of the stairs and use them. Fix handrails that are broken or loose. Make sure that handrails are as long as the stairways.  Check any carpeting to make sure that it is firmly attached to the stairs. Fix any carpet that is loose or worn.  Avoid having throw rugs at the top or bottom of the stairs. If you do have throw rugs, attach them to the floor with carpet tape.  Make sure that you have a light switch at the top of the stairs and the bottom of the stairs. If you do not have them, ask someone to add them for you. What else can I do to help prevent falls?  Wear shoes that:  Do not have high heels.  Have rubber bottoms.  Are comfortable and fit you well.  Are closed at the toe. Do not wear sandals.  If you use a  stepladder:  Make sure that it is fully opened. Do not climb a closed stepladder.  Make sure that both sides of the stepladder are locked into place.  Ask someone to hold it for you, if possible.  Clearly mark and make sure that you can see:  Any grab bars or handrails.  First and last steps.  Where the edge of each step is.  Use tools that help you move around (mobility aids) if they are needed. These include:  Canes.  Walkers.  Scooters.  Crutches.  Turn on the lights when you go into a dark area. Replace any light bulbs as soon as they burn out.  Set up your furniture so you have a clear path. Avoid moving your furniture around.  If any of your floors are uneven, fix them.  If there are any pets around you, be aware of where they are.  Review your medicines with your doctor. Some medicines can make you feel dizzy. This can increase your chance of falling. Ask your doctor what other things that you can do to help prevent falls. This information is not intended to replace advice given to you by your health care provider. Make sure you discuss any questions you have with your health care provider. Document Released: 03/08/2009 Document Revised: 10/18/2015 Document Reviewed: 06/16/2014 Elsevier Interactive Patient Education  2017 [employer].

## 2020-10-11 NOTE — Progress Notes (Signed)
I connected with Linda Moreno today by telephone and verified that I am speaking with the correct person using two identifiers. Location patient: home Location provider: work Persons participating in the virtual visit: [place], Glenna Durand LPN.   I discussed the limitations, risks, security and privacy concerns of performing an evaluation and management service by telephone and the availability of in person appointments. I also discussed with the patient that there may be a patient responsible charge related to this service. The patient expressed understanding and verbally consented to this telephonic visit.    Interactive audio and video telecommunications were attempted between this provider and patient, however failed, due to patient having technical difficulties OR patient did not have access to video capability.  We continued and completed visit with audio only.     Vital signs may be patient reported or missing.  Subjective:   Linda Moreno is a 75 y.o. female who presents for Medicare Annual (Subsequent) preventive examination.  Review of Systems     Cardiac Risk Factors include: advanced age (>67men, >35 women);dyslipidemia;hypertension;obesity (BMI >30kg/m2)     Objective:    Today's Vitals   10/11/20 1440  Weight: 150 lb (68 kg)  Height: 4\' 9"  (1.448 m)   Body mass index is 32.46 kg/m.  Advanced Directives 10/11/2020 07/07/2019 04/06/2019 04/01/2018  Does Patient Have a Medical Advance Directive? No Yes No No  Type of Advance Directive - Healthcare Power of [place];Living will - -  Copy of [place] in Chart? - No - copy requested - -  Would patient like information on creating a medical advance directive? - - - No - Patient declined    Current Medications (verified) Outpatient Encounter Medications as of 10/11/2020  Medication Sig  . albuterol (PROVENTIL HFA;VENTOLIN HFA) 108 (90 Base) MCG/ACT inhaler Inhale 2 puffs into the lungs every 4 (four)  hours as needed for wheezing or shortness of breath.  Marland Kitchen amLODipine (NORVASC) 10 MG tablet TAKE 1 TABLET EVERY DAY  . atorvastatin (LIPITOR) 10 MG tablet TAKE 1 TABLET (10 MG TOTAL) BY MOUTH DAILY.  . calcium citrate (CALCITRATE - DOSED IN MG ELEMENTAL CALCIUM) 950 MG tablet Take 200 mg of elemental calcium by mouth daily.  . cetirizine (ZYRTEC ALLERGY) 10 MG tablet Take 1 tablet (10 mg total) by mouth daily.  . fluticasone furoate-vilanterol (BREO ELLIPTA) 100-25 MCG/INH AEPB Inhale 1 puff into the lungs daily.  Marland Kitchen ibandronate (BONIVA) 150 MG tablet TAKE 1 TABLET ORALLY ONCE MONTHLY WITH 8-10 OZ WATER. SIT UPRIGHT AND NO FOOD/DRINK FOR 1 HOUR.  . montelukast (SINGULAIR) 10 MG tablet Take 1 tablet (10 mg total) by mouth daily.  . multivitamin-iron-minerals-folic acid (CENTRUM) chewable tablet Chew 1 tablet by mouth daily.   No facility-administered encounter medications on file as of 10/11/2020.    Allergies (verified) Shellfish allergy   History: Past Medical History:  Diagnosis Date  . Asthma    History reviewed. No pertinent surgical history. Family History  Problem Relation Age of Onset  . Diabetes Father    Social History   Socioeconomic History  . Marital status: Married    Spouse name: Not on file  . Number of children: Not on file  . Years of education: Not on file  . Highest education level: Not on file  Occupational History  . Occupation: retired  Tobacco Use  . Smoking status: Never Smoker  . Smokeless tobacco: Never Used  Vaping Use  . Vaping Use: Never used  Substance and  Sexual Activity  . Alcohol use: No  . Drug use: No  . Sexual activity: Yes  Other Topics Concern  . Not on file  Social History Narrative  . Not on file   Social Determinants of Health   Financial Resource Strain: Low Risk   . Difficulty of Paying Living Expenses: Not hard at all  Food Insecurity: No Food Insecurity  . Worried About Charity fundraiser in the Last Year: Never true  .  Ran Out of Food in the Last Year: Never true  Transportation Needs: No Transportation Needs  . Lack of Transportation (Medical): No  . Lack of Transportation (Non-Medical): No  Physical Activity: Insufficiently Active  . Days of Exercise per Week: 7 days  . Minutes of Exercise per Session: 10 min  Stress: No Stress Concern Present  . Feeling of Stress : Not at all  Social Connections: Not on file    Tobacco Counseling Counseling given: Not Answered   Clinical Intake:  Pre-visit preparation completed: Yes  Pain : No/denies pain     Nutritional Status: BMI > 30  Obese Nutritional Risks: None Diabetes: No  How often do you need to have someone help you when you read instructions, pamphlets, or other written materials from your doctor or pharmacy?: 1 - Never What is the last grade level you completed in school?: 12th grade  Diabetic? no  Interpreter Needed?: No  Information entered by :: NAllen LPN   Activities of Daily Living In your present state of health, do you have any difficulty performing the following activities: 10/11/2020 10/08/2020  Hearing? N N  Vision? N N  Difficulty concentrating or making decisions? N N  Walking or climbing stairs? N N  Dressing or bathing? N N  Doing errands, shopping? N N  Preparing Food and eating ? N -  Using the Toilet? N -  In the past six months, have you accidently leaked urine? N -  Do you have problems with loss of bowel control? N -  Managing your Medications? N -  Managing your Finances? N -  Housekeeping or managing your Housekeeping? N -  Some recent data might be hidden    Patient Care Team: [name], FNP as PCP - General (General Practice)  Indicate any recent Medical Services you may have received from other than [facility] providers in the past year (date may be approximate).     Assessment:   This is a routine wellness examination for Linda Moreno.  Hearing/Vision screen No exam data present  Dietary issues and  exercise activities discussed: Current Exercise Habits: Home exercise routine, Type of exercise: Other - see comments (up and down stairs), Time (Minutes): 10, Frequency (Times/Week): 7, Weekly Exercise (Minutes/Week): 70  Goals Addressed            This Visit's Progress   . Patient Stated       10/11/2020, wants to weigh 125 pounds      Depression Screen PHQ 2/9 Scores 10/11/2020 10/08/2020 07/07/2019 07/07/2019 04/06/2019 03/08/2019 02/07/2019  PHQ - 2 Score 0 0 0 0 0 0 0  PHQ- 9 Score - - 0 - - - -    Fall Risk Fall Risk  10/11/2020 10/08/2020 07/07/2019 07/07/2019 04/06/2019  Falls in the past year? 0 0 0 0 0  Number falls in past yr: - - - - 0  Risk for fall due to : Medication side effect - Medication side effect - Medication side effect  Follow up Falls  evaluation completed;Education provided;Falls prevention discussed - Falls evaluation completed;Education provided;Falls prevention discussed - Falls evaluation completed;Education provided;Falls prevention discussed    FALL RISK PREVENTION PERTAINING TO THE HOME:  Any stairs in or around the home? Yes  If so, are there any without handrails? No  Home free of loose throw rugs in walkways, pet beds, electrical cords, etc? Yes  Adequate lighting in your home to reduce risk of falls? Yes   ASSISTIVE DEVICES UTILIZED TO PREVENT FALLS:  Life alert? No  Use of a cane, walker or w/c? No  Grab bars in the bathroom? No  Shower chair or bench in shower? No  Elevated toilet seat or a handicapped toilet? Yes   TIMED UP AND GO:  Was the test performed? No .     Cognitive Function:     6CIT Screen 10/11/2020 07/07/2019 04/06/2019 04/01/2018  What Year? 0 points 0 points 0 points 0 points  What month? 0 points 0 points 0 points 0 points  What time? 0 points 0 points 0 points 0 points  Count back from 20 0 points 0 points 0 points 0 points  Months in reverse 2 points 0 points 0 points 0 points  Repeat phrase 0 points 2 points 8  points 2 points  Total Score 2 2 8 2     Immunizations Immunization History  Administered Date(s) Administered  . Influenza, High Dose Seasonal PF 02/15/2018, 01/07/2019, 03/12/2020  . Influenza-Unspecified 01/07/2019, 03/27/2020  . PFIZER(Purple Top)SARS-COV-2 Vaccination 07/09/2019, 08/13/2019  . Tdap 02/15/2018  . Zoster Recombinat (Shingrix) 08/12/2017, 12/28/2017    TDAP status: Up to date  Flu Vaccine status: Up to date  Pneumococcal vaccine status: Up to date  Covid-19 vaccine status: Completed vaccines  Qualifies for Shingles Vaccine? Yes   Zostavax completed No   Shingrix Completed?: Yes  Screening Tests Health Maintenance  Topic Date Due  . DEXA SCAN  Never done  . INFLUENZA VACCINE  12/24/2020  . COLONOSCOPY (Pts 45-70yrs Insurance coverage will need to be confirmed)  06/25/2025  . TETANUS/TDAP  02/16/2028  . COVID-19 Vaccine  Completed  . Hepatitis C Screening  Completed  . PNA vac Low Risk Adult  Completed  . HPV VACCINES  Aged Out    Health Maintenance  Health Maintenance Due  Topic Date Due  . DEXA SCAN  Never done    Colorectal cancer screening: Type of screening: Colonoscopy. Completed 06/25/2020. Repeat every 3 years  Mammogram status: Completed 06/27/2020. Repeat every year  Bone Density status: requested report  Lung Cancer Screening: (Low Dose CT Chest recommended if Age 39-80 years, 30 pack-year currently smoking OR have quit w/in 15years.) does not qualify.   Lung Cancer Screening Referral: no  Additional Screening:  Hepatitis C Screening: does qualify; Completed 01/10/2019  Vision Screening: Recommended annual ophthalmology exams for early detection of glaucoma and other disorders of the eye. Is the patient up to date with their annual eye exam?  Yes  Who is the provider or what is the name of the office in which the patient attends annual eye exams? Dr. Katy Fitch If pt is not established with a provider, would they like to be referred to  a provider to establish care? No .   Dental Screening: Recommended annual dental exams for proper oral hygiene  Community Resource Referral / Chronic Care Management: CRR required this visit?  No   CCM required this visit?  No      Plan:     I have personally reviewed  and noted the following in the patient's chart:   . Medical and social history . Use of alcohol, tobacco or illicit drugs  . Current medications and supplements including opioid prescriptions.  . Functional ability and status . Nutritional status . Physical activity . Advanced directives . List of other physicians . Hospitalizations, surgeries, and ER visits in previous 12 months . Vitals . Screenings to include cognitive, depression, and falls . Referrals and appointments  In addition, I have reviewed and discussed with patient certain preventive protocols, quality metrics, and best practice recommendations. A written personalized care plan for preventive services as well as general preventive health recommendations were provided to patient.     Kellie Simmering, LPN   4/33/2951   Nurse Notes:

## 2020-10-16 ENCOUNTER — Telehealth: Payer: Self-pay

## 2020-10-16 NOTE — Telephone Encounter (Signed)
Pt vm full. Called pt wanting to know when she sn stop by to get her labs done, or if she has done so.

## 2020-10-25 ENCOUNTER — Telehealth: Payer: Self-pay

## 2020-10-25 DIAGNOSIS — J3089 Other allergic rhinitis: Secondary | ICD-10-CM | POA: Diagnosis not present

## 2020-10-25 DIAGNOSIS — J3081 Allergic rhinitis due to animal (cat) (dog) hair and dander: Secondary | ICD-10-CM | POA: Diagnosis not present

## 2020-10-25 DIAGNOSIS — J301 Allergic rhinitis due to pollen: Secondary | ICD-10-CM | POA: Diagnosis not present

## 2020-10-25 NOTE — Telephone Encounter (Signed)
I called pt to see if she could come by the office to do her labs. She did not stop by lab at her last office visit. I was unable to leave a vm YL,RMA

## 2020-11-07 DIAGNOSIS — J301 Allergic rhinitis due to pollen: Secondary | ICD-10-CM | POA: Diagnosis not present

## 2020-11-07 DIAGNOSIS — J3081 Allergic rhinitis due to animal (cat) (dog) hair and dander: Secondary | ICD-10-CM | POA: Diagnosis not present

## 2020-11-07 DIAGNOSIS — J3089 Other allergic rhinitis: Secondary | ICD-10-CM | POA: Diagnosis not present

## 2020-11-13 ENCOUNTER — Telehealth: Payer: Self-pay

## 2020-11-13 NOTE — Telephone Encounter (Signed)
Patient called requesting meds for her cough and congestion.  I returned her call and left her a vm to call the office [facility]

## 2020-11-22 DIAGNOSIS — J3089 Other allergic rhinitis: Secondary | ICD-10-CM | POA: Diagnosis not present

## 2020-11-22 DIAGNOSIS — J3081 Allergic rhinitis due to animal (cat) (dog) hair and dander: Secondary | ICD-10-CM | POA: Diagnosis not present

## 2020-11-22 DIAGNOSIS — J301 Allergic rhinitis due to pollen: Secondary | ICD-10-CM | POA: Diagnosis not present

## 2020-12-04 DIAGNOSIS — J3089 Other allergic rhinitis: Secondary | ICD-10-CM | POA: Diagnosis not present

## 2020-12-04 DIAGNOSIS — J3081 Allergic rhinitis due to animal (cat) (dog) hair and dander: Secondary | ICD-10-CM | POA: Diagnosis not present

## 2020-12-04 DIAGNOSIS — J301 Allergic rhinitis due to pollen: Secondary | ICD-10-CM | POA: Diagnosis not present

## 2020-12-21 DIAGNOSIS — J301 Allergic rhinitis due to pollen: Secondary | ICD-10-CM | POA: Diagnosis not present

## 2020-12-21 DIAGNOSIS — J4530 Mild persistent asthma, uncomplicated: Secondary | ICD-10-CM | POA: Diagnosis not present

## 2020-12-21 DIAGNOSIS — J3081 Allergic rhinitis due to animal (cat) (dog) hair and dander: Secondary | ICD-10-CM | POA: Diagnosis not present

## 2020-12-21 DIAGNOSIS — J3089 Other allergic rhinitis: Secondary | ICD-10-CM | POA: Diagnosis not present

## 2020-12-21 DIAGNOSIS — Z91013 Allergy to seafood: Secondary | ICD-10-CM | POA: Diagnosis not present

## 2020-12-31 DIAGNOSIS — J3081 Allergic rhinitis due to animal (cat) (dog) hair and dander: Secondary | ICD-10-CM | POA: Diagnosis not present

## 2020-12-31 DIAGNOSIS — J301 Allergic rhinitis due to pollen: Secondary | ICD-10-CM | POA: Diagnosis not present

## 2020-12-31 DIAGNOSIS — J3089 Other allergic rhinitis: Secondary | ICD-10-CM | POA: Diagnosis not present

## 2021-01-01 ENCOUNTER — Other Ambulatory Visit: Payer: Self-pay | Admitting: Nurse Practitioner

## 2021-01-01 DIAGNOSIS — I10 Essential (primary) hypertension: Secondary | ICD-10-CM

## 2021-01-15 DIAGNOSIS — J3081 Allergic rhinitis due to animal (cat) (dog) hair and dander: Secondary | ICD-10-CM | POA: Diagnosis not present

## 2021-01-15 DIAGNOSIS — J301 Allergic rhinitis due to pollen: Secondary | ICD-10-CM | POA: Diagnosis not present

## 2021-01-15 DIAGNOSIS — J3089 Other allergic rhinitis: Secondary | ICD-10-CM | POA: Diagnosis not present

## 2021-01-31 DIAGNOSIS — J3081 Allergic rhinitis due to animal (cat) (dog) hair and dander: Secondary | ICD-10-CM | POA: Diagnosis not present

## 2021-01-31 DIAGNOSIS — J301 Allergic rhinitis due to pollen: Secondary | ICD-10-CM | POA: Diagnosis not present

## 2021-01-31 DIAGNOSIS — J3089 Other allergic rhinitis: Secondary | ICD-10-CM | POA: Diagnosis not present

## 2021-02-11 DIAGNOSIS — Z6831 Body mass index (BMI) 31.0-31.9, adult: Secondary | ICD-10-CM | POA: Diagnosis not present

## 2021-02-11 DIAGNOSIS — Z01419 Encounter for gynecological examination (general) (routine) without abnormal findings: Secondary | ICD-10-CM | POA: Diagnosis not present

## 2021-02-14 DIAGNOSIS — J301 Allergic rhinitis due to pollen: Secondary | ICD-10-CM | POA: Diagnosis not present

## 2021-02-14 DIAGNOSIS — J3089 Other allergic rhinitis: Secondary | ICD-10-CM | POA: Diagnosis not present

## 2021-02-14 DIAGNOSIS — J3081 Allergic rhinitis due to animal (cat) (dog) hair and dander: Secondary | ICD-10-CM | POA: Diagnosis not present

## 2021-02-25 DIAGNOSIS — J301 Allergic rhinitis due to pollen: Secondary | ICD-10-CM | POA: Diagnosis not present

## 2021-02-25 DIAGNOSIS — J3081 Allergic rhinitis due to animal (cat) (dog) hair and dander: Secondary | ICD-10-CM | POA: Diagnosis not present

## 2021-02-25 DIAGNOSIS — J3089 Other allergic rhinitis: Secondary | ICD-10-CM | POA: Diagnosis not present

## 2021-03-20 DIAGNOSIS — J301 Allergic rhinitis due to pollen: Secondary | ICD-10-CM | POA: Diagnosis not present

## 2021-03-20 DIAGNOSIS — J3089 Other allergic rhinitis: Secondary | ICD-10-CM | POA: Diagnosis not present

## 2021-03-20 DIAGNOSIS — J3081 Allergic rhinitis due to animal (cat) (dog) hair and dander: Secondary | ICD-10-CM | POA: Diagnosis not present

## 2021-03-20 DIAGNOSIS — Z23 Encounter for immunization: Secondary | ICD-10-CM | POA: Diagnosis not present

## 2021-03-27 DIAGNOSIS — T63421D Toxic effect of venom of ants, accidental (unintentional), subsequent encounter: Secondary | ICD-10-CM | POA: Diagnosis not present

## 2021-03-27 DIAGNOSIS — J3081 Allergic rhinitis due to animal (cat) (dog) hair and dander: Secondary | ICD-10-CM | POA: Diagnosis not present

## 2021-03-27 DIAGNOSIS — J3089 Other allergic rhinitis: Secondary | ICD-10-CM | POA: Diagnosis not present

## 2021-03-27 DIAGNOSIS — J301 Allergic rhinitis due to pollen: Secondary | ICD-10-CM | POA: Diagnosis not present

## 2021-04-01 DIAGNOSIS — J301 Allergic rhinitis due to pollen: Secondary | ICD-10-CM | POA: Diagnosis not present

## 2021-04-01 DIAGNOSIS — J3089 Other allergic rhinitis: Secondary | ICD-10-CM | POA: Diagnosis not present

## 2021-04-01 DIAGNOSIS — J3081 Allergic rhinitis due to animal (cat) (dog) hair and dander: Secondary | ICD-10-CM | POA: Diagnosis not present

## 2021-04-08 DIAGNOSIS — J3081 Allergic rhinitis due to animal (cat) (dog) hair and dander: Secondary | ICD-10-CM | POA: Diagnosis not present

## 2021-04-08 DIAGNOSIS — J3089 Other allergic rhinitis: Secondary | ICD-10-CM | POA: Diagnosis not present

## 2021-04-08 DIAGNOSIS — J301 Allergic rhinitis due to pollen: Secondary | ICD-10-CM | POA: Diagnosis not present

## 2021-04-11 ENCOUNTER — Ambulatory Visit (INDEPENDENT_AMBULATORY_CARE_PROVIDER_SITE_OTHER): Payer: Medicare HMO | Admitting: Nurse Practitioner

## 2021-04-11 ENCOUNTER — Encounter: Payer: Self-pay | Admitting: Nurse Practitioner

## 2021-04-11 ENCOUNTER — Other Ambulatory Visit: Payer: Self-pay

## 2021-04-11 VITALS — BP 128/78 | HR 82 | Temp 98.8°F | Ht <= 58 in | Wt 149.2 lb

## 2021-04-11 DIAGNOSIS — E782 Mixed hyperlipidemia: Secondary | ICD-10-CM | POA: Diagnosis not present

## 2021-04-11 DIAGNOSIS — J45909 Unspecified asthma, uncomplicated: Secondary | ICD-10-CM

## 2021-04-11 DIAGNOSIS — E559 Vitamin D deficiency, unspecified: Secondary | ICD-10-CM

## 2021-04-11 DIAGNOSIS — M810 Age-related osteoporosis without current pathological fracture: Secondary | ICD-10-CM | POA: Diagnosis not present

## 2021-04-11 DIAGNOSIS — R9431 Abnormal electrocardiogram [ECG] [EKG]: Secondary | ICD-10-CM | POA: Diagnosis not present

## 2021-04-11 DIAGNOSIS — I10 Essential (primary) hypertension: Secondary | ICD-10-CM

## 2021-04-11 DIAGNOSIS — Z0000 Encounter for general adult medical examination without abnormal findings: Secondary | ICD-10-CM

## 2021-04-11 DIAGNOSIS — Z79899 Other long term (current) drug therapy: Secondary | ICD-10-CM | POA: Diagnosis not present

## 2021-04-11 DIAGNOSIS — Z0001 Encounter for general adult medical examination with abnormal findings: Secondary | ICD-10-CM

## 2021-04-11 LAB — POCT URINALYSIS DIPSTICK
Bilirubin, UA: NEGATIVE
Blood, UA: NEGATIVE
Glucose, UA: NEGATIVE
Ketones, UA: NEGATIVE
Leukocytes, UA: NEGATIVE
Nitrite, UA: NEGATIVE
Protein, UA: NEGATIVE
Spec Grav, UA: 1.025 (ref 1.010–1.025)
Urobilinogen, UA: 1 E.U./dL
pH, UA: 5.5 (ref 5.0–8.0)

## 2021-04-11 NOTE — Patient Instructions (Signed)

## 2021-04-11 NOTE — Progress Notes (Signed)
I,Tianna Badgett,acting as a Education administrator for [employer], FNP.,have documented all relevant documentation on the behalf of [name], FNP,as directed by  [name], FNP while in the presence of [name], [employer].  This visit occurred during the SARS-CoV-2 public health emergency.  Safety protocols were in place, including screening questions prior to the visit, additional usage of staff PPE, and extensive cleaning of exam room while observing appropriate contact time as indicated for disinfecting solutions.  Subjective:     Patient ID: Linda Moreno , female    DOB: [birth year] , 75 y.o.   MRN: [id number]   Chief Complaint  Patient presents with   Annual Exam    HPI  Here for HM   Wt Readings from Last 3 Encounters: 04/11/21 : 149 lb 3.2 oz (67.7 kg) 10/11/20 : 150 lb (68 kg) 10/08/20 : 150 lb 9.6 oz (68.3 kg)      Past Medical History:  Diagnosis Date   Asthma      Family History  Problem Relation Age of Onset   Diabetes Father      Current Outpatient Medications:    albuterol (PROVENTIL HFA;VENTOLIN HFA) 108 (90 Base) MCG/ACT inhaler, Inhale 2 puffs into the lungs every 4 (four) hours as needed for wheezing or shortness of breath., Disp: , Rfl:    amLODipine (NORVASC) 10 MG tablet, TAKE 1 TABLET EVERY DAY, Disp: 90 tablet, Rfl: 0   atorvastatin (LIPITOR) 10 MG tablet, TAKE 1 TABLET (10 MG TOTAL) BY MOUTH DAILY., Disp: 90 tablet, Rfl: 1   calcium citrate (CALCITRATE - DOSED IN MG ELEMENTAL CALCIUM) 950 MG tablet, Take 200 mg of elemental calcium by mouth daily., Disp: , Rfl:    cetirizine (ZYRTEC ALLERGY) 10 MG tablet, Take 1 tablet (10 mg total) by mouth daily., Disp: 90 tablet, Rfl: 1   fluticasone furoate-vilanterol (BREO ELLIPTA) 100-25 MCG/INH AEPB, Inhale 1 puff into the lungs daily., Disp: , Rfl:    ibandronate (BONIVA) 150 MG tablet, TAKE 1 TABLET ORALLY ONCE MONTHLY WITH 8-10 OZ WATER. SIT UPRIGHT AND NO FOOD/DRINK FOR 1 HOUR., Disp: 30 tablet, Rfl: 0    montelukast (SINGULAIR) 10 MG tablet, Take 1 tablet (10 mg total) by mouth daily., Disp: 90 tablet, Rfl: 1   multivitamin-iron-minerals-folic acid (CENTRUM) chewable tablet, Chew 1 tablet by mouth daily., Disp: , Rfl:    Allergies  Allergen Reactions   Shellfish Allergy       The patient states she is post menopausal status.  No LMP recorded. Patient is postmenopausal.. Negative for Dysmenorrhea and Negative for Menorrhagia. Negative for: breast discharge, breast lump(s), breast pain and breast self exam. Associated symptoms include abnormal vaginal bleeding. Pertinent negatives include abnormal bleeding (hematology), anxiety, decreased libido, depression, difficulty falling sleep, dyspareunia, history of infertility, nocturia, sexual dysfunction, sleep disturbances, urinary incontinence, urinary urgency, vaginal discharge and vaginal itching. Diet regular; low salt, fresh greens and low sodium canned goods. The patient states her exercise level is minimal - she cares for a 76 year old.     The patient's tobacco use is:  Social History   Tobacco Use  Smoking Status Never  Smokeless Tobacco Never   She has been exposed to passive smoke. The patient's alcohol use is:  Social History   Substance and Sexual Activity  Alcohol Use No    Review of Systems  Constitutional: Negative.   HENT: Negative.    Eyes: Negative.   Respiratory: Negative.    Cardiovascular: Negative.   Gastrointestinal: Negative.  Endocrine: Negative.   Genitourinary: Negative.   Musculoskeletal:  Positive for arthralgias (right hip - she has been using "a cream they use on horses").  Skin: Negative.   Allergic/Immunologic: Negative.   Neurological: Negative.   Hematological: Negative.   Psychiatric/Behavioral: Negative.      Today's Vitals   04/11/21 1147  BP: 128/78  Pulse: 82  Temp: 98.8 F (37.1 C)  TempSrc: Oral  Weight: 149 lb 3.2 oz (67.7 kg)  Height: 4' 9"  (1.448 m)   Body mass index is 32.29  kg/m.   Objective:  Physical Exam Vitals reviewed.  Constitutional:      General: She is not in acute distress.    Appearance: Normal appearance. She is well-developed. She is obese.  HENT:     Head: Normocephalic and atraumatic.     Right Ear: Hearing, tympanic membrane, ear canal and external ear normal. There is no impacted cerumen.     Left Ear: Hearing, tympanic membrane, ear canal and external ear normal. There is no impacted cerumen.     Nose:     Comments: Deferred - masked    Mouth/Throat:     Comments: Deferred - masked Eyes:     General: Lids are normal.     Extraocular Movements: Extraocular movements intact.     Conjunctiva/sclera: Conjunctivae normal.     Pupils: Pupils are equal, round, and reactive to light.     Funduscopic exam:    Right eye: No papilledema.        Left eye: No papilledema.  Neck:     Thyroid: No thyroid mass.     Vascular: No carotid bruit.  Cardiovascular:     Rate and Rhythm: Normal rate and regular rhythm.     Pulses: Normal pulses.     Heart sounds: Normal heart sounds. No murmur heard. Pulmonary:     Effort: Pulmonary effort is normal. No respiratory distress.     Breath sounds: Normal breath sounds. No wheezing.  Chest:     Chest wall: No mass.  Breasts:    Tanner Score is 5.     Right: Normal. No swelling, mass or tenderness.     Left: Normal. No swelling, mass or tenderness.  Abdominal:     General: Abdomen is flat. Bowel sounds are normal. There is no distension.     Palpations: Abdomen is soft.     Tenderness: There is no abdominal tenderness.  Genitourinary:    Comments: Deferred - seen by GYN Musculoskeletal:        General: No swelling or tenderness. Normal range of motion.     Cervical back: Full passive range of motion without pain, normal range of motion and neck supple.     Right lower leg: No edema.     Left lower leg: No edema.  Lymphadenopathy:     Upper Body:     Right upper body: No supraclavicular,  axillary or pectoral adenopathy.     Left upper body: No supraclavicular, axillary or pectoral adenopathy.  Skin:    General: Skin is warm and dry.     Capillary Refill: Capillary refill takes less than 2 seconds.  Neurological:     General: No focal deficit present.     Mental Status: She is alert and oriented to person, place, and time.     Cranial Nerves: No cranial nerve deficit.     Sensory: No sensory deficit.     Motor: No weakness.  Psychiatric:  Mood and Affect: Mood normal.        Behavior: Behavior normal.        Thought Content: Thought content normal.        Judgment: Judgment normal.        Assessment And Plan:     1. Encounter for annual physical exam Behavior modifications discussed and diet history reviewed.   Pt will continue to exercise regularly and modify diet with low GI, plant based foods and decrease intake of processed foods.  Recommend intake of daily multivitamin, Vitamin D, and calcium.  Recommend mammogram (will get records from Dr. Radene Knee along with PAP) for preventive screenings, as well as recommend immunizations that include influenza, TDAP  2. Age-related osteoporosis without current pathological fracture She is taking alendronate  3. Essential hypertension Comments: Blood pressure is much better controlled. Continue current medications and low salt diet - POCT Urinalysis Dipstick (81002) - POCT UA - Microalbumin - EKG 12-Lead - CMP14+EGFR - Ambulatory referral to Cardiology  4. Mixed hyperlipidemia Chronic, controlled Continue with current medications - Lipid panel  5. Vitamin D deficiency Will check vitamin D level and supplement as needed.    Also encouraged to spend 15 minutes in the sun daily.  - VITAMIN D 25 Hydroxy (Vit-D Deficiency, Fractures)  6. Abnormal EKG Comments: She has new right bundle branch block and concern for right ventricle hypertrophy, will refer to Cardiology for evaluation   7. Other long term  (current) drug therapy - CBC - TSH  8. Moderate asthma without complication, unspecified whether persistent Comments: Stable, being managed by Dr. Donneta Romberg   Patient was given opportunity to ask questions. Patient verbalized understanding of the plan and was able to repeat key elements of the plan. All questions were answered to their satisfaction.   [name], FNP   I, [name], FNP, have reviewed all documentation for this visit. The documentation on 04/11/21 for the exam, diagnosis, procedures, and orders are all accurate and complete.   THE PATIENT IS ENCOURAGED TO PRACTICE SOCIAL DISTANCING DUE TO THE COVID-19 PANDEMIC.

## 2021-04-12 LAB — TSH: TSH: 1.32 u[IU]/mL (ref 0.450–4.500)

## 2021-04-12 LAB — CMP14+EGFR
ALT: 18 IU/L (ref 0–32)
AST: 26 IU/L (ref 0–40)
Albumin/Globulin Ratio: 1.4 (ref 1.2–2.2)
Albumin: 4.7 g/dL (ref 3.7–4.7)
Alkaline Phosphatase: 102 IU/L (ref 44–121)
BUN/Creatinine Ratio: 18 (ref 12–28)
BUN: 14 mg/dL (ref 8–27)
Bilirubin Total: 0.5 mg/dL (ref 0.0–1.2)
CO2: 26 mmol/L (ref 20–29)
Calcium: 9.4 mg/dL (ref 8.7–10.3)
Chloride: 100 mmol/L (ref 96–106)
Creatinine, Ser: 0.79 mg/dL (ref 0.57–1.00)
Globulin, Total: 3.3 g/dL (ref 1.5–4.5)
Glucose: 94 mg/dL (ref 70–99)
Potassium: 4.3 mmol/L (ref 3.5–5.2)
Sodium: 139 mmol/L (ref 134–144)
Total Protein: 8 g/dL (ref 6.0–8.5)
eGFR: 77 mL/min/{1.73_m2} (ref >59)

## 2021-04-12 LAB — CBC
Hematocrit: 43.2 % (ref 34.0–46.6)
Hemoglobin: 14.4 g/dL (ref 11.1–15.9)
MCH: 29.5 pg (ref 26.6–33.0)
MCHC: 33.3 g/dL (ref 31.5–35.7)
MCV: 89 fL (ref 79–97)
Platelets: 241 10*3/uL (ref 150–450)
RBC: 4.88 x10E6/uL (ref 3.77–5.28)
RDW: 12.1 % (ref 11.7–15.4)
WBC: 5.1 10*3/uL (ref 3.4–10.8)

## 2021-04-12 LAB — LIPID PANEL
Chol/HDL Ratio: 2.3 ratio (ref 0.0–4.4)
Cholesterol, Total: 181 mg/dL (ref 100–199)
HDL: 79 mg/dL (ref >39)
LDL Chol Calc (NIH): 89 mg/dL (ref 0–99)
Triglycerides: 69 mg/dL (ref 0–149)
VLDL Cholesterol Cal: 13 mg/dL (ref 5–40)

## 2021-04-12 LAB — VITAMIN D 25 HYDROXY (VIT D DEFICIENCY, FRACTURES): Vit D, 25-Hydroxy: 56 ng/mL (ref 30.0–100.0)

## 2021-04-15 DIAGNOSIS — J301 Allergic rhinitis due to pollen: Secondary | ICD-10-CM | POA: Diagnosis not present

## 2021-04-15 DIAGNOSIS — J3081 Allergic rhinitis due to animal (cat) (dog) hair and dander: Secondary | ICD-10-CM | POA: Diagnosis not present

## 2021-04-15 DIAGNOSIS — J3089 Other allergic rhinitis: Secondary | ICD-10-CM | POA: Diagnosis not present

## 2021-04-22 DIAGNOSIS — J3081 Allergic rhinitis due to animal (cat) (dog) hair and dander: Secondary | ICD-10-CM | POA: Diagnosis not present

## 2021-04-22 DIAGNOSIS — J3089 Other allergic rhinitis: Secondary | ICD-10-CM | POA: Diagnosis not present

## 2021-04-22 DIAGNOSIS — J301 Allergic rhinitis due to pollen: Secondary | ICD-10-CM | POA: Diagnosis not present

## 2021-04-29 DIAGNOSIS — J301 Allergic rhinitis due to pollen: Secondary | ICD-10-CM | POA: Diagnosis not present

## 2021-04-29 DIAGNOSIS — J3089 Other allergic rhinitis: Secondary | ICD-10-CM | POA: Diagnosis not present

## 2021-04-29 DIAGNOSIS — J3081 Allergic rhinitis due to animal (cat) (dog) hair and dander: Secondary | ICD-10-CM | POA: Diagnosis not present

## 2021-05-06 DIAGNOSIS — J3089 Other allergic rhinitis: Secondary | ICD-10-CM | POA: Diagnosis not present

## 2021-05-06 DIAGNOSIS — J301 Allergic rhinitis due to pollen: Secondary | ICD-10-CM | POA: Diagnosis not present

## 2021-05-06 DIAGNOSIS — J3081 Allergic rhinitis due to animal (cat) (dog) hair and dander: Secondary | ICD-10-CM | POA: Diagnosis not present

## 2021-05-07 ENCOUNTER — Encounter: Payer: Self-pay | Admitting: Nurse Practitioner

## 2021-05-13 DIAGNOSIS — J3089 Other allergic rhinitis: Secondary | ICD-10-CM | POA: Diagnosis not present

## 2021-05-13 DIAGNOSIS — J301 Allergic rhinitis due to pollen: Secondary | ICD-10-CM | POA: Diagnosis not present

## 2021-05-13 DIAGNOSIS — J3081 Allergic rhinitis due to animal (cat) (dog) hair and dander: Secondary | ICD-10-CM | POA: Diagnosis not present

## 2021-05-21 ENCOUNTER — Other Ambulatory Visit: Payer: Self-pay | Admitting: Nurse Practitioner

## 2021-05-21 DIAGNOSIS — J3089 Other allergic rhinitis: Secondary | ICD-10-CM | POA: Diagnosis not present

## 2021-05-21 DIAGNOSIS — J301 Allergic rhinitis due to pollen: Secondary | ICD-10-CM | POA: Diagnosis not present

## 2021-05-21 DIAGNOSIS — J3081 Allergic rhinitis due to animal (cat) (dog) hair and dander: Secondary | ICD-10-CM | POA: Diagnosis not present

## 2021-05-21 DIAGNOSIS — Z1231 Encounter for screening mammogram for malignant neoplasm of breast: Secondary | ICD-10-CM

## 2021-05-28 DIAGNOSIS — J3081 Allergic rhinitis due to animal (cat) (dog) hair and dander: Secondary | ICD-10-CM | POA: Diagnosis not present

## 2021-05-28 DIAGNOSIS — J3089 Other allergic rhinitis: Secondary | ICD-10-CM | POA: Diagnosis not present

## 2021-05-28 DIAGNOSIS — J301 Allergic rhinitis due to pollen: Secondary | ICD-10-CM | POA: Diagnosis not present

## 2021-05-29 ENCOUNTER — Other Ambulatory Visit: Payer: Self-pay | Admitting: Nurse Practitioner

## 2021-05-29 DIAGNOSIS — E782 Mixed hyperlipidemia: Secondary | ICD-10-CM

## 2021-06-03 ENCOUNTER — Other Ambulatory Visit: Payer: Self-pay | Admitting: Nurse Practitioner

## 2021-06-03 DIAGNOSIS — I10 Essential (primary) hypertension: Secondary | ICD-10-CM

## 2021-06-07 DIAGNOSIS — J3081 Allergic rhinitis due to animal (cat) (dog) hair and dander: Secondary | ICD-10-CM | POA: Diagnosis not present

## 2021-06-07 DIAGNOSIS — J301 Allergic rhinitis due to pollen: Secondary | ICD-10-CM | POA: Diagnosis not present

## 2021-06-07 DIAGNOSIS — J3089 Other allergic rhinitis: Secondary | ICD-10-CM | POA: Diagnosis not present

## 2021-06-11 ENCOUNTER — Telehealth: Payer: Self-pay | Admitting: Nurse Practitioner

## 2021-06-11 NOTE — Telephone Encounter (Signed)
Tried calling patient to schedule Medicare Annual Wellness Visit (AWV) either virtually or in office.   No answer  Last AWV 10/11/20  please schedule at anytime with [facility] insurance can be schedule calendar year   This should be a 45 minute visit.

## 2021-06-12 DIAGNOSIS — J3089 Other allergic rhinitis: Secondary | ICD-10-CM | POA: Diagnosis not present

## 2021-06-12 DIAGNOSIS — J3081 Allergic rhinitis due to animal (cat) (dog) hair and dander: Secondary | ICD-10-CM | POA: Diagnosis not present

## 2021-06-12 DIAGNOSIS — J301 Allergic rhinitis due to pollen: Secondary | ICD-10-CM | POA: Diagnosis not present

## 2021-06-17 ENCOUNTER — Emergency Department (HOSPITAL_COMMUNITY): Payer: Medicare HMO

## 2021-06-17 ENCOUNTER — Encounter (HOSPITAL_COMMUNITY): Payer: Self-pay | Admitting: Emergency Medicine

## 2021-06-17 ENCOUNTER — Other Ambulatory Visit: Payer: Self-pay

## 2021-06-17 ENCOUNTER — Emergency Department (HOSPITAL_COMMUNITY)
Admission: EM | Admit: 2021-06-17 | Discharge: 2021-06-17 | Disposition: A | Discharge status: Home / Self Care | Payer: Medicare HMO | Attending: Emergency Medicine | Admitting: Emergency Medicine

## 2021-06-17 DIAGNOSIS — J45909 Unspecified asthma, uncomplicated: Secondary | ICD-10-CM | POA: Diagnosis not present

## 2021-06-17 DIAGNOSIS — I700 Atherosclerosis of aorta: Secondary | ICD-10-CM | POA: Diagnosis not present

## 2021-06-17 DIAGNOSIS — R0602 Shortness of breath: Secondary | ICD-10-CM | POA: Diagnosis not present

## 2021-06-17 DIAGNOSIS — Z7951 Long term (current) use of inhaled steroids: Secondary | ICD-10-CM | POA: Insufficient documentation

## 2021-06-17 DIAGNOSIS — Z79899 Other long term (current) drug therapy: Secondary | ICD-10-CM | POA: Insufficient documentation

## 2021-06-17 DIAGNOSIS — Z20822 Contact with and (suspected) exposure to covid-19: Secondary | ICD-10-CM | POA: Diagnosis not present

## 2021-06-17 DIAGNOSIS — J4530 Mild persistent asthma, uncomplicated: Secondary | ICD-10-CM | POA: Diagnosis not present

## 2021-06-17 LAB — BASIC METABOLIC PANEL
Anion gap: 12 (ref 5–15)
BUN: 11 mg/dL (ref 8–23)
CO2: 25 mmol/L (ref 22–32)
Calcium: 9.2 mg/dL (ref 8.9–10.3)
Chloride: 101 mmol/L (ref 98–111)
Creatinine, Ser: 0.73 mg/dL (ref 0.44–1.00)
GFR, Estimated: >60 mL/min (ref >60)
Glucose, Bld: 108 mg/dL — ABNORMAL HIGH (ref 70–99)
Potassium: 5.4 mmol/L — ABNORMAL HIGH (ref 3.5–5.1)
Sodium: 138 mmol/L (ref 135–145)

## 2021-06-17 LAB — CBC
HCT: 44 % (ref 36.0–46.0)
Hemoglobin: 14.6 g/dL (ref 12.0–15.0)
MCH: 29.7 pg (ref 26.0–34.0)
MCHC: 33.2 g/dL (ref 30.0–36.0)
MCV: 89.4 fL (ref 80.0–100.0)
Platelets: 285 10*3/uL (ref 150–400)
RBC: 4.92 MIL/uL (ref 3.87–5.11)
RDW: 12.1 % (ref 11.5–15.5)
WBC: 6.7 10*3/uL (ref 4.0–10.5)
nRBC: 0 % (ref 0.0–0.2)

## 2021-06-17 LAB — RESP PANEL BY RT-PCR (FLU A&B, COVID) ARPGX2
Influenza A by PCR: NEGATIVE
Influenza B by PCR: NEGATIVE
SARS Coronavirus 2 by RT PCR: NEGATIVE

## 2021-06-17 MED ORDER — METHYLPREDNISOLONE SODIUM SUCC 125 MG IJ SOLR
125.0000 mg | Freq: Once | INTRAMUSCULAR | Status: AC
  Administered 2021-06-17: 125 mg via INTRAVENOUS
  Filled 2021-06-17: qty 2

## 2021-06-17 MED ORDER — IPRATROPIUM-ALBUTEROL 0.5-2.5 (3) MG/3ML IN SOLN
3.0000 mL | Freq: Once | RESPIRATORY_TRACT | Status: AC
  Administered 2021-06-17: 3 mL via RESPIRATORY_TRACT
  Filled 2021-06-17: qty 3

## 2021-06-17 MED ORDER — ALBUTEROL SULFATE (2.5 MG/3ML) 0.083% IN NEBU
7.5000 mg/h | INHALATION_SOLUTION | Freq: Once | RESPIRATORY_TRACT | Status: AC
  Administered 2021-06-17: 7.5 mg/h via RESPIRATORY_TRACT
  Filled 2021-06-17: qty 3

## 2021-06-17 MED ORDER — MAGNESIUM SULFATE 2 GM/50ML IV SOLN
2.0000 g | Freq: Once | INTRAVENOUS | Status: AC
  Administered 2021-06-17: 2 g via INTRAVENOUS
  Filled 2021-06-17: qty 50

## 2021-06-17 MED ORDER — AEROCHAMBER PLUS FLO-VU MEDIUM MISC
1.0000 | Freq: Once | Status: AC
  Administered 2021-06-17: 1
  Filled 2021-06-17: qty 1

## 2021-06-17 MED ORDER — IOHEXOL 300 MG/ML  SOLN
75.0000 mL | Freq: Once | INTRAMUSCULAR | Status: AC | PRN
  Administered 2021-06-17: 75 mL via INTRAVENOUS

## 2021-06-17 MED ORDER — ALBUTEROL SULFATE HFA 108 (90 BASE) MCG/ACT IN AERS
6.0000 | INHALATION_SPRAY | Freq: Once | RESPIRATORY_TRACT | Status: AC
  Administered 2021-06-17: 6 via RESPIRATORY_TRACT
  Filled 2021-06-17: qty 6.7

## 2021-06-17 MED ORDER — ALBUTEROL SULFATE (2.5 MG/3ML) 0.083% IN NEBU
2.5000 mg | INHALATION_SOLUTION | Freq: Once | RESPIRATORY_TRACT | Status: AC
  Administered 2021-06-17: 2.5 mg via RESPIRATORY_TRACT
  Filled 2021-06-17: qty 3

## 2021-06-17 NOTE — ED Provider Notes (Signed)
°  I assumed the care of Ms. Colberg from Wallace PA at approximately 1500.  Physical Exam  BP (!) 165/96    Pulse 83    Temp 98.5 F (36.9 C)    Resp (!) 21    SpO2 95%   On my reevaluation of the patient after receiving duo nebs she continues to have bilateral inspiratory expiratory wheezing.  Procedures  Procedures  ED Course / MDM    Medical Decision Making Amount and/or Complexity of Data Reviewed Labs: ordered. Radiology: ordered.  Risk Prescription drug management.   After my reevaluation the patient was having continued wheezing I ordered a continuous albuterol treatment.  Following this albuterol treatment the patient's wheezing had significantly improved.  Given that the patient had significant improvement of wheezing I suspect that she would be safe for discharge home.  The patient no longer has any retractions.  The patient is able to speak in full complete sentences.  I do not feel that the patient likely has pneumonia at this time.  The patient does not appear to be hypoxic and was not hypoxic on the pulse ox monitor.  Will discharge patient home with instructions to follow-up with her primary care provider.  We will also instruct the patient to use her albuterol inhaler every 4 hours for the next 24 hours.       Zachery Dakins, MD 06/17/21 2143    Lorelle Gibbs, DO 06/17/21 2156

## 2021-06-17 NOTE — Discharge Instructions (Signed)
Please continue taking your albuterol inhaler every 4 hours for the next 24 hours  Please feel free to return to our emergency department if you start feeling shortness of breath or chest pain.  We are also here 24 hours a day if you feel that you need to be reevaluated

## 2021-06-17 NOTE — ED Provider Triage Note (Signed)
Emergency Medicine Provider Triage Evaluation Note  Linda Moreno , a 76 y.o. female  was evaluated in triage.  Pt complains of shortness of breath since Saturday.  She ran out of her breo on Wednesday.   She was at her pulmonologist office who sent her here.  She thinks that it is due to the time of year and it being moist out.  She denies any fevers.  She says this feels consistent with her usual asthma exacerbations.   Physical Exam  BP (!) 162/98 (BP Location: Right Arm)    Pulse (!) 102    Temp 98.5 F (36.9 C)    Resp (!) 21    SpO2 95%  Gen:   Awake, no distress   Resp:  Increased rate and effort, frequent coughing.  Wheezing audible from the door without stethoscope, decreased air movement.  MSK:   Moves extremities without difficulty  Other:  Calm, pleasant.   Medical Decision Making  Medically screening exam initiated at 11:09 AM.  Appropriate orders placed.  Linden J Brocious was informed that the remainder of the evaluation will be completed by another provider, this initial triage assessment does not replace that evaluation, and the importance of remaining in the ED until their evaluation is complete.  Charge RN informed of patient's condition at 1116   6 puffs of albuterol was ordered and given to patient.     Lorin Glass, [place] 06/17/21 1117

## 2021-06-17 NOTE — ED Triage Notes (Signed)
Pt with hx of asthma from home, sent by allergist for eval of sob and wheezing x 2 days. Using home inhaler and nebulizer without improvement. Audible wheezing in triage.

## 2021-06-17 NOTE — ED Notes (Signed)
Patient transported to CT 

## 2021-06-17 NOTE — ED Provider Notes (Signed)
[facility] EMERGENCY DEPARTMENT Provider Note   CSN: [id number] Arrival date & time: 06/17/21  5397     History Chief Complaint  Patient presents with   Shortness of Breath    Linda Moreno is a 76 y.o. female with history of asthma presents to the ED for evaluation of her wheezing and SOB for the past 3 days. She denies any chest pain, nausea, vomiting, diaphoresis, or lightheadedness.  The patient reports she called her allergist office today and was told to come here immediately. Currently she is on Breo and albuterol for her asthma and reports compliancy. She reports she has had asthma exacerbations before, which is what this feels like to her, but has never been hospitalized or intubated d/t her asthma. Denies any tobacco use.    Shortness of Breath Associated symptoms: wheezing   Associated symptoms: no chest pain, no cough, no fever, no sore throat and no vomiting       Home Medications Prior to Admission medications   Medication Sig Start Date End Date Taking? Authorizing Provider  albuterol (PROVENTIL HFA;VENTOLIN HFA) 108 (90 Base) MCG/ACT inhaler Inhale 2 puffs into the lungs every 4 (four) hours as needed for wheezing or shortness of breath.    [provider]  amLODipine (NORVASC) 10 MG tablet TAKE 1 TABLET EVERY DAY 06/03/21   [name], FNP  atorvastatin (LIPITOR) 10 MG tablet TAKE 1 TABLET EVERY DAY 05/30/21   [name], FNP  calcium citrate (CALCITRATE - DOSED IN MG ELEMENTAL CALCIUM) 950 MG tablet Take 200 mg of elemental calcium by mouth daily.    [provider]  cetirizine (ZYRTEC ALLERGY) 10 MG tablet Take 1 tablet (10 mg total) by mouth daily. 10/08/20   [name], FNP  fluticasone furoate-vilanterol (BREO ELLIPTA) 100-25 MCG/INH AEPB Inhale 1 puff into the lungs daily.    [provider]  ibandronate (BONIVA) 150 MG tablet TAKE 1 TABLET ORALLY ONCE MONTHLY WITH 8-10 OZ WATER. SIT UPRIGHT AND NO FOOD/DRINK FOR  1 HOUR. 05/09/20   [name], FNP  montelukast (SINGULAIR) 10 MG tablet Take 1 tablet (10 mg total) by mouth daily. 10/08/20 10/08/21  [name], FNP  multivitamin-iron-minerals-folic acid (CENTRUM) chewable tablet Chew 1 tablet by mouth daily.    [provider]      Allergies    Shellfish allergy    Review of Systems   Review of Systems  Constitutional:  Negative for chills and fever.  HENT:  Negative for congestion, rhinorrhea, sore throat and trouble swallowing.   Respiratory:  Positive for shortness of breath and wheezing. Negative for cough.   Cardiovascular:  Negative for chest pain, palpitations and leg swelling.  Gastrointestinal:  Negative for nausea and vomiting.  All other systems reviewed and are negative.  Physical Exam Updated Vital Signs BP (!) 165/96    Pulse 83    Temp 98.5 F (36.9 C)    Resp (!) 21    SpO2 95%  Physical Exam Vitals and nursing note reviewed.  Constitutional:      General: She is not in acute distress.    Appearance: She is not toxic-appearing or diaphoretic.  HENT:     Mouth/Throat:     Mouth: Mucous membranes are moist.     Pharynx: No pharyngeal swelling.  Cardiovascular:     Rate and Rhythm: Normal rate.  Pulmonary:     Effort: Tachypnea present. No accessory muscle usage or respiratory distress.     Breath sounds:  Wheezing present.     Comments: Inspiratory and expiratory wheezing heard throughout.  She is not any respiratory distress, using accessory muscles, retractions, tripoding, nasal flaring, or cyanosis present.  Patient is speaking almost full sentences without needing to take a breath.  She is satting at 96% on room air with a respiratory rate of 20. Chest:     Chest wall: No tenderness.  Musculoskeletal:     Cervical back: Normal range of motion.  Skin:    General: Skin is warm and dry.  Neurological:     Mental Status: She is alert.    ED Results / Procedures / Treatments   Labs (all labs ordered are  listed, but only abnormal results are displayed) Labs Reviewed  BASIC METABOLIC PANEL - Abnormal; Notable for the following components:      Result Value   Potassium 5.4 (*)    Glucose, Bld 108 (*)    All other components within normal limits  RESP PANEL BY RT-PCR (FLU A&B, COVID) ARPGX2  CBC    EKG EKG Interpretation  Date/Time:  Monday June 17 2021 10:47:46 EST Ventricular Rate:  96 PR Interval:  102 QRS Duration: 116 QT Interval:  382 QTC Calculation: 482 R Axis:   88 Text Interpretation: Sinus rhythm with short PR Incomplete right bundle branch block Nonspecific ST and T wave abnormality No previous ECGs available Confirmed by Sherwood Gambler [phone]) on 06/17/2021 11:55:50 AM  Radiology DG Chest 2 View  Result Date: 06/17/2021 CLINICAL DATA:  Shortness of breath EXAM: CHEST - 2 VIEW COMPARISON:  06/24/2018 FINDINGS: Atherosclerotic calcification of the aortic arch. Thoracic spondylosis and degenerative disc disease. 1.3 cm density medially at the right lung apex, possibly from superimposition of osseous shadows, cannot exclude pulmonary nodule. The lungs appear otherwise clear. No blunting of the costophrenic angles. IMPRESSION: 1. Questionable nodule medially at the right lung apex. Chest CT is recommended for definitive characterization. 2.  Aortic Atherosclerosis (ICD10-I70.0). 3. Thoracic spondylosis and degenerative disc disease. Electronically Signed   By: Van Clines M.D.   On: 06/17/2021 11:51   CT Chest W Contrast  Result Date: 06/17/2021 CLINICAL DATA:  Possible nodule at the medial right lung apex on recent chest radiographs. EXAM: CT CHEST WITH CONTRAST TECHNIQUE: Multidetector CT imaging of the chest was performed during intravenous contrast administration. RADIATION DOSE REDUCTION: This exam was performed according to the departmental dose-optimization program which includes automated exposure control, adjustment of the mA and/or kV according to patient size  and/or use of iterative reconstruction technique. CONTRAST:  44mL OMNIPAQUE IOHEXOL 300 MG/ML  SOLN COMPARISON:  Chest radiographs dated 06/17/2021 and 06/24/2018. FINDINGS: Cardiovascular: Mild aortic atheromatous calcification. Normal sized heart. Mediastinum/Nodes: No enlarged mediastinal, hilar, or axillary lymph nodes. Thyroid gland, trachea, and esophagus demonstrate no significant findings. Lungs/Pleura: Minimal atelectasis or scarring at the medial right lung base. Otherwise, clear lungs. No lung nodules are seen. No pleural fluid. No pleural effusion or pneumothorax. Upper Abdomen: 1.5 cm right lobe liver cyst. Musculoskeletal: Extensive thoracic spine degenerative changes. IMPRESSION: 1. No acute abnormality. 2. No lung nodules seen. The recently suspected nodule at the medial right lung apex was due to bony overlap. Aortic Atherosclerosis (ICD10-I70.0). Electronically Signed   By: Claudie Revering M.D.   On: 06/17/2021 14:41    Procedures Procedures   Medications Ordered in ED Medications  AeroChamber Plus Flo-Vu Medium MISC 1 each (has no administration in time range)  albuterol (PROVENTIL) (2.5 MG/3ML) 0.083% nebulizer solution 2.5 mg (has no administration  in time range)  albuterol (VENTOLIN HFA) 108 (90 Base) MCG/ACT inhaler 6 puff (6 puffs Inhalation Given 06/17/21 1116)  methylPREDNISolone sodium succinate (SOLU-MEDROL) 125 mg/2 mL injection 125 mg (125 mg Intravenous Given 06/17/21 1327)  magnesium sulfate IVPB 2 g 50 mL (0 g Intravenous Stopped 06/17/21 1428)  ipratropium-albuterol (DUONEB) 0.5-2.5 (3) MG/3ML nebulizer solution 3 mL (3 mLs Nebulization Given 06/17/21 1517)  iohexol (OMNIPAQUE) 300 MG/ML solution 75 mL (75 mLs Intravenous Contrast Given 06/17/21 1429)    ED Course/ Medical Decision Making/ A&P                           Medical Decision Making Amount and/or Complexity of Data Reviewed Labs: ordered. Radiology: ordered.  Risk Prescription drug management.   76 y/o  F presents to the ED for eval of shortness of breath and wheezing for the past 3 days.  Differential diagnosis includes but not limited to pneumonia, asthma exacerbation, COVID, flu, viral illness, ACS.  Low suspicion of ACS as patient is not complaining of any chest pain and she has audible wheezing.  Vital signs show patient saturating at 96% on room air.  Mildly tachypneic around respiratory rate of 22.  She is hypertensive although this is a known problem for her.  Afebrile.  Physical exam pertinent for coarse expiratory and inspiratory wheezing heard throughout all lung fields.  Audible wheezing in the room.  No retractions, respiratory distress, tripoding, sensory muscle use, nasal flaring, or cyanosis present.  Patient is speaking almost full sentences without any intake of breath, but she is well-appearing.  Will order chest x-ray and basic labs.  I independently reviewed and interpreted the patient's labs and imaging.  BMP shows hyperkalemia at 5.4 although specimen says it is been hemolyzed.  Mildly elevated glucose at 108.  No other electrolyte abnormalities.  CBC shows no signs leukocytosis or anemia. I agree with the radiologist's interpretation of the CXR showing questionable nodule medially at the right lung apex with recommendation for Chest CT. CT ordered and no acute abnormality. No lung nodules seen. The recently suspected nodule at the medial right lung apex was due to bony overlap. Negative for COVID and flu.  Given benign CT, this is less likely pneumonia. Negative for COVID and flu. More likely asthma exacerbation.  Medications given: solumedrol 125mg , magnesium 2g, duonebs, and albuterol.  On re-evaluation, the patient reports she is feeling better, but still has audible wheezing. Will hand off patient at this time.   3:50 PM Care of Vielka J Masterson  transferred to Dr. Nile Riggs at the end of my shift as the patient will require reassessment once labs/imaging have resulted. Patient  presentation, ED course, and plan of care discussed with review of all pertinent labs and imaging. Please see his/her note for further details regarding further ED course and disposition. Plan at time of handoff is continue to monitor the patient with high likelihood of needing additional breathing treatments. This may be altered or completely changed at the discretion of the oncoming team pending results of further workup.  Final Clinical Impression(s) / ED Diagnoses Final diagnoses:  None    Rx / DC Orders ED Discharge Orders     None         Sherrell Puller, PA-C 06/17/21 1553    Sherwood Gambler, MD 06/18/21 2145

## 2021-06-18 ENCOUNTER — Telehealth: Payer: Self-pay

## 2021-06-18 NOTE — Telephone Encounter (Signed)
Spoke with pt after visit to the ED. She reports " doing fine & better." Pt does currently see asthma specialist Dr Terence Lux.

## 2021-06-19 ENCOUNTER — Telehealth: Payer: Self-pay

## 2021-06-19 ENCOUNTER — Ambulatory Visit (INDEPENDENT_AMBULATORY_CARE_PROVIDER_SITE_OTHER): Payer: Medicare HMO

## 2021-06-19 VITALS — Ht <= 58 in | Wt 147.0 lb

## 2021-06-19 DIAGNOSIS — Z0000 Encounter for general adult medical examination without abnormal findings: Secondary | ICD-10-CM | POA: Diagnosis not present

## 2021-06-19 NOTE — Patient Instructions (Signed)
Linda Moreno , Thank you for taking time to come for your Medicare Wellness Visit. I appreciate your ongoing commitment to your health goals. Please review the following plan we discussed and let me know if I can assist you in the future.   Screening recommendations/referrals: Colonoscopy: completed 06/25/2020, due 06/26/2023 Mammogram: completed 06/27/2020 Bone Density: completed 11/14/2019 Recommended yearly ophthalmology/optometry visit for glaucoma screening and checkup Recommended yearly dental visit for hygiene and checkup  Vaccinations: Influenza vaccine: completed 03/20/2021 Pneumococcal vaccine: completed 12/21/2019 Tdap vaccine: completed 02/15/2018, due 02/16/2028 Shingles vaccine: completed   Covid-19: 12/21/2019, 08/13/2019, 07/09/2019  Advanced directives: Advance directive discussed with you today.   Conditions/risks identified: none  Next appointment: Follow up in one year for your annual wellness visit    Preventive Care 65 Years and Older, Female Preventive care refers to lifestyle choices and visits with your health care provider that can promote health and wellness. What does preventive care include? A yearly physical exam. This is also called an annual well check. Dental exams once or twice a year. Routine eye exams. Ask your health care provider how often you should have your eyes checked. Personal lifestyle choices, including: Daily care of your teeth and gums. Regular physical activity. Eating a healthy diet. Avoiding tobacco and drug use. Limiting alcohol use. Practicing safe sex. Taking low-dose aspirin every day. Taking vitamin and mineral supplements as recommended by your health care provider. What happens during an annual well check? The services and screenings done by your health care provider during your annual well check will depend on your age, overall health, lifestyle risk factors, and family history of disease. Counseling  Your health care provider may  ask you questions about your: Alcohol use. Tobacco use. Drug use. Emotional well-being. Home and relationship well-being. Sexual activity. Eating habits. History of falls. Memory and ability to understand (cognition). Work and work Statistician. Reproductive health. Screening  You may have the following tests or measurements: Height, weight, and BMI. Blood pressure. Lipid and cholesterol levels. These may be checked every 5 years, or more frequently if you are over 77 years old. Skin check. Lung cancer screening. You may have this screening every year starting at age 77 if you have a 30-pack-year history of smoking and currently smoke or have quit within the past 15 years. Fecal occult blood test (FOBT) of the stool. You may have this test every year starting at age 77. Flexible sigmoidoscopy or colonoscopy. You may have a sigmoidoscopy every 5 years or a colonoscopy every 10 years starting at age 77. Hepatitis C blood test. Hepatitis B blood test. Sexually transmitted disease (STD) testing. Diabetes screening. This is done by checking your blood sugar (glucose) after you have not eaten for a while (fasting). You may have this done every 1-3 years. Bone density scan. This is done to screen for osteoporosis. You may have this done starting at age 77. Mammogram. This may be done every 1-2 years. Talk to your health care provider about how often you should have regular mammograms. Talk with your health care provider about your test results, treatment options, and if necessary, the need for more tests. Vaccines  Your health care provider may recommend certain vaccines, such as: Influenza vaccine. This is recommended every year. Tetanus, diphtheria, and acellular pertussis (Tdap, Td) vaccine. You may need a Td booster every 10 years. Zoster vaccine. You may need this after age 77. Pneumococcal 13-valent conjugate (PCV13) vaccine. One dose is recommended after age 77. Pneumococcal  polysaccharide (PPSV23)  vaccine. One dose is recommended after age 77. Talk to your health care provider about which screenings and vaccines you need and how often you need them. This information is not intended to replace advice given to you by your health care provider. Make sure you discuss any questions you have with your health care provider. Document Released: 06/08/2015 Document Revised: 01/30/2016 Document Reviewed: 03/13/2015 Elsevier Interactive Patient Education  2017 [place] Prevention in the Home Falls can cause injuries. They can happen to people of all ages. There are many things you can do to make your home safe and to help prevent falls. What can I do on the outside of my home? Regularly fix the edges of walkways and driveways and fix any cracks. Remove anything that might make you trip as you walk through a door, such as a raised step or threshold. Trim any bushes or trees on the path to your home. Use bright outdoor lighting. Clear any walking paths of anything that might make someone trip, such as rocks or tools. Regularly check to see if handrails are loose or broken. Make sure that both sides of any steps have handrails. Any raised decks and porches should have guardrails on the edges. Have any leaves, snow, or ice cleared regularly. Use sand or salt on walking paths during winter. Clean up any spills in your garage right away. This includes oil or grease spills. What can I do in the bathroom? Use night lights. Install grab bars by the toilet and in the tub and shower. Do not use towel bars as grab bars. Use non-skid mats or decals in the tub or shower. If you need to sit down in the shower, use a plastic, non-slip stool. Keep the floor dry. Clean up any water that spills on the floor as soon as it happens. Remove soap buildup in the tub or shower regularly. Attach bath mats securely with double-sided non-slip rug tape. Do not have throw rugs and other  things on the floor that can make you trip. What can I do in the bedroom? Use night lights. Make sure that you have a light by your bed that is easy to reach. Do not use any sheets or blankets that are too big for your bed. They should not hang down onto the floor. Have a firm chair that has side arms. You can use this for support while you get dressed. Do not have throw rugs and other things on the floor that can make you trip. What can I do in the kitchen? Clean up any spills right away. Avoid walking on wet floors. Keep items that you use a lot in easy-to-reach places. If you need to reach something above you, use a strong step stool that has a grab bar. Keep electrical cords out of the way. Do not use floor polish or wax that makes floors slippery. If you must use wax, use non-skid floor wax. Do not have throw rugs and other things on the floor that can make you trip. What can I do with my stairs? Do not leave any items on the stairs. Make sure that there are handrails on both sides of the stairs and use them. Fix handrails that are broken or loose. Make sure that handrails are as long as the stairways. Check any carpeting to make sure that it is firmly attached to the stairs. Fix any carpet that is loose or worn. Avoid having throw rugs at the top or bottom of  the stairs. If you do have throw rugs, attach them to the floor with carpet tape. Make sure that you have a light switch at the top of the stairs and the bottom of the stairs. If you do not have them, ask someone to add them for you. What else can I do to help prevent falls? Wear shoes that: Do not have high heels. Have rubber bottoms. Are comfortable and fit you well. Are closed at the toe. Do not wear sandals. If you use a stepladder: Make sure that it is fully opened. Do not climb a closed stepladder. Make sure that both sides of the stepladder are locked into place. Ask someone to hold it for you, if possible. Clearly  mark and make sure that you can see: Any grab bars or handrails. First and last steps. Where the edge of each step is. Use tools that help you move around (mobility aids) if they are needed. These include: Canes. Walkers. Scooters. Crutches. Turn on the lights when you go into a dark area. Replace any light bulbs as soon as they burn out. Set up your furniture so you have a clear path. Avoid moving your furniture around. If any of your floors are uneven, fix them. If there are any pets around you, be aware of where they are. Review your medicines with your doctor. Some medicines can make you feel dizzy. This can increase your chance of falling. Ask your doctor what other things that you can do to help prevent falls. This information is not intended to replace advice given to you by your health care provider. Make sure you discuss any questions you have with your health care provider. Document Released: 03/08/2009 Document Revised: 10/18/2015 Document Reviewed: 06/16/2014 Elsevier Interactive Patient Education  2017 [employer].

## 2021-06-19 NOTE — Progress Notes (Signed)
I connected with Islah Digangi today by telephone and verified that I am speaking with the correct person using two identifiers. Location patient: home Location provider: work Persons participating in the virtual visit: Dalton, Glenna Durand LPN.   I discussed the limitations, risks, security and privacy concerns of performing an evaluation and management service by telephone and the availability of in person appointments. I also discussed with the patient that there may be a patient responsible charge related to this service. The patient expressed understanding and verbally consented to this telephonic visit.    Interactive audio and video telecommunications were attempted between this provider and patient, however failed, due to patient having technical difficulties OR patient did not have access to video capability.  We continued and completed visit with audio only.     Vital signs may be patient reported or missing.  Subjective:   Siddalee Vanderheiden Waid is a 76 y.o. female who presents for Medicare Annual (Subsequent) preventive examination.  Review of Systems     Cardiac Risk Factors include: advanced age (>19men, >29 women);dyslipidemia;hypertension;obesity (BMI >30kg/m2)     Objective:    Today's Vitals   06/19/21 1116  Weight: 147 lb (66.7 kg)  Height: 4\' 9"  (1.448 m)   Body mass index is 31.81 kg/m.  Advanced Directives 06/19/2021 06/17/2021 10/11/2020 07/07/2019 04/06/2019 04/01/2018  Does Patient Have a Medical Advance Directive? No No No Yes No No  Type of Advance Directive - - Public librarian;Living will - -  Copy of [place] in Chart? - - - No - copy requested - -  Would patient like information on creating a medical advance directive? - No - Patient declined - - - No - Patient declined    Current Medications (verified) Outpatient Encounter Medications as of 06/19/2021  Medication Sig   albuterol (PROVENTIL HFA;VENTOLIN HFA) 108 (90 Base)  MCG/ACT inhaler Inhale 2 puffs into the lungs every 4 (four) hours as needed for wheezing or shortness of breath.   amLODipine (NORVASC) 10 MG tablet TAKE 1 TABLET EVERY DAY   atorvastatin (LIPITOR) 10 MG tablet TAKE 1 TABLET EVERY DAY   calcium citrate (CALCITRATE - DOSED IN MG ELEMENTAL CALCIUM) 950 MG tablet Take 200 mg of elemental calcium by mouth daily.   cetirizine (ZYRTEC ALLERGY) 10 MG tablet Take 1 tablet (10 mg total) by mouth daily.   fluticasone furoate-vilanterol (BREO ELLIPTA) 100-25 MCG/INH AEPB Inhale 1 puff into the lungs daily.   ibandronate (BONIVA) 150 MG tablet TAKE 1 TABLET ORALLY ONCE MONTHLY WITH 8-10 OZ WATER. SIT UPRIGHT AND NO FOOD/DRINK FOR 1 HOUR.   montelukast (SINGULAIR) 10 MG tablet Take 1 tablet (10 mg total) by mouth daily.   multivitamin-iron-minerals-folic acid (CENTRUM) chewable tablet Chew 1 tablet by mouth daily.   No facility-administered encounter medications on file as of 06/19/2021.    Allergies (verified) Shellfish allergy   History: Past Medical History:  Diagnosis Date   Asthma    History reviewed. No pertinent surgical history. Family History  Problem Relation Age of Onset   Diabetes Father    Social History   Socioeconomic History   Marital status: Married    Spouse name: Not on file   Number of children: Not on file   Years of education: Not on file   Highest education level: Not on file  Occupational History   Occupation: retired  Tobacco Use   Smoking status: Never   Smokeless tobacco: Never  Media planner  Vaping Use: Never used  Substance and Sexual Activity   Alcohol use: No   Drug use: No   Sexual activity: Yes  Other Topics Concern   Not on file  Social History Narrative   Not on file   Social Determinants of Health   Financial Resource Strain: Low Risk    Difficulty of Paying Living Expenses: Not hard at all  Food Insecurity: No Food Insecurity   Worried About Charity fundraiser in the Last Year: Never true    [place] in the Last Year: Never true  Transportation Needs: No Transportation Needs   Lack of Transportation (Medical): No   Lack of Transportation (Non-Medical): No  Physical Activity: Inactive   Days of Exercise per Week: 0 days   Minutes of Exercise per Session: 0 min  Stress: No Stress Concern Present   Feeling of Stress : Not at all  Social Connections: Not on file    Tobacco Counseling Counseling given: Not Answered   Clinical Intake:  Pre-visit preparation completed: Yes  Pain : No/denies pain     Nutritional Status: BMI > 30  Obese Nutritional Risks: None Diabetes: No  How often do you need to have someone help you when you read instructions, pamphlets, or other written materials from your doctor or pharmacy?: 1 - Never What is the last grade level you completed in school?: 12th grade  Diabetic? no  Interpreter Needed?: No  Information entered by :: NAllen LPN   Activities of Daily Living In your present state of health, do you have any difficulty performing the following activities: 06/19/2021 10/11/2020  Hearing? N N  Vision? N N  Difficulty concentrating or making decisions? N N  Walking or climbing stairs? N N  Dressing or bathing? N N  Doing errands, shopping? N N  Preparing Food and eating ? N N  Using the Toilet? N N  In the past six months, have you accidently leaked urine? Y N  Comment with coughing -  Do you have problems with loss of bowel control? N N  Managing your Medications? N N  Managing your Finances? N N  Housekeeping or managing your Housekeeping? N N  Some recent data might be hidden    Patient Care Team: [name], FNP as PCP - General (General Practice)  Indicate any recent Medical Services you may have received from other than [facility] providers in the past year (date may be approximate).     Assessment:   This is a routine wellness examination for Taneia.  Hearing/Vision screen Vision Screening - Comments:: No  regular eye exams,  Dietary issues and exercise activities discussed: Current Exercise Habits: The patient does not participate in regular exercise at present   Goals Addressed             This Visit's Progress    Patient Stated       06/19/2021, wants to weigh 130 pounds       Depression Screen PHQ 2/9 Scores 06/19/2021 10/11/2020 10/08/2020 07/07/2019 07/07/2019 04/06/2019 03/08/2019  PHQ - 2 Score 0 0 0 0 0 0 0  PHQ- 9 Score - - - 0 - - -    Fall Risk Fall Risk  06/19/2021 10/11/2020 10/08/2020 07/07/2019 07/07/2019  Falls in the past year? 0 0 0 0 0  Number falls in past yr: - - - - -  Risk for fall due to : Medication side effect Medication side effect - Medication side effect -  Follow up Falls evaluation completed;Education provided;Falls prevention discussed Falls evaluation completed;Education provided;Falls prevention discussed - Falls evaluation completed;Education provided;Falls prevention discussed -    FALL RISK PREVENTION PERTAINING TO THE HOME:  Any stairs in or around the home? Yes  If so, are there any without handrails? No  Home free of loose throw rugs in walkways, pet beds, electrical cords, etc? Yes  Adequate lighting in your home to reduce risk of falls? Yes   ASSISTIVE DEVICES UTILIZED TO PREVENT FALLS:  Life alert? No  Use of a cane, walker or w/c? No  Grab bars in the bathroom? No  Shower chair or bench in shower? No  Elevated toilet seat or a handicapped toilet? Yes   TIMED UP AND GO:  Was the test performed? No .      Cognitive Function:     6CIT Screen 06/19/2021 10/11/2020 07/07/2019 04/06/2019 04/01/2018  What Year? 0 points 0 points 0 points 0 points 0 points  What month? 0 points 0 points 0 points 0 points 0 points  What time? 0 points 0 points 0 points 0 points 0 points  Count back from 20 0 points 0 points 0 points 0 points 0 points  Months in reverse 0 points 2 points 0 points 0 points 0 points  Repeat phrase 0 points 0 points 2  points 8 points 2 points  Total Score 0 2 2 8 2     Immunizations Immunization History  Administered Date(s) Administered   Influenza, High Dose Seasonal PF 05/18/2015, 04/21/2016, 06/05/2017, 02/15/2018, 06/14/2018, 01/07/2019, 03/12/2020   Influenza-Unspecified 01/07/2019, 03/27/2020, 03/20/2021   PFIZER(Purple Top)SARS-COV-2 Vaccination 07/09/2019, 08/13/2019, 12/21/2019   Pneumococcal Polysaccharide-23 06/05/2017, 06/14/2018, 12/21/2019   Tdap 02/15/2018   Zoster Recombinat (Shingrix) 08/12/2017, 12/28/2017    TDAP status: Up to date  Flu Vaccine status: Up to date  Pneumococcal vaccine status: Up to date  Covid-19 vaccine status: Completed vaccines  Qualifies for Shingles Vaccine? Yes   Zostavax completed No   Shingrix Completed?: Yes  Screening Tests Health Maintenance  Topic Date Due   COVID-19 Vaccine (4 - Booster for Pfizer series) 02/15/2020   Pneumonia Vaccine 7+ Years old (3 - PCV) 12/20/2020   DEXA SCAN  11/13/2021   COLONOSCOPY (Pts 45-38yrs Insurance coverage will need to be confirmed)  06/25/2025   TETANUS/TDAP  02/16/2028   INFLUENZA VACCINE  Completed   Hepatitis C Screening  Completed   Zoster Vaccines- Shingrix  Completed   HPV VACCINES  Aged Out    Health Maintenance  Health Maintenance Due  Topic Date Due   COVID-19 Vaccine (4 - Booster for Pfizer series) 02/15/2020   Pneumonia Vaccine 7+ Years old (3 - PCV) 12/20/2020    Colorectal cancer screening: Type of screening: Colonoscopy. Completed 1/31/202. Repeat every 3 years  Mammogram status: Completed 06/27/2020. Repeat every year  Bone Density status: Completed 11/14/2019.   Lung Cancer Screening: (Low Dose CT Chest recommended if Age 84-80 years, 30 pack-year currently smoking OR have quit w/in 15years.) does not qualify.   Lung Cancer Screening Referral: no  Additional Screening:  Hepatitis C Screening: does qualify; Completed 01/10/2019  Vision Screening: Recommended annual  ophthalmology exams for early detection of glaucoma and other disorders of the eye. Is the patient up to date with their annual eye exam?  No  Who is the provider or what is the name of the office in which the patient attends annual eye exams? none If pt is not established with a provider, would they like  to be referred to a provider to establish care? No .   Dental Screening: Recommended annual dental exams for proper oral hygiene  Community Resource Referral / Chronic Care Management: CRR required this visit?  No   CCM required this visit?  No      Plan:     I have personally reviewed and noted the following in the patients chart:   Medical and social history Use of alcohol, tobacco or illicit drugs  Current medications and supplements including opioid prescriptions.  Functional ability and status Nutritional status Physical activity Advanced directives List of other physicians Hospitalizations, surgeries, and ER visits in previous 12 months Vitals Screenings to include cognitive, depression, and falls Referrals and appointments  In addition, I have reviewed and discussed with patient certain preventive protocols, quality metrics, and best practice recommendations. A written personalized care plan for preventive services as well as general preventive health recommendations were provided to patient.     Kellie Simmering, LPN   8/92/1194   Nurse Notes: none

## 2021-06-19 NOTE — Telephone Encounter (Signed)
Pt aware. She needs to schedule ED f/u with asthma specialist. Since that is what she was admitted for. Pt notified.

## 2021-06-22 ENCOUNTER — Emergency Department (HOSPITAL_COMMUNITY): Payer: Medicare HMO

## 2021-06-22 ENCOUNTER — Observation Stay (HOSPITAL_COMMUNITY)
Admission: EM | Admit: 2021-06-22 | Discharge: 2021-06-23 | Disposition: A | Discharge status: Home / Self Care | Payer: Medicare HMO | Attending: Internal Medicine | Admitting: Internal Medicine

## 2021-06-22 ENCOUNTER — Other Ambulatory Visit: Payer: Self-pay

## 2021-06-22 ENCOUNTER — Encounter (HOSPITAL_COMMUNITY): Payer: Self-pay

## 2021-06-22 DIAGNOSIS — K7689 Other specified diseases of liver: Secondary | ICD-10-CM | POA: Diagnosis not present

## 2021-06-22 DIAGNOSIS — Z79899 Other long term (current) drug therapy: Secondary | ICD-10-CM | POA: Insufficient documentation

## 2021-06-22 DIAGNOSIS — Z9109 Other allergy status, other than to drugs and biological substances: Secondary | ICD-10-CM | POA: Diagnosis present

## 2021-06-22 DIAGNOSIS — I10 Essential (primary) hypertension: Secondary | ICD-10-CM | POA: Insufficient documentation

## 2021-06-22 DIAGNOSIS — J4530 Mild persistent asthma, uncomplicated: Secondary | ICD-10-CM

## 2021-06-22 DIAGNOSIS — J45901 Unspecified asthma with (acute) exacerbation: Secondary | ICD-10-CM | POA: Diagnosis present

## 2021-06-22 DIAGNOSIS — J9601 Acute respiratory failure with hypoxia: Secondary | ICD-10-CM | POA: Diagnosis not present

## 2021-06-22 DIAGNOSIS — R030 Elevated blood-pressure reading, without diagnosis of hypertension: Secondary | ICD-10-CM

## 2021-06-22 DIAGNOSIS — J4541 Moderate persistent asthma with (acute) exacerbation: Secondary | ICD-10-CM | POA: Diagnosis not present

## 2021-06-22 DIAGNOSIS — Z20822 Contact with and (suspected) exposure to covid-19: Secondary | ICD-10-CM | POA: Insufficient documentation

## 2021-06-22 DIAGNOSIS — E782 Mixed hyperlipidemia: Secondary | ICD-10-CM | POA: Diagnosis present

## 2021-06-22 DIAGNOSIS — R051 Acute cough: Secondary | ICD-10-CM | POA: Diagnosis present

## 2021-06-22 DIAGNOSIS — I119 Hypertensive heart disease without heart failure: Secondary | ICD-10-CM | POA: Diagnosis present

## 2021-06-22 DIAGNOSIS — Z7951 Long term (current) use of inhaled steroids: Secondary | ICD-10-CM | POA: Diagnosis not present

## 2021-06-22 DIAGNOSIS — R0602 Shortness of breath: Secondary | ICD-10-CM | POA: Diagnosis not present

## 2021-06-22 HISTORY — DX: Mild persistent asthma, uncomplicated: J45.30

## 2021-06-22 HISTORY — DX: Unspecified asthma with (acute) exacerbation: J45.901

## 2021-06-22 LAB — CBC WITH DIFFERENTIAL/PLATELET
Abs Immature Granulocytes: 0.01 10*3/uL (ref 0.00–0.07)
Basophils Absolute: 0.1 10*3/uL (ref 0.0–0.1)
Basophils Relative: 1 %
Eosinophils Absolute: 1.1 10*3/uL — ABNORMAL HIGH (ref 0.0–0.5)
Eosinophils Relative: 14 %
HCT: 43.1 % (ref 36.0–46.0)
Hemoglobin: 14.9 g/dL (ref 12.0–15.0)
Immature Granulocytes: 0 %
Lymphocytes Relative: 26 %
Lymphs Abs: 2.1 10*3/uL (ref 0.7–4.0)
MCH: 31 pg (ref 26.0–34.0)
MCHC: 34.6 g/dL (ref 30.0–36.0)
MCV: 89.6 fL (ref 80.0–100.0)
Monocytes Absolute: 0.8 10*3/uL (ref 0.1–1.0)
Monocytes Relative: 10 %
Neutro Abs: 4.1 10*3/uL (ref 1.7–7.7)
Neutrophils Relative %: 49 %
Platelets: 291 10*3/uL (ref 150–400)
RBC: 4.81 MIL/uL (ref 3.87–5.11)
RDW: 12 % (ref 11.5–15.5)
WBC: 8.2 10*3/uL (ref 4.0–10.5)
nRBC: 0 % (ref 0.0–0.2)

## 2021-06-22 LAB — BASIC METABOLIC PANEL
Anion gap: 9 (ref 5–15)
BUN: 12 mg/dL (ref 8–23)
CO2: 25 mmol/L (ref 22–32)
Calcium: 9.5 mg/dL (ref 8.9–10.3)
Chloride: 103 mmol/L (ref 98–111)
Creatinine, Ser: 0.73 mg/dL (ref 0.44–1.00)
GFR, Estimated: >60 mL/min (ref >60)
Glucose, Bld: 141 mg/dL — ABNORMAL HIGH (ref 70–99)
Potassium: 3.6 mmol/L (ref 3.5–5.1)
Sodium: 137 mmol/L (ref 135–145)

## 2021-06-22 LAB — RESP PANEL BY RT-PCR (FLU A&B, COVID) ARPGX2
Influenza A by PCR: NEGATIVE
Influenza B by PCR: NEGATIVE
SARS Coronavirus 2 by RT PCR: NEGATIVE

## 2021-06-22 MED ORDER — IPRATROPIUM BROMIDE 0.02 % IN SOLN: 0.5000 mg | RESPIRATORY_TRACT | Status: DC | PRN

## 2021-06-22 MED ORDER — ACETAMINOPHEN 325 MG PO TABS: 650.0000 mg | ORAL_TABLET | Freq: Four times a day (QID) | ORAL | Status: DC | PRN

## 2021-06-22 MED ORDER — ACETAMINOPHEN 650 MG RE SUPP: 650.0000 mg | Freq: Four times a day (QID) | RECTAL | Status: DC | PRN

## 2021-06-22 MED ORDER — ENOXAPARIN SODIUM 40 MG/0.4ML IJ SOSY
40.0000 mg | PREFILLED_SYRINGE | INTRAMUSCULAR | Status: DC
  Administered 2021-06-23: 40 mg via SUBCUTANEOUS
  Filled 2021-06-22: qty 0.4

## 2021-06-22 MED ORDER — ALBUTEROL SULFATE (2.5 MG/3ML) 0.083% IN NEBU: 5.0000 mg | INHALATION_SOLUTION | RESPIRATORY_TRACT | Status: DC | PRN

## 2021-06-22 MED ORDER — PREDNISONE 20 MG PO TABS
50.0000 mg | ORAL_TABLET | Freq: Every day | ORAL | Status: DC
  Administered 2021-06-23: 50 mg via ORAL
  Filled 2021-06-22: qty 1

## 2021-06-22 MED ORDER — MONTELUKAST SODIUM 10 MG PO TABS
10.0000 mg | ORAL_TABLET | Freq: Every day | ORAL | Status: DC
Start: 2021-06-23 — End: 2021-06-23
  Administered 2021-06-23: 10 mg via ORAL
  Filled 2021-06-22 (×2): qty 1

## 2021-06-22 MED ORDER — IPRATROPIUM-ALBUTEROL 0.5-2.5 (3) MG/3ML IN SOLN
3.0000 mL | Freq: Four times a day (QID) | RESPIRATORY_TRACT | Status: DC
  Administered 2021-06-23 (×2): 3 mL via RESPIRATORY_TRACT
  Filled 2021-06-22 (×2): qty 3

## 2021-06-22 MED ORDER — SODIUM CHLORIDE 0.9% FLUSH
3.0000 mL | Freq: Two times a day (BID) | INTRAVENOUS | Status: DC
  Administered 2021-06-23 (×2): 3 mL via INTRAVENOUS

## 2021-06-22 MED ORDER — FLUTICASONE FUROATE-VILANTEROL 100-25 MCG/ACT IN AEPB
1.0000 | INHALATION_SPRAY | Freq: Every day | RESPIRATORY_TRACT | Status: DC
  Filled 2021-06-22 (×2): qty 28

## 2021-06-22 MED ORDER — EPINEPHRINE 0.3 MG/0.3ML IJ SOAJ
0.3000 mg | Freq: Once | INTRAMUSCULAR | Status: DC | PRN
  Filled 2021-06-22: qty 0.6

## 2021-06-22 MED ORDER — ALBUTEROL SULFATE (2.5 MG/3ML) 0.083% IN NEBU
10.0000 mg | INHALATION_SOLUTION | Freq: Once | RESPIRATORY_TRACT | Status: AC
  Administered 2021-06-22: 10 mg via RESPIRATORY_TRACT
  Filled 2021-06-22: qty 12

## 2021-06-22 MED ORDER — METHYLPREDNISOLONE SODIUM SUCC 125 MG IJ SOLR
125.0000 mg | Freq: Once | INTRAMUSCULAR | Status: AC
  Administered 2021-06-22: 125 mg via INTRAVENOUS
  Filled 2021-06-22: qty 2

## 2021-06-22 MED ORDER — ALBUTEROL SULFATE (2.5 MG/3ML) 0.083% IN NEBU: 2.5000 mg | INHALATION_SOLUTION | RESPIRATORY_TRACT | Status: DC | PRN

## 2021-06-22 MED ORDER — LORATADINE 10 MG PO TABS
10.0000 mg | ORAL_TABLET | Freq: Every day | ORAL | Status: DC
  Administered 2021-06-23: 10 mg via ORAL
  Filled 2021-06-22: qty 1

## 2021-06-22 MED ORDER — MAGNESIUM SULFATE 2 GM/50ML IV SOLN
2.0000 g | Freq: Once | INTRAVENOUS | Status: AC
  Administered 2021-06-22: 2 g via INTRAVENOUS
  Filled 2021-06-22: qty 50

## 2021-06-22 MED ORDER — ATORVASTATIN CALCIUM 10 MG PO TABS
10.0000 mg | ORAL_TABLET | Freq: Every day | ORAL | Status: DC
  Administered 2021-06-23: 10 mg via ORAL
  Filled 2021-06-22: qty 1

## 2021-06-22 MED ORDER — POLYETHYLENE GLYCOL 3350 17 G PO PACK: 17.0000 g | PACK | Freq: Every day | ORAL | Status: DC | PRN

## 2021-06-22 MED ORDER — ALBUTEROL SULFATE HFA 108 (90 BASE) MCG/ACT IN AERS: 2.0000 | INHALATION_SPRAY | RESPIRATORY_TRACT | Status: DC | PRN

## 2021-06-22 NOTE — ED Provider Notes (Signed)
[facility] EMERGENCY DEPARTMENT Provider Note   CSN: [id number] Arrival date & time: 06/22/21  2154     History  Chief Complaint  Patient presents with   Shortness of Breath    Linda Moreno is a 76 y.o. female.  The history is provided by the patient and medical records.  Shortness of Breath Associated symptoms: cough    76 year old female with history of asthma, hyperlipidemia, osteoporosis, presenting to the ED with shortness of breath.  Seen in the ED on Monday for same and was given multiple neb treatments with some improvement.  States she did not return to her full baseline, but was feeling better.  States over the past several days condition has worsened again, now to the point that she cannot sleep due to the SOB.  Has continued using her home proair and breo without change.  Called PCP for follow-up however he is out of town and could not be seen until next week.  Does report cough but no fevers/chills.  No new covid/flu exposures.  Home Medications Prior to Admission medications   Medication Sig Start Date End Date Taking? Authorizing Provider  albuterol (PROVENTIL HFA;VENTOLIN HFA) 108 (90 Base) MCG/ACT inhaler Inhale 2 puffs into the lungs every 4 (four) hours as needed for wheezing or shortness of breath.    [provider]  amLODipine (NORVASC) 10 MG tablet TAKE 1 TABLET EVERY DAY 06/03/21   [name], FNP  atorvastatin (LIPITOR) 10 MG tablet TAKE 1 TABLET EVERY DAY 05/30/21   [name], FNP  calcium citrate (CALCITRATE - DOSED IN MG ELEMENTAL CALCIUM) 950 MG tablet Take 200 mg of elemental calcium by mouth daily.    [provider]  cetirizine (ZYRTEC ALLERGY) 10 MG tablet Take 1 tablet (10 mg total) by mouth daily. 10/08/20   [name], FNP  fluticasone furoate-vilanterol (BREO ELLIPTA) 100-25 MCG/INH AEPB Inhale 1 puff into the lungs daily.    [provider]  ibandronate (BONIVA) 150 MG tablet TAKE 1 TABLET ORALLY  ONCE MONTHLY WITH 8-10 OZ WATER. SIT UPRIGHT AND NO FOOD/DRINK FOR 1 HOUR. 05/09/20   [name], FNP  montelukast (SINGULAIR) 10 MG tablet Take 1 tablet (10 mg total) by mouth daily. 10/08/20 10/08/21  [name], FNP  multivitamin-iron-minerals-folic acid (CENTRUM) chewable tablet Chew 1 tablet by mouth daily.    [provider]      Allergies    Shellfish allergy    Review of Systems   Review of Systems  Respiratory:  Positive for cough and shortness of breath.   All other systems reviewed and are negative.  Physical Exam Updated Vital Signs BP (!) 177/89 (BP Location: Right Arm)    Pulse (!) 117    Temp 98.3 F (36.8 C) (Oral)    Resp (!) 21    SpO2 97%   Physical Exam Vitals and nursing note reviewed.  Constitutional:      Appearance: She is well-developed.  HENT:     Head: Normocephalic and atraumatic.  Eyes:     Conjunctiva/sclera: Conjunctivae normal.     Pupils: Pupils are equal, round, and reactive to light.  Cardiovascular:     Rate and Rhythm: Normal rate and regular rhythm.     Heart sounds: Normal heart sounds.  Pulmonary:     Effort: Pulmonary effort is normal.     Breath sounds: Wheezing present.     Comments: Increased WOB, audible wheezing from bedside, inspiratory and expiratory wheezes throughout, O2 sats  86-88% on RA, increased to 96% with 2L Abdominal:     General: Bowel sounds are normal.     Palpations: Abdomen is soft.  Musculoskeletal:        General: Normal range of motion.     Cervical back: Normal range of motion.  Skin:    General: Skin is warm and dry.  Neurological:     Mental Status: She is alert and oriented to person, place, and time.    ED Results / Procedures / Treatments   Labs (all labs ordered are listed, but only abnormal results are displayed) Labs Reviewed  CBC WITH DIFFERENTIAL/PLATELET - Abnormal; Notable for the following components:      Result Value   Eosinophils Absolute 1.1 (*)    All other components  within normal limits  BASIC METABOLIC PANEL - Abnormal; Notable for the following components:   Glucose, Bld 141 (*)    All other components within normal limits  RESP PANEL BY RT-PCR (FLU A&B, COVID) ARPGX2  RESPIRATORY PANEL BY PCR  BASIC METABOLIC PANEL  CBC    EKG None  Radiology DG Chest Port 1 View  Result Date: 06/22/2021 CLINICAL DATA:  Shortness of breath EXAM: PORTABLE CHEST 1 VIEW COMPARISON:  06/17/2021 FINDINGS: Cardiac shadow is within normal limits. Aortic calcifications are again seen. Lungs are well aerated bilaterally. No focal infiltrate or sizable effusion is seen. Degenerative changes of the thoracic spine are noted. IMPRESSION: No acute abnormality noted. Electronically Signed   By: Inez Catalina M.D.   On: 06/22/2021 22:57    Procedures Procedures    Medications Ordered in ED Medications  atorvastatin (LIPITOR) tablet 10 mg (has no administration in time range)  loratadine (CLARITIN) tablet 10 mg (has no administration in time range)  fluticasone furoate-vilanterol (BREO ELLIPTA) 100-25 MCG/INH 1 puff (has no administration in time range)  montelukast (SINGULAIR) tablet 10 mg (has no administration in time range)  enoxaparin (LOVENOX) injection 40 mg (has no administration in time range)  sodium chloride flush (NS) 0.9 % injection 3 mL (has no administration in time range)  EPINEPHrine (EPI-PEN) injection 0.3 mg (has no administration in time range)  acetaminophen (TYLENOL) tablet 650 mg (has no administration in time range)    Or  acetaminophen (TYLENOL) suppository 650 mg (has no administration in time range)  polyethylene glycol (MIRALAX / GLYCOLAX) packet 17 g (has no administration in time range)  albuterol (PROVENTIL) (2.5 MG/3ML) 0.083% nebulizer solution 2.5 mg (has no administration in time range)  ipratropium-albuterol (DUONEB) 0.5-2.5 (3) MG/3ML nebulizer solution 3 mL (has no administration in time range)  predniSONE (DELTASONE) tablet 50 mg (has  no administration in time range)  magnesium sulfate IVPB 2 g 50 mL (0 g Intravenous Stopped 06/22/21 2337)  methylPREDNISolone sodium succinate (SOLU-MEDROL) 125 mg/2 mL injection 125 mg (125 mg Intravenous Given 06/22/21 2229)  albuterol (PROVENTIL) (2.5 MG/3ML) 0.083% nebulizer solution 10 mg (10 mg Nebulization Given 06/22/21 2224)    ED Course/ Medical Decision Making/ A&P                           Medical Decision Making Amount and/or Complexity of Data Reviewed External Data Reviewed: labs and radiology. Labs: ordered. Radiology: ordered and independent interpretation performed.  Risk Prescription drug management. Decision regarding hospitalization.   CRITICAL CARE Performed by: Larene Pickett   Total critical care time: 45 minutes  Critical care time was exclusive of separately billable procedures and treating  other patients.  Critical care was necessary to treat or prevent imminent or life-threatening deterioration.  Critical care was time spent personally by me on the following activities: development of treatment plan with patient and/or surrogate as well as nursing, discussions with consultants, evaluation of patient's response to treatment, examination of patient, obtaining history from patient or surrogate, ordering and performing treatments and interventions, ordering and review of laboratory studies, ordering and review of radiographic studies, pulse oximetry and re-evaluation of patient's condition.  76 year old female with history of asthma, presenting to the ED for asthma exacerbation.  Seen in the ED earlier this week and given multiple treatments with some mild improvement.  States throughout the past few days symptoms have acutely worsened again.  Hypoxic on arrival to 86% on room air.  She has audible wheezing from the bedside with increased work of breathing.  Will start on continuous neb, given Solu-Medrol and magnesium.  We will plan for labs and chest x-ray.   Anticipate she will need admission.  11:36 PM Has finished hour long neb, no real improvement.  Still has audible wheezing from bedside and is requiring supplemental O2.  At this point, she will require admission.  Patient is agreeable.  Will start Q20 nebs for now.  Discussed with hospitalist, patient will be admitted for further management.  Final Clinical Impression(s) / ED Diagnoses Final diagnoses:  Moderate persistent asthma with exacerbation    Rx / DC Orders ED Discharge Orders     None         Larene Pickett, PA-C 06/23/21 0003    Lucrezia Starch, MD 06/24/21 1324

## 2021-06-22 NOTE — H&P (Signed)
History and Physical   Linda Moreno PYP:[id number] DOB: [birth year] DOA: 06/22/2021  PCP: [name], FNP   Patient coming from: Home  Chief Complaint: Shortness of breath  HPI: Linda Moreno is a 76 y.o. female with medical history significant of asthma, hypertension, hyperlipidemia presenting with ongoing shortness of breath.  Patient was seen in the ED around 5 days ago with similar symptoms believed to be asthma exacerbation.  She was treated with multiple nebulizers with some improvement in her symptoms.  She was ultimately discharged home.  She states that over the past few days her symptoms have slowly been worsening again and is gone to the point where she can no longer sleep at night.  She has continued to take her home Breo and as needed albuterol without relief.  Reports shortness of breath as above and wheezing.  She denies fevers, chills, chest pain, abdominal pain, constipation, diarrhea, nausea, vomiting.  ED Course: Vital signs in the ED significant for blood pressure in the 245Y to 099I systolic and saturating 33% room air with improvement on 2 L.  Lab work-up showed BMP with glucose 141.  CBC within normal limits.  Respiratory panel for flu and COVID-negative.  Chest x-ray with no acute abnormality.  Patient received Solu-Medrol, magnesium, Atrovent, hour-long continuous albuterol without significant provement.  Review of Systems: As per HPI otherwise all other systems reviewed and are negative.  Past Medical History:  Diagnosis Date   Asthma     History reviewed. No pertinent surgical history.  Social History  reports that she has never smoked. She has never used smokeless tobacco. She reports that she does not drink alcohol and does not use drugs.  Allergies  Allergen Reactions   Shellfish Allergy     Family History  Problem Relation Age of Onset   Diabetes Father   Reviewed on admission  Prior to Admission medications   Medication Sig Start Date End Date  Taking? Authorizing Provider  albuterol (PROVENTIL HFA;VENTOLIN HFA) 108 (90 Base) MCG/ACT inhaler Inhale 2 puffs into the lungs every 4 (four) hours as needed for wheezing or shortness of breath.    [provider]  atorvastatin (LIPITOR) 10 MG tablet TAKE 1 TABLET EVERY DAY 05/30/21   [name], FNP  cetirizine (ZYRTEC ALLERGY) 10 MG tablet Take 1 tablet (10 mg total) by mouth daily. 10/08/20   [name], FNP  fluticasone furoate-vilanterol (BREO ELLIPTA) 100-25 MCG/INH AEPB Inhale 1 puff into the lungs daily.    [provider]  ibandronate (BONIVA) 150 MG tablet TAKE 1 TABLET ORALLY ONCE MONTHLY WITH 8-10 OZ WATER. SIT UPRIGHT AND NO FOOD/DRINK FOR 1 HOUR. 05/09/20   [name], FNP  montelukast (SINGULAIR) 10 MG tablet Take 1 tablet (10 mg total) by mouth daily. 10/08/20 10/08/21  [name], FNP  multivitamin-iron-minerals-folic acid (CENTRUM) chewable tablet Chew 1 tablet by mouth daily.    [provider]    Physical Exam: Vitals:   06/22/21 2300 06/22/21 2315 06/22/21 2330 06/22/21 2345  BP: (!) 141/62 134/77 (!) 131/99 128/75  Pulse: 98 88 88 90  Resp: 17 16 17 14   Temp:      TempSrc:      SpO2: 100% 100% 100% 100%   Physical Exam Constitutional:      General: She is not in acute distress.    Appearance: Normal appearance.  HENT:     Head: Normocephalic and atraumatic.     Mouth/Throat:     Mouth: Mucous membranes are  moist.     Pharynx: Oropharynx is clear.  Eyes:     Extraocular Movements: Extraocular movements intact.     Pupils: Pupils are equal, round, and reactive to light.  Cardiovascular:     Rate and Rhythm: Normal rate and regular rhythm.     Pulses: Normal pulses.     Heart sounds: Normal heart sounds.  Pulmonary:     Effort: Pulmonary effort is normal. No respiratory distress.     Breath sounds: Wheezing present.  Abdominal:     General: Bowel sounds are normal. There is no distension.     Palpations: Abdomen is soft.      Tenderness: There is no abdominal tenderness.  Musculoskeletal:        General: No swelling or deformity.  Skin:    General: Skin is warm and dry.  Neurological:     General: No focal deficit present.     Mental Status: Mental status is at baseline.     Labs on Admission: I have personally reviewed following labs and imaging studies  CBC: Recent Labs  Lab 06/17/21 1258 06/22/21 2209  WBC 6.7 8.2  NEUTROABS  --  4.1  HGB 14.6 14.9  HCT 44.0 43.1  MCV 89.4 89.6  PLT 285 196    Basic Metabolic Panel: Recent Labs  Lab 06/17/21 1258 06/22/21 2209  NA 138 137  K 5.4* 3.6  CL 101 103  CO2 25 25  GLUCOSE 108* 141*  BUN 11 12  CREATININE 0.73 0.73  CALCIUM 9.2 9.5    GFR: Estimated Creatinine Clearance: 46.3 mL/min (by C-G formula based on SCr of 0.73 mg/dL).  Liver Function Tests: No results for input(s): AST, ALT, ALKPHOS, BILITOT, PROT, ALBUMIN in the last 168 hours.  Urine analysis:    Component Value Date/Time   BILIRUBINUR Negative 04/11/2021 1240   PROTEINUR Negative 04/11/2021 1240   UROBILINOGEN 1.0 04/11/2021 1240   NITRITE Negative 04/11/2021 1240   LEUKOCYTESUR Negative 04/11/2021 1240    Radiological Exams on Admission: DG Chest Port 1 View  Result Date: 06/22/2021 CLINICAL DATA:  Shortness of breath EXAM: PORTABLE CHEST 1 VIEW COMPARISON:  06/17/2021 FINDINGS: Cardiac shadow is within normal limits. Aortic calcifications are again seen. Lungs are well aerated bilaterally. No focal infiltrate or sizable effusion is seen. Degenerative changes of the thoracic spine are noted. IMPRESSION: No acute abnormality noted. Electronically Signed   By: Inez Catalina M.D.   On: 06/22/2021 22:57    EKG: Independently reviewed.  Sinus tachycardia at 106 bpm.  Some baseline wander and minimal baseline artifact.  Right bundle branch block.  Similar to previous.  Assessment/Plan Principal Problem:   Asthma exacerbation Active Problems:   Essential  hypertension   Mixed hyperlipidemia   Environmental allergies   Acute respiratory failure with hypoxia (HCC)  Asthma exacerbation Acute respiratory failure with hypoxia > Presenting with ongoing shortness of breath with wheezing in the setting of known asthma.  Previously treated in the past week for asthma exacerbation in the ED with nebulizers with some improvement. > Symptoms have returned to her last few days not responding to home treatments and not responding to initial treatments in the ED which included Solu-Medrol, continuous albuterol, Atrovent. > Saturating 86% on room air, improved on 2 L. - Monitor on telemetry - Daily prednisone - Scheduled DuoNebs every 6 hours - As needed albuterol every 2 hours - Check RVP - Continue supplemental oxygen, wean as tolerated - Continue home Breo  Allergies - Continue  Zyrtec and Singulair  Hyperlipidemia - Continue home atorvastatin  Hypertension > Mild hypertension in the ED in the 482L 078M systolic.  Does not appear to be currently on any antihypertensives outpatient per chart review. - Continue to monitor  DVT prophylaxis: Lovenox  Code Status:   Full  Family Communication:  Son updated at bedside Disposition Plan:   Patient is from:  Home  Anticipated DC to:  Home  Anticipated DC date:  1 to 2 days  Anticipated DC barriers: None  Consults called:  None  Admission status:  Observation, telemetry   Severity of Illness: The appropriate patient status for this patient is OBSERVATION. Observation status is judged to be reasonable and necessary in order to provide the required intensity of service to ensure the patient's safety. The patient's presenting symptoms, physical exam findings, and initial radiographic and laboratory data in the context of their medical condition is felt to place them at decreased risk for further clinical deterioration. Furthermore, it is anticipated that the patient will be medically stable for discharge  from the hospital within 2 midnights of admission.    Marcelyn Bruins MD [facility]  How to contact the [facility] Attending or Consulting provider [place] or covering provider during after hours [place], for this patient?   Check the care team in [facility] and look for a) attending/consulting TRH provider listed and b) the [facility] team listed Log into [email or web address] and use 's universal password to access. If you do not have the password, please contact the hospital operator. Locate the [facility] provider you are looking for under [facility] and page to a number that you can be directly reached. If you still have difficulty reaching the provider, please page the [facility] (Director on Call) for the Hospitalists listed on amion for assistance.  06/23/2021, 12:10 AM

## 2021-06-22 NOTE — ED Triage Notes (Signed)
Pt c/o [facility], was seen for asthma exacerbation Monday & discharged. States still no relief from symptoms, unable to sleep. Audible wheezing during triage, O2 86%, pt placed on 2L [place]

## 2021-06-23 DIAGNOSIS — J9601 Acute respiratory failure with hypoxia: Secondary | ICD-10-CM

## 2021-06-23 DIAGNOSIS — J45901 Unspecified asthma with (acute) exacerbation: Secondary | ICD-10-CM | POA: Diagnosis not present

## 2021-06-23 DIAGNOSIS — E782 Mixed hyperlipidemia: Secondary | ICD-10-CM | POA: Diagnosis not present

## 2021-06-23 DIAGNOSIS — I10 Essential (primary) hypertension: Secondary | ICD-10-CM | POA: Diagnosis not present

## 2021-06-23 LAB — CBC
HCT: 42.1 % (ref 36.0–46.0)
Hemoglobin: 14 g/dL (ref 12.0–15.0)
MCH: 29.7 pg (ref 26.0–34.0)
MCHC: 33.3 g/dL (ref 30.0–36.0)
MCV: 89.4 fL (ref 80.0–100.0)
Platelets: 264 10*3/uL (ref 150–400)
RBC: 4.71 MIL/uL (ref 3.87–5.11)
RDW: 11.9 % (ref 11.5–15.5)
WBC: 7.1 10*3/uL (ref 4.0–10.5)
nRBC: 0 % (ref 0.0–0.2)

## 2021-06-23 LAB — RESPIRATORY PANEL BY PCR

## 2021-06-23 LAB — BASIC METABOLIC PANEL
Anion gap: 12 (ref 5–15)
BUN: 14 mg/dL (ref 8–23)
CO2: 23 mmol/L (ref 22–32)
Calcium: 9.2 mg/dL (ref 8.9–10.3)
Chloride: 101 mmol/L (ref 98–111)
Creatinine, Ser: 0.79 mg/dL (ref 0.44–1.00)
GFR, Estimated: >60 mL/min (ref >60)
Glucose, Bld: 179 mg/dL — ABNORMAL HIGH (ref 70–99)
Potassium: 4.1 mmol/L (ref 3.5–5.1)
Sodium: 136 mmol/L (ref 135–145)

## 2021-06-23 MED ORDER — EPINEPHRINE PF 1 MG/ML IJ SOLN: 0.3000 mg | Freq: Once | INTRAMUSCULAR | Status: DC | PRN

## 2021-06-23 MED ORDER — IPRATROPIUM-ALBUTEROL 0.5-2.5 (3) MG/3ML IN SOLN: 3.0000 mL | Freq: Four times a day (QID) | RESPIRATORY_TRACT | 0 refills | Status: DC | PRN

## 2021-06-23 MED ORDER — PREDNISONE 10 MG PO TABS: ORAL_TABLET | ORAL | 0 refills | Status: AC

## 2021-06-23 NOTE — TOC Transition Note (Signed)
Transition of Care [facility]) - CM/SW Discharge Note   Patient Details  Name: Linda Moreno MRN: [id number] Date of Birth: December 16, 1945  Transition of Care [facility] CM/SW Contact:  Konrad Penta, RN Phone Number: [phone] 06/23/2021, 1:40 PM   Clinical Narrative:   Verified with patient's nurse that she will not need home oxygen. Spoke with [facility] to assess for TOC needs. [name] denies having any TOC needs. Will return home with spouse.    Final next level of care: Home/Self Care Barriers to Discharge: No Barriers Identified   Patient Goals and CMS Choice Patient states their goals for this hospitalization and ongoing recovery are:: return home      Discharge Placement                       Discharge Plan and Services                                     Social Determinants of Health (SDOH) Interventions     Readmission Risk Interventions No flowsheet data found.

## 2021-06-23 NOTE — Discharge Summary (Signed)
Physician Discharge Summary  [place] TKP:[id number] DOB: 07-05-1945 DOA: 06/22/2021  PCP: [name], FNP  Admit date: 06/22/2021 Discharge date: 06/23/2021  Admitted From: Home Disposition: Home  Recommendations for Outpatient Follow-up:  Follow up with PCP in 1-2 weeks Please obtain BMP/CBC in one week Please follow up with pulmonology in the next few weeks as scheduled  Home Health: None Equipment/Devices: None  Discharge Condition: Stable CODE STATUS: Full Diet recommendation: Low-salt low-fat diet  Brief/Interim Summary: Linda Moreno is a 76 y.o. female with medical history significant of asthma, hypertension, hyperlipidemia presenting with ongoing shortness of breath. Patient was seen in the ED around 5 days ago with similar symptoms treated with multiple nebulizers with resolving symptoms and discharged home. She reports worsening symptoms over the past few - reporting back to ED with worse hypoxia not responding to nebs as previous. Hospitalist called for admit.    Patient presented as above with acute asthma exacerbation with acute respiratory failure and hypoxia.  Failure of outpatient therapy.  At intake patient was noted to be hypoxic requiring upwards of 2 L nasal cannula with sats in the low 80s even at rest.  After high-dose steroids around-the-clock nebulizer treatments and supportive care patient's respiratory status appears to be resolving.  Patient is not able to ambulate without any profound hypoxia or dyspnea.  She is requesting discharge home which is certainly reasonable.  We will discharge patient with nebulizer machine and nebs as she currently is relying on her handheld ProAir rescue inhaler for any and all exacerbations including minor to moderate symptoms.  We recommend close follow-up with pulmonology in the next few weeks to further discuss her asthma management as she has been quite controlled for some years and has now had multiple episodes of hypoxia in  the past few weeks.  She is otherwise stable and agreeable for discharge home, no other home medication changes were made.  Discharge Diagnoses:  Principal Problem:   Asthma exacerbation Active Problems:   Essential hypertension   Mixed hyperlipidemia   Environmental allergies   Acute respiratory failure with hypoxia [facility])  Discharge Instructions  Discharge Instructions     For home use only DME Nebulizer machine   Complete by: As directed    Patient needs a nebulizer to treat with the following condition: Asthma   Length of Need: Lifetime      Allergies as of 06/23/2021       Reactions   Shellfish Allergy         Medication List     TAKE these medications    albuterol 108 (90 Base) MCG/ACT inhaler Commonly known as: VENTOLIN HFA Inhale 2 puffs into the lungs every 4 (four) hours as needed for wheezing or shortness of breath.   atorvastatin 10 MG tablet Commonly known as: LIPITOR TAKE 1 TABLET EVERY DAY   cetirizine 10 MG tablet Commonly known as: ZyrTEC Allergy Take 1 tablet (10 mg total) by mouth daily.   EPINEPHrine 0.3 mg/0.3 mL Soaj injection Commonly known as: EPI-PEN Inject into the muscle.   fluticasone furoate-vilanterol 100-25 MCG/INH Aepb Commonly known as: BREO ELLIPTA Inhale 1 puff into the lungs daily.   ibandronate 150 MG tablet Commonly known as: BONIVA TAKE 1 TABLET ORALLY ONCE MONTHLY WITH 8-10 OZ WATER. SIT UPRIGHT AND NO FOOD/DRINK FOR 1 HOUR. What changed:  how much to take how to take this when to take this   ipratropium-albuterol 0.5-2.5 (3) MG/3ML Soln Commonly known as: DUONEB Take 3 mLs  by nebulization every 6 (six) hours as needed.   montelukast 10 MG tablet Commonly known as: SINGULAIR Take 1 tablet (10 mg total) by mouth daily.   multivitamin-iron-minerals-folic acid chewable tablet Chew 1 tablet by mouth daily.   predniSONE 10 MG tablet Commonly known as: DELTASONE Take 4 tablets (40 mg total) by mouth daily for  3 days, THEN 3 tablets (30 mg total) daily for 3 days, THEN 2 tablets (20 mg total) daily for 3 days, THEN 1 tablet (10 mg total) daily for 3 days. Start taking on: June 23, 2021               Durable Medical Equipment  (From admission, onward)           Start     Ordered   06/23/21 0000  For home use only DME Nebulizer machine       Question Answer Comment  Patient needs a nebulizer to treat with the following condition Asthma   Length of Need Lifetime      06/23/21 1320            Allergies  Allergen Reactions   Shellfish Allergy     Consultations: None  Procedures/Studies: DG Chest 2 View  Result Date: 06/17/2021 CLINICAL DATA:  Shortness of breath EXAM: CHEST - 2 VIEW COMPARISON:  06/24/2018 FINDINGS: Atherosclerotic calcification of the aortic arch. Thoracic spondylosis and degenerative disc disease. 1.3 cm density medially at the right lung apex, possibly from superimposition of osseous shadows, cannot exclude pulmonary nodule. The lungs appear otherwise clear. No blunting of the costophrenic angles. IMPRESSION: 1. Questionable nodule medially at the right lung apex. Chest CT is recommended for definitive characterization. 2.  Aortic Atherosclerosis (ICD10-I70.0). 3. Thoracic spondylosis and degenerative disc disease. Electronically Signed   By: Van Clines M.D.   On: 06/17/2021 11:51   CT Chest W Contrast  Result Date: 06/17/2021 CLINICAL DATA:  Possible nodule at the medial right lung apex on recent chest radiographs. EXAM: CT CHEST WITH CONTRAST TECHNIQUE: Multidetector CT imaging of the chest was performed during intravenous contrast administration. RADIATION DOSE REDUCTION: This exam was performed according to the departmental dose-optimization program which includes automated exposure control, adjustment of the mA and/or kV according to patient size and/or use of iterative reconstruction technique. CONTRAST:  30mL OMNIPAQUE IOHEXOL 300 MG/ML  SOLN  COMPARISON:  Chest radiographs dated 06/17/2021 and 06/24/2018. FINDINGS: Cardiovascular: Mild aortic atheromatous calcification. Normal sized heart. Mediastinum/Nodes: No enlarged mediastinal, hilar, or axillary lymph nodes. Thyroid gland, trachea, and esophagus demonstrate no significant findings. Lungs/Pleura: Minimal atelectasis or scarring at the medial right lung base. Otherwise, clear lungs. No lung nodules are seen. No pleural fluid. No pleural effusion or pneumothorax. Upper Abdomen: 1.5 cm right lobe liver cyst. Musculoskeletal: Extensive thoracic spine degenerative changes. IMPRESSION: 1. No acute abnormality. 2. No lung nodules seen. The recently suspected nodule at the medial right lung apex was due to bony overlap. Aortic Atherosclerosis (ICD10-I70.0). Electronically Signed   By: Claudie Revering M.D.   On: 06/17/2021 14:41   DG Chest Port 1 View  Result Date: 06/22/2021 CLINICAL DATA:  Shortness of breath EXAM: PORTABLE CHEST 1 VIEW COMPARISON:  06/17/2021 FINDINGS: Cardiac shadow is within normal limits. Aortic calcifications are again seen. Lungs are well aerated bilaterally. No focal infiltrate or sizable effusion is seen. Degenerative changes of the thoracic spine are noted. IMPRESSION: No acute abnormality noted. Electronically Signed   By: Inez Catalina M.D.   On: 06/22/2021 22:57  Subjective: No acute issues or events overnight   Discharge Exam: Vitals:   06/23/21 0911 06/23/21 1104  BP:  (!) 148/69  Pulse:  94  Resp:  15  Temp:  98.1 F (36.7 C)  SpO2: 98% 99%   Vitals:   06/23/21 0511 06/23/21 0743 06/23/21 0911 06/23/21 1104  BP: (!) 155/88 118/90  (!) 148/69  Pulse: 99 89  94  Resp: 16 16  15   Temp: 98.3 F (36.8 C) 98.1 F (36.7 C)  98.1 F (36.7 C)  TempSrc: Oral Oral  Oral  SpO2: 99% 98% 98% 99%  Weight: 64.6 kg     Height: 4\' 9"  (1.448 m)       General: Pt is alert, awake, not in acute distress Cardiovascular: RRR, S1/S2 +, no rubs, no  gallops Respiratory: CTA bilaterally, no wheezing, no rhonchi Abdominal: Soft, NT, ND, bowel sounds + Extremities: no edema, no cyanosis    The results of significant diagnostics from this hospitalization (including imaging, microbiology, ancillary and laboratory) are listed below for reference.     Microbiology: Recent Results (from the past 240 hour(s))  Resp Panel by RT-PCR (Flu A&B, Covid) Nasopharyngeal Swab     Status: None   Collection Time: 06/17/21 11:15 AM   Specimen: Nasopharyngeal Swab; Nasopharyngeal(NP) swabs in vial transport medium  Result Value Ref Range Status   SARS Coronavirus 2 by RT PCR NEGATIVE NEGATIVE Final    Comment: (NOTE) SARS-CoV-2 target nucleic acids are NOT DETECTED.  The SARS-CoV-2 RNA is generally detectable in upper respiratory specimens during the acute phase of infection. The lowest concentration of SARS-CoV-2 viral copies this assay can detect is 138 copies/mL. A negative result does not preclude SARS-Cov-2 infection and should not be used as the sole basis for treatment or other patient management decisions. A negative result may occur with  improper specimen collection/handling, submission of specimen other than nasopharyngeal swab, presence of viral mutation(s) within the areas targeted by this assay, and inadequate number of viral copies(<138 copies/mL). A negative result must be combined with clinical observations, patient history, and epidemiological information. The expected result is Negative.  Fact Sheet for Patients:  EntrepreneurPulse.com.au  Fact Sheet for Healthcare Providers:  IncredibleEmployment.be  This test is no t yet approved or cleared by the [place] FDA and  has been authorized for detection and/or diagnosis of SARS-CoV-2 by FDA under an Emergency Use Authorization (EUA). This EUA will remain  in effect (meaning this test can be used) for the duration of the COVID-19  declaration under Section 564(b)(1) of the Act, 21 U.S.C.section 360bbb-3(b)(1), unless the authorization is terminated  or revoked sooner.       Influenza A by PCR NEGATIVE NEGATIVE Final   Influenza B by PCR NEGATIVE NEGATIVE Final    Comment: (NOTE) The Xpert Xpress SARS-CoV-2/FLU/RSV plus assay is intended as an aid in the diagnosis of influenza from Nasopharyngeal swab specimens and should not be used as a sole basis for treatment. Nasal washings and aspirates are unacceptable for Xpert Xpress SARS-CoV-2/FLU/RSV testing.  Fact Sheet for Patients: EntrepreneurPulse.com.au  Fact Sheet for Healthcare Providers: IncredibleEmployment.be  This test is not yet approved or cleared by the [place] FDA and has been authorized for detection and/or diagnosis of SARS-CoV-2 by FDA under an Emergency Use Authorization (EUA). This EUA will remain in effect (meaning this test can be used) for the duration of the COVID-19 declaration under Section 564(b)(1) of the Act, 21 U.S.C. section 360bbb-3(b)(1), unless the authorization is terminated  or revoked.  Performed at [facility], [place] [address] [place], [place] [address]   Resp Panel by RT-PCR (Flu A&B, Covid) Nasopharyngeal Swab     Status: None   Collection Time: 06/22/21 10:22 PM   Specimen: Nasopharyngeal Swab; Nasopharyngeal(NP) swabs in vial transport medium  Result Value Ref Range Status   SARS Coronavirus 2 by RT PCR NEGATIVE NEGATIVE Final    Comment: (NOTE) SARS-CoV-2 target nucleic acids are NOT DETECTED.  The SARS-CoV-2 RNA is generally detectable in upper respiratory specimens during the acute phase of infection. The lowest concentration of SARS-CoV-2 viral copies this assay can detect is 138 copies/mL. A negative result does not preclude SARS-Cov-2 infection and should not be used as the sole basis for treatment or other patient management decisions. A negative result may  occur with  improper specimen collection/handling, submission of specimen other than nasopharyngeal swab, presence of viral mutation(s) within the areas targeted by this assay, and inadequate number of viral copies(<138 copies/mL). A negative result must be combined with clinical observations, patient history, and epidemiological information. The expected result is Negative.  Fact Sheet for Patients:  EntrepreneurPulse.com.au  Fact Sheet for Healthcare Providers:  IncredibleEmployment.be  This test is no t yet approved or cleared by the [place] FDA and  has been authorized for detection and/or diagnosis of SARS-CoV-2 by FDA under an Emergency Use Authorization (EUA). This EUA will remain  in effect (meaning this test can be used) for the duration of the COVID-19 declaration under Section 564(b)(1) of the Act, 21 U.S.C.section 360bbb-3(b)(1), unless the authorization is terminated  or revoked sooner.       Influenza A by PCR NEGATIVE NEGATIVE Final   Influenza B by PCR NEGATIVE NEGATIVE Final    Comment: (NOTE) The Xpert Xpress SARS-CoV-2/FLU/RSV plus assay is intended as an aid in the diagnosis of influenza from Nasopharyngeal swab specimens and should not be used as a sole basis for treatment. Nasal washings and aspirates are unacceptable for Xpert Xpress SARS-CoV-2/FLU/RSV testing.  Fact Sheet for Patients: EntrepreneurPulse.com.au  Fact Sheet for Healthcare Providers: IncredibleEmployment.be  This test is not yet approved or cleared by the [place] FDA and has been authorized for detection and/or diagnosis of SARS-CoV-2 by FDA under an Emergency Use Authorization (EUA). This EUA will remain in effect (meaning this test can be used) for the duration of the COVID-19 declaration under Section 564(b)(1) of the Act, 21 U.S.C. section 360bbb-3(b)(1), unless the authorization is terminated  or revoked.  Performed at [facility], [place] [address], [place], [place] [address]      Labs: BNP (last 3 results) No results for input(s): BNP in the last 8760 hours. Basic Metabolic Panel: Recent Labs  Lab 06/17/21 1258 06/22/21 2209 06/23/21 0629  NA 138 137 136  K 5.4* 3.6 4.1  CL 101 103 101  CO2 25 25 23   GLUCOSE 108* 141* 179*  BUN 11 12 14   CREATININE 0.73 0.73 0.79  CALCIUM 9.2 9.5 9.2   Liver Function Tests: No results for input(s): AST, ALT, ALKPHOS, BILITOT, PROT, ALBUMIN in the last 168 hours. No results for input(s): LIPASE, AMYLASE in the last 168 hours. No results for input(s): AMMONIA in the last 168 hours. CBC: Recent Labs  Lab 06/17/21 1258 06/22/21 2209 06/23/21 0629  WBC 6.7 8.2 7.1  NEUTROABS  --  4.1  --   HGB 14.6 14.9 14.0  HCT 44.0 43.1 42.1  MCV 89.4 89.6 89.4  PLT 285 291  264   Cardiac Enzymes: No results for input(s): CKTOTAL, CKMB, CKMBINDEX, TROPONINI in the last 168 hours. BNP: Invalid input(s): POCBNP CBG: No results for input(s): GLUCAP in the last 168 hours. D-Dimer No results for input(s): DDIMER in the last 72 hours. Hgb A1c No results for input(s): HGBA1C in the last 72 hours. Lipid Profile No results for input(s): CHOL, HDL, LDLCALC, TRIG, CHOLHDL, LDLDIRECT in the last 72 hours. Thyroid function studies No results for input(s): TSH, T4TOTAL, T3FREE, THYROIDAB in the last 72 hours.  Invalid input(s): FREET3 Anemia work up No results for input(s): VITAMINB12, FOLATE, FERRITIN, TIBC, IRON, RETICCTPCT in the last 72 hours. Urinalysis    Component Value Date/Time   BILIRUBINUR Negative 04/11/2021 1240   PROTEINUR Negative 04/11/2021 1240   UROBILINOGEN 1.0 04/11/2021 1240   NITRITE Negative 04/11/2021 1240   LEUKOCYTESUR Negative 04/11/2021 1240   Sepsis Labs Invalid input(s): PROCALCITONIN,  WBC,  LACTICIDVEN Microbiology Recent Results (from the past 240 hour(s))  Resp Panel by RT-PCR (Flu A&B, Covid)  Nasopharyngeal Swab     Status: None   Collection Time: 06/17/21 11:15 AM   Specimen: Nasopharyngeal Swab; Nasopharyngeal(NP) swabs in vial transport medium  Result Value Ref Range Status   SARS Coronavirus 2 by RT PCR NEGATIVE NEGATIVE Final    Comment: (NOTE) SARS-CoV-2 target nucleic acids are NOT DETECTED.  The SARS-CoV-2 RNA is generally detectable in upper respiratory specimens during the acute phase of infection. The lowest concentration of SARS-CoV-2 viral copies this assay can detect is 138 copies/mL. A negative result does not preclude SARS-Cov-2 infection and should not be used as the sole basis for treatment or other patient management decisions. A negative result may occur with  improper specimen collection/handling, submission of specimen other than nasopharyngeal swab, presence of viral mutation(s) within the areas targeted by this assay, and inadequate number of viral copies(<138 copies/mL). A negative result must be combined with clinical observations, patient history, and epidemiological information. The expected result is Negative.  Fact Sheet for Patients:  EntrepreneurPulse.com.au  Fact Sheet for Healthcare Providers:  IncredibleEmployment.be  This test is no t yet approved or cleared by the [place] FDA and  has been authorized for detection and/or diagnosis of SARS-CoV-2 by FDA under an Emergency Use Authorization (EUA). This EUA will remain  in effect (meaning this test can be used) for the duration of the COVID-19 declaration under Section 564(b)(1) of the Act, 21 U.S.C.section 360bbb-3(b)(1), unless the authorization is terminated  or revoked sooner.       Influenza A by PCR NEGATIVE NEGATIVE Final   Influenza B by PCR NEGATIVE NEGATIVE Final    Comment: (NOTE) The Xpert Xpress SARS-CoV-2/FLU/RSV plus assay is intended as an aid in the diagnosis of influenza from Nasopharyngeal swab specimens and should not be  used as a sole basis for treatment. Nasal washings and aspirates are unacceptable for Xpert Xpress SARS-CoV-2/FLU/RSV testing.  Fact Sheet for Patients: EntrepreneurPulse.com.au  Fact Sheet for Healthcare Providers: IncredibleEmployment.be  This test is not yet approved or cleared by the [place] FDA and has been authorized for detection and/or diagnosis of SARS-CoV-2 by FDA under an Emergency Use Authorization (EUA). This EUA will remain in effect (meaning this test can be used) for the duration of the COVID-19 declaration under Section 564(b)(1) of the Act, 21 U.S.C. section 360bbb-3(b)(1), unless the authorization is terminated or revoked.  Performed at [facility], [place] [address] [place], [place] [address]   Resp Panel by RT-PCR (Flu A&B,  Covid) Nasopharyngeal Swab     Status: None   Collection Time: 06/22/21 10:22 PM   Specimen: Nasopharyngeal Swab; Nasopharyngeal(NP) swabs in vial transport medium  Result Value Ref Range Status   SARS Coronavirus 2 by RT PCR NEGATIVE NEGATIVE Final    Comment: (NOTE) SARS-CoV-2 target nucleic acids are NOT DETECTED.  The SARS-CoV-2 RNA is generally detectable in upper respiratory specimens during the acute phase of infection. The lowest concentration of SARS-CoV-2 viral copies this assay can detect is 138 copies/mL. A negative result does not preclude SARS-Cov-2 infection and should not be used as the sole basis for treatment or other patient management decisions. A negative result may occur with  improper specimen collection/handling, submission of specimen other than nasopharyngeal swab, presence of viral mutation(s) within the areas targeted by this assay, and inadequate number of viral copies(<138 copies/mL). A negative result must be combined with clinical observations, patient history, and epidemiological information. The expected result is Negative.  Fact Sheet for Patients:   EntrepreneurPulse.com.au  Fact Sheet for Healthcare Providers:  IncredibleEmployment.be  This test is no t yet approved or cleared by the [place] FDA and  has been authorized for detection and/or diagnosis of SARS-CoV-2 by FDA under an Emergency Use Authorization (EUA). This EUA will remain  in effect (meaning this test can be used) for the duration of the COVID-19 declaration under Section 564(b)(1) of the Act, 21 U.S.C.section 360bbb-3(b)(1), unless the authorization is terminated  or revoked sooner.       Influenza A by PCR NEGATIVE NEGATIVE Final   Influenza B by PCR NEGATIVE NEGATIVE Final    Comment: (NOTE) The Xpert Xpress SARS-CoV-2/FLU/RSV plus assay is intended as an aid in the diagnosis of influenza from Nasopharyngeal swab specimens and should not be used as a sole basis for treatment. Nasal washings and aspirates are unacceptable for Xpert Xpress SARS-CoV-2/FLU/RSV testing.  Fact Sheet for Patients: EntrepreneurPulse.com.au  Fact Sheet for Healthcare Providers: IncredibleEmployment.be  This test is not yet approved or cleared by the [place] FDA and has been authorized for detection and/or diagnosis of SARS-CoV-2 by FDA under an Emergency Use Authorization (EUA). This EUA will remain in effect (meaning this test can be used) for the duration of the COVID-19 declaration under Section 564(b)(1) of the Act, 21 U.S.C. section 360bbb-3(b)(1), unless the authorization is terminated or revoked.  Performed at [facility], [place] [address], [place], [place] [address]      Time coordinating discharge: Over 30 minutes  SIGNED:   Little Ishikawa, DO [facility] 06/23/2021, 1:20 PM Pager   If 7PM-7AM, please contact night-coverage [email or web address]

## 2021-06-23 NOTE — Progress Notes (Signed)
Patient ambulated 457ft on room air with no mobility devices. O2 saturation maintained 95-100% with no c/o SOB. O2 sat dropped to 91% once while pt was talking but no complaints.

## 2021-06-25 ENCOUNTER — Other Ambulatory Visit: Payer: Self-pay

## 2021-06-25 ENCOUNTER — Ambulatory Visit (HOSPITAL_BASED_OUTPATIENT_CLINIC_OR_DEPARTMENT_OTHER): Payer: Medicare HMO | Admitting: Cardiovascular Disease

## 2021-06-25 ENCOUNTER — Encounter (HOSPITAL_BASED_OUTPATIENT_CLINIC_OR_DEPARTMENT_OTHER): Payer: Self-pay | Admitting: Cardiovascular Disease

## 2021-06-25 VITALS — BP 127/80 | HR 87 | Ht <= 58 in | Wt 146.4 lb

## 2021-06-25 DIAGNOSIS — R0602 Shortness of breath: Secondary | ICD-10-CM

## 2021-06-25 DIAGNOSIS — I4510 Unspecified right bundle-branch block: Secondary | ICD-10-CM | POA: Insufficient documentation

## 2021-06-25 DIAGNOSIS — J45909 Unspecified asthma, uncomplicated: Secondary | ICD-10-CM | POA: Diagnosis not present

## 2021-06-25 DIAGNOSIS — E782 Mixed hyperlipidemia: Secondary | ICD-10-CM

## 2021-06-25 DIAGNOSIS — I10 Essential (primary) hypertension: Secondary | ICD-10-CM

## 2021-06-25 HISTORY — DX: Unspecified right bundle-branch block: I45.10

## 2021-06-25 NOTE — Assessment & Plan Note (Signed)
Noted on recent echocardiograms.  I suspect this is related to her underlying asthma.  She does not have any signs of right sided heart failure, though her JVP is mildly elevated on exam today.  We will get an echocardiogram to ensure that she does not have any right-sided systolic dysfunction.  I suspect that her asthma needs to be better controlled.

## 2021-06-25 NOTE — Assessment & Plan Note (Signed)
Lipids are well controlled on atorvastatin.  Continue medication as well as diet and exercise.

## 2021-06-25 NOTE — Assessment & Plan Note (Signed)
She has a history of asthma and shortness of breath.  She had mild wheezing on exam today and has been hospitalized in the past.  Checking an echocardiogram as above due to mildly elevated JVP and new right bundle much block.

## 2021-06-25 NOTE — Assessment & Plan Note (Signed)
Blood pressure has been elevated in the past but seems mostly stress related.  Since her stressors have improved she has not had any hypertension.  No plans for medication at this time.

## 2021-06-25 NOTE — Progress Notes (Signed)
Cardiology Office Initial Assessment:    Date:  06/25/2021   ID:  Linda Moreno, DOB 1946/05/10, MRN [id number]  PCP:  [name], FNP  Cardiologist:  None  Nephrologist:  Referring MD: [name], FNP   CC: Hypertension  History of Present Illness:    Linda Moreno is a 76 y.o. female with a hx of hypertension, hyperlipidemia, and asthma, here to establish care in the [facility]. She saw [name], FNP on 03/2021. At that visit her BP was 128/78. Her EKG showed a new RBBB and concern for LVH so she was referred to cardiology.  Today, she is feeling fine overall. She was admitted to the hospital 06/22/2021 due to a recent flare-up of asthma, but she appears to have recovered well. She will use her inhalers if she begins to feel her asthmatic symptoms starting. Generally, she considers her heart to be healthy. She is exercising regularly at home by following TV exercise programs. She denies any palpitations, or chest pain. No lightheadedness, headaches, syncope, orthopnea, PND, or lower extremity edema.  She notes that in the past her blood pressure has been elevated.  However that was mostly in the setting of dealing with her mother's illness and other life stressors.  Lately it has been much better controlled.  She has not been taking any medications.   Past Medical History:  Diagnosis Date   Asthma    Right bundle branch block 06/25/2021    History reviewed. No pertinent surgical history.  Current Medications: Current Meds  Medication Sig   albuterol (PROVENTIL HFA;VENTOLIN HFA) 108 (90 Base) MCG/ACT inhaler Inhale 2 puffs into the lungs every 4 (four) hours as needed for wheezing or shortness of breath.   atorvastatin (LIPITOR) 10 MG tablet TAKE 1 TABLET EVERY DAY (Patient taking differently: Take 10 mg by mouth daily.)   cetirizine (ZYRTEC ALLERGY) 10 MG tablet Take 1 tablet (10 mg total) by mouth daily.   EPINEPHrine 0.3 mg/0.3 mL IJ SOAJ injection  Inject into the muscle.   fluticasone furoate-vilanterol (BREO ELLIPTA) 100-25 MCG/INH AEPB Inhale 1 puff into the lungs daily.   ibandronate (BONIVA) 150 MG tablet TAKE 1 TABLET ORALLY ONCE MONTHLY WITH 8-10 OZ WATER. SIT UPRIGHT AND NO FOOD/DRINK FOR 1 HOUR. (Patient taking differently: Take 150 mg by mouth every 30 (thirty) days. TAKE 1 TABLET ORALLY ONCE MONTHLY WITH 8-10 OZ WATER. SIT UPRIGHT AND NO FOOD/DRINK FOR 1 HOUR.)   ipratropium-albuterol (DUONEB) 0.5-2.5 (3) MG/3ML SOLN Take 3 mLs by nebulization every 6 (six) hours as needed.   montelukast (SINGULAIR) 10 MG tablet Take 1 tablet (10 mg total) by mouth daily.   multivitamin-iron-minerals-folic acid (CENTRUM) chewable tablet Chew 1 tablet by mouth daily.   predniSONE (DELTASONE) 10 MG tablet Take 4 tablets (40 mg total) by mouth daily for 3 days, THEN 3 tablets (30 mg total) daily for 3 days, THEN 2 tablets (20 mg total) daily for 3 days, THEN 1 tablet (10 mg total) daily for 3 days.     Allergies:   Shellfish allergy   Social History   Socioeconomic History   Marital status: Married    Spouse name: Not on file   Number of children: Not on file   Years of education: Not on file   Highest education level: Not on file  Occupational History   Occupation: retired  Tobacco Use   Smoking status: Never   Smokeless tobacco: Never  Vaping Use   Vaping Use: Never used  Substance and Sexual Activity   Alcohol use: No   Drug use: No   Sexual activity: Yes  Other Topics Concern   Not on file  Social History Narrative   Not on file   Social Determinants of Health   Financial Resource Strain: Low Risk    Difficulty of Paying Living Expenses: Not hard at all  Food Insecurity: No Food Insecurity   Worried About Charity fundraiser in the Last Year: Never true   [place] in the Last Year: Never true  Transportation Needs: No Transportation Needs   Lack of Transportation (Medical): No   Lack of Transportation  (Non-Medical): No  Physical Activity: Inactive   Days of Exercise per Week: 0 days   Minutes of Exercise per Session: 0 min  Stress: No Stress Concern Present   Feeling of Stress : Not at all  Social Connections: Not on file     Family History: The patient's family history includes Diabetes in her father; Stroke in her father.  ROS:   Please see the history of present illness.    All other systems reviewed and are negative.  EKGs/Labs/Other Studies Reviewed:    CT Chest 06/17/2021: COMPARISON:  Chest radiographs dated 06/17/2021 and 06/24/2018.   FINDINGS: Cardiovascular: Mild aortic atheromatous calcification. Normal sized heart.   Mediastinum/Nodes: No enlarged mediastinal, hilar, or axillary lymph nodes. Thyroid gland, trachea, and esophagus demonstrate no significant findings.   Lungs/Pleura: Minimal atelectasis or scarring at the medial right lung base. Otherwise, clear lungs. No lung nodules are seen. No pleural fluid. No pleural effusion or pneumothorax.   Upper Abdomen: 1.5 cm right lobe liver cyst.   Musculoskeletal: Extensive thoracic spine degenerative changes.   IMPRESSION: 1. No acute abnormality. 2. No lung nodules seen. The recently suspected nodule at the medial right lung apex was due to bony overlap.   Aortic Atherosclerosis (ICD10-I70.0).  EKG:   06/25/2021: EKG was not ordered.  Recent Labs: 04/11/2021: ALT 18; TSH 1.320 06/23/2021: BUN 14; Creatinine, Ser 0.79; Hemoglobin 14.0; Platelets 264; Potassium 4.1; Sodium 136   Recent Lipid Panel    Component Value Date/Time   CHOL 181 04/11/2021 1237   TRIG 69 04/11/2021 1237   HDL 79 04/11/2021 1237   CHOLHDL 2.3 04/11/2021 1237   LDLCALC 89 04/11/2021 1237    Physical Exam:    VS:  BP 127/80 (BP Location: Left Arm, Patient Position: Sitting, Cuff Size: Normal)    Pulse 87    Ht 4\' 9"  (1.448 m)    Wt 146 lb 6.4 oz (66.4 kg)    SpO2 97%    BMI 31.68 kg/m  , BMI Body mass index is 31.68  kg/m. GENERAL:  Well appearing HEENT: Pupils equal round and reactive, fundi not visualized, oral mucosa unremarkable NECK: JVP 12 cm sitting upright.  Waveform within normal limits, carotid upstroke brisk and symmetric, no bruits, no thyromegaly LUNGS:  Clear to auscultation bilaterally, Mild expiratory wheezing bilaterally HEART:  RRR.  PMI not displaced or sustained,S1 and S2 within normal limits, no S3, no S4, no clicks, no rubs, no murmurs ABD:  Flat, positive bowel sounds normal in frequency in pitch, no bruits, no rebound, no guarding, no midline pulsatile mass, no hepatomegaly, no splenomegaly EXT:  2 plus pulses throughout, no edema, no cyanosis no clubbing SKIN:  No rashes no nodules NEURO:  Cranial nerves II through XII grossly intact, motor grossly intact throughout PSYCH:  Cognitively intact, oriented to person place and time  ASSESSMENT/PLAN:    Essential hypertension Blood pressure has been elevated in the past but seems mostly stress related.  Since her stressors have improved she has not had any hypertension.  No plans for medication at this time.  Mixed hyperlipidemia Lipids are well controlled on atorvastatin.  Continue medication as well as diet and exercise.  Right bundle branch block Noted on recent echocardiograms.  I suspect this is related to her underlying asthma.  She does not have any signs of right sided heart failure, though her JVP is mildly elevated on exam today.  We will get an echocardiogram to ensure that she does not have any right-sided systolic dysfunction.  I suspect that her asthma needs to be better controlled.  Asthma She has a history of asthma and shortness of breath.  She had mild wheezing on exam today and has been hospitalized in the past.  Checking an echocardiogram as above due to mildly elevated JVP and new right bundle much block.   Screening for Secondary Hypertension:     Relevant Labs/Studies: Basic Labs Latest Ref Rng & Units  06/23/2021 06/22/2021 06/17/2021  Sodium 135 - 145 mmol/L 136 137 138  Potassium 3.5 - 5.1 mmol/L 4.1 3.6 5.4(H)  Creatinine 0.44 - 1.00 mg/dL 0.79 0.73 0.73    Thyroid  Latest Ref Rng & Units 04/11/2021 04/01/2018  TSH 0.450 - 4.500 uIU/mL 1.320 1.350     Disposition:    FU with Yurika Pereda C. Oval Linsey, MD, [facility]  PRN.   Medication Adjustments/Labs and Tests Ordered: Current medicines are reviewed at length with the patient today.  Concerns regarding medicines are outlined above.   Orders Placed This Encounter  Procedures   ECHOCARDIOGRAM COMPLETE   No orders of the defined types were placed in this encounter.  I,Mathew Stumpf,acting as a Education administrator for Skeet Latch, MD.,have documented all relevant documentation on the behalf of Skeet Latch, MD,as directed by  Skeet Latch, MD while in the presence of Skeet Latch, MD.  I, [place] Oval Linsey, MD have reviewed all documentation for this visit.  The documentation of the exam, diagnosis, procedures, and orders on 06/25/2021 are all accurate and complete.   Signed, Skeet Latch, MD  06/25/2021 10:52 AM    [place]

## 2021-06-25 NOTE — Patient Instructions (Signed)
Medication Instructions:  Your physician recommends that you continue on your current medications as directed. Please refer to the Current Medication list given to you today.  Labwork: NONE  Testing/Procedures: Your physician has requested that you have an echocardiogram. Echocardiography is a painless test that uses sound waves to create images of your heart. It provides your doctor with information about the size and shape of your heart and how well your heart's chambers and valves are working. This procedure takes approximately one hour. There are no restrictions for this procedure.  Follow-Up: AS NEEDED    

## 2021-06-28 ENCOUNTER — Ambulatory Visit: Payer: Medicare HMO

## 2021-06-28 DIAGNOSIS — J4530 Mild persistent asthma, uncomplicated: Secondary | ICD-10-CM | POA: Diagnosis not present

## 2021-06-28 DIAGNOSIS — J301 Allergic rhinitis due to pollen: Secondary | ICD-10-CM | POA: Diagnosis not present

## 2021-06-28 DIAGNOSIS — J3089 Other allergic rhinitis: Secondary | ICD-10-CM | POA: Diagnosis not present

## 2021-06-28 DIAGNOSIS — Z91013 Allergy to seafood: Secondary | ICD-10-CM | POA: Diagnosis not present

## 2021-07-09 ENCOUNTER — Ambulatory Visit: Payer: Medicare HMO

## 2021-07-09 ENCOUNTER — Ambulatory Visit
Admission: RE | Admit: 2021-07-09 | Discharge: 2021-07-09 | Disposition: A | Discharge status: Home / Self Care | Payer: Medicare HMO | Source: Ambulatory Visit | Attending: Nurse Practitioner | Admitting: Nurse Practitioner

## 2021-07-09 ENCOUNTER — Other Ambulatory Visit: Payer: Self-pay

## 2021-07-09 DIAGNOSIS — J301 Allergic rhinitis due to pollen: Secondary | ICD-10-CM | POA: Diagnosis not present

## 2021-07-09 DIAGNOSIS — Z1231 Encounter for screening mammogram for malignant neoplasm of breast: Secondary | ICD-10-CM

## 2021-07-09 DIAGNOSIS — J3089 Other allergic rhinitis: Secondary | ICD-10-CM | POA: Diagnosis not present

## 2021-07-09 DIAGNOSIS — J3081 Allergic rhinitis due to animal (cat) (dog) hair and dander: Secondary | ICD-10-CM | POA: Diagnosis not present

## 2021-07-11 ENCOUNTER — Other Ambulatory Visit (HOSPITAL_COMMUNITY): Payer: Medicare HMO

## 2021-07-11 DIAGNOSIS — J4530 Mild persistent asthma, uncomplicated: Secondary | ICD-10-CM | POA: Diagnosis not present

## 2021-07-15 ENCOUNTER — Ambulatory Visit (HOSPITAL_COMMUNITY): Payer: Medicare HMO | Attending: Cardiovascular Disease

## 2021-07-15 ENCOUNTER — Other Ambulatory Visit: Payer: Self-pay

## 2021-07-15 DIAGNOSIS — I10 Essential (primary) hypertension: Secondary | ICD-10-CM | POA: Diagnosis not present

## 2021-07-15 DIAGNOSIS — R0602 Shortness of breath: Secondary | ICD-10-CM | POA: Insufficient documentation

## 2021-07-15 LAB — ECHOCARDIOGRAM COMPLETE
Area-P 1/2: 3.5 cm2
P 1/2 time: 338 msec
S' Lateral: 2.6 cm

## 2021-07-18 NOTE — Progress Notes (Signed)
Progress Note  Clarification  Asthma exacerbation Acute respiratory failure with hypoxia > Presenting with ongoing shortness of breath with wheezing in the setting of known asthma.  Previously treated in the past week for asthma exacerbation in the ED with nebulizers with some improvement. > Symptoms have returned to her last few days not responding to home treatments and not responding to initial treatments in the ED which included Solu-Medrol, continuous albuterol, Atrovent. Clarification --> Given negative Covid and flu screen in ED, will check respiratory viral p[anel to evaluate for etiology of asthma exacerbation. > Saturating 86% on room air, improved on 2 L. - Monitor on telemetry - Daily prednisone - Scheduled DuoNebs every 6 hours - As needed albuterol every 2 hours - Check RVP - Continue supplemental oxygen, wean as tolerated - Continue home Breo

## 2021-07-22 ENCOUNTER — Encounter: Payer: Self-pay | Admitting: Nurse Practitioner

## 2021-07-22 ENCOUNTER — Ambulatory Visit (INDEPENDENT_AMBULATORY_CARE_PROVIDER_SITE_OTHER): Payer: Medicare HMO | Admitting: Nurse Practitioner

## 2021-07-22 ENCOUNTER — Other Ambulatory Visit: Payer: Self-pay

## 2021-07-22 VITALS — BP 128/64 | HR 77 | Temp 98.3°F | Ht <= 58 in | Wt 148.8 lb

## 2021-07-22 DIAGNOSIS — I10 Essential (primary) hypertension: Secondary | ICD-10-CM

## 2021-07-22 DIAGNOSIS — E6609 Other obesity due to excess calories: Secondary | ICD-10-CM | POA: Diagnosis not present

## 2021-07-22 DIAGNOSIS — E559 Vitamin D deficiency, unspecified: Secondary | ICD-10-CM

## 2021-07-22 DIAGNOSIS — Z6832 Body mass index (BMI) 32.0-32.9, adult: Secondary | ICD-10-CM

## 2021-07-22 DIAGNOSIS — E782 Mixed hyperlipidemia: Secondary | ICD-10-CM

## 2021-07-22 NOTE — Progress Notes (Signed)
I,Tianna Badgett,acting as a Education administrator for [employer], FNP.,have documented all relevant documentation on the behalf of [name], FNP,as directed by  [name], FNP while in the presence of [name], [place].  This visit occurred during the SARS-CoV-2 public health emergency.  Safety protocols were in place, including screening questions prior to the visit, additional usage of staff PPE, and extensive cleaning of exam room while observing appropriate contact time as indicated for disinfecting solutions.  Subjective:     Patient ID: Linda Moreno , female    DOB: 21-Nov-1945 , 76 y.o.   MRN: [id number]   Chief Complaint  Patient presents with   Hypertension    HPI  Patient presents today for a blood pressure f/u. She had an asthma exacerbation due to her allergy exacerbation. She was also out of her Breo. She also has a nebulizer machine.   Hypertension This is a chronic problem. The current episode started more than 1 year ago. The problem has been gradually improving since onset. The problem is controlled. Pertinent negatives include no anxiety, chest pain, headaches, palpitations or shortness of breath. There are no associated agents to hypertension. Risk factors for coronary artery disease include obesity and sedentary lifestyle. Past treatments include calcium channel blockers. The current treatment provides significant improvement. There are no compliance problems.  There is no history of angina or kidney disease. There is no history of chronic renal disease.    Past Medical History:  Diagnosis Date   Asthma    Right bundle branch block 06/25/2021     Family History  Problem Relation Age of Onset   Diabetes Father    Stroke Father    Breast cancer Neg Hx      Current Outpatient Medications:    albuterol (PROVENTIL HFA;VENTOLIN HFA) 108 (90 Base) MCG/ACT inhaler, Inhale 2 puffs into the lungs every 4 (four) hours as needed for wheezing or shortness of breath., Disp: , Rfl:     atorvastatin (LIPITOR) 10 MG tablet, TAKE 1 TABLET EVERY DAY (Patient taking differently: Take 10 mg by mouth daily.), Disp: 90 tablet, Rfl: 1   cetirizine (ZYRTEC ALLERGY) 10 MG tablet, Take 1 tablet (10 mg total) by mouth daily., Disp: 90 tablet, Rfl: 1   EPINEPHrine 0.3 mg/0.3 mL IJ SOAJ injection, Inject into the muscle., Disp: , Rfl:    fluticasone furoate-vilanterol (BREO ELLIPTA) 100-25 MCG/INH AEPB, Inhale 1 puff into the lungs daily., Disp: , Rfl:    ibandronate (BONIVA) 150 MG tablet, TAKE 1 TABLET ORALLY ONCE MONTHLY WITH 8-10 OZ WATER. SIT UPRIGHT AND NO FOOD/DRINK FOR 1 HOUR. (Patient taking differently: Take 150 mg by mouth every 30 (thirty) days. TAKE 1 TABLET ORALLY ONCE MONTHLY WITH 8-10 OZ WATER. SIT UPRIGHT AND NO FOOD/DRINK FOR 1 HOUR.), Disp: 30 tablet, Rfl: 0   ipratropium-albuterol (DUONEB) 0.5-2.5 (3) MG/3ML SOLN, Take 3 mLs by nebulization every 6 (six) hours as needed., Disp: 360 mL, Rfl: 0   montelukast (SINGULAIR) 10 MG tablet, Take 1 tablet (10 mg total) by mouth daily., Disp: 90 tablet, Rfl: 1   multivitamin-iron-minerals-folic acid (CENTRUM) chewable tablet, Chew 1 tablet by mouth daily., Disp: , Rfl:    Allergies  Allergen Reactions   Shellfish Allergy      Review of Systems  Constitutional: Negative.   Respiratory: Negative.  Negative for shortness of breath.   Cardiovascular: Negative.  Negative for chest pain and palpitations.  Gastrointestinal: Negative.   Neurological: Negative.  Negative for headaches.  Psychiatric/Behavioral: Negative.  Today's Vitals   07/22/21 1106  BP: 128/64  Pulse: 77  Temp: 98.3 F (36.8 C)  TempSrc: Oral  Weight: 148 lb 12.8 oz (67.5 kg)  Height: 4\' 9"  (1.448 m)   Body mass index is 32.2 kg/m.  Wt Readings from Last 3 Encounters:  07/22/21 148 lb 12.8 oz (67.5 kg)  06/25/21 146 lb 6.4 oz (66.4 kg)  06/23/21 142 lb 8 oz (64.6 kg)    Objective:  Physical Exam Vitals reviewed.  Constitutional:       General: She is not in acute distress.    Appearance: Normal appearance. She is obese.  Cardiovascular:     Rate and Rhythm: Normal rate and regular rhythm.     Pulses: Normal pulses.     Heart sounds: Normal heart sounds. No murmur heard. Pulmonary:     Effort: Pulmonary effort is normal. No respiratory distress.     Breath sounds: Normal breath sounds. No wheezing.  Skin:    Capillary Refill: Capillary refill takes less than 2 seconds.  Neurological:     General: No focal deficit present.     Mental Status: She is alert and oriented to person, place, and time.     Cranial Nerves: No cranial nerve deficit.     Motor: No weakness.  Psychiatric:        Mood and Affect: Mood normal.        Behavior: Behavior normal.        Thought Content: Thought content normal.        Judgment: Judgment normal.        Assessment And Plan:     1. Essential hypertension Comments: Blood pressure is normal, continue current medications  2. Mixed hyperlipidemia Comments: Stable, continue current medications, tolerating well.  3. Vitamin D deficiency Will check vitamin D level and supplement as needed.    Also encouraged to spend 15 minutes in the sun daily.   4. Class 1 obesity due to excess calories with serious comorbidity and body mass index (BMI) of 32.0 to 32.9 in adult She is encouraged to strive for BMI less than 30 to decrease cardiac risk. Advised to aim for at least 150 minutes of exercise per week.    Patient was given opportunity to ask questions. Patient verbalized understanding of the plan and was able to repeat key elements of the plan. All questions were answered to their satisfaction.  [name], FNP   I, [name], FNP, have reviewed all documentation for this visit. The documentation on 07/22/21 for the exam, diagnosis, procedures, and orders are all accurate and complete.   IF YOU HAVE BEEN REFERRED TO A SPECIALIST, IT MAY TAKE 1-2 WEEKS TO SCHEDULE/PROCESS THE  REFERRAL. IF YOU HAVE NOT HEARD FROM US/SPECIALIST IN TWO WEEKS, PLEASE GIVE [place] A CALL AT [phone].   THE PATIENT IS ENCOURAGED TO PRACTICE SOCIAL DISTANCING DUE TO THE COVID-19 PANDEMIC.

## 2021-07-22 NOTE — Patient Instructions (Signed)

## 2021-07-24 DIAGNOSIS — J3081 Allergic rhinitis due to animal (cat) (dog) hair and dander: Secondary | ICD-10-CM | POA: Diagnosis not present

## 2021-07-24 DIAGNOSIS — J301 Allergic rhinitis due to pollen: Secondary | ICD-10-CM | POA: Diagnosis not present

## 2021-07-24 DIAGNOSIS — J3089 Other allergic rhinitis: Secondary | ICD-10-CM | POA: Diagnosis not present

## 2021-07-31 DIAGNOSIS — J3081 Allergic rhinitis due to animal (cat) (dog) hair and dander: Secondary | ICD-10-CM | POA: Diagnosis not present

## 2021-07-31 DIAGNOSIS — J301 Allergic rhinitis due to pollen: Secondary | ICD-10-CM | POA: Diagnosis not present

## 2021-07-31 DIAGNOSIS — J3089 Other allergic rhinitis: Secondary | ICD-10-CM | POA: Diagnosis not present

## 2021-08-06 DIAGNOSIS — J3081 Allergic rhinitis due to animal (cat) (dog) hair and dander: Secondary | ICD-10-CM | POA: Diagnosis not present

## 2021-08-06 DIAGNOSIS — J301 Allergic rhinitis due to pollen: Secondary | ICD-10-CM | POA: Diagnosis not present

## 2021-08-06 DIAGNOSIS — J3089 Other allergic rhinitis: Secondary | ICD-10-CM | POA: Diagnosis not present

## 2021-08-14 DIAGNOSIS — J301 Allergic rhinitis due to pollen: Secondary | ICD-10-CM | POA: Diagnosis not present

## 2021-08-14 DIAGNOSIS — J3081 Allergic rhinitis due to animal (cat) (dog) hair and dander: Secondary | ICD-10-CM | POA: Diagnosis not present

## 2021-08-14 DIAGNOSIS — J3089 Other allergic rhinitis: Secondary | ICD-10-CM | POA: Diagnosis not present

## 2021-08-22 DIAGNOSIS — J301 Allergic rhinitis due to pollen: Secondary | ICD-10-CM | POA: Diagnosis not present

## 2021-08-22 DIAGNOSIS — J3089 Other allergic rhinitis: Secondary | ICD-10-CM | POA: Diagnosis not present

## 2021-08-22 DIAGNOSIS — J3081 Allergic rhinitis due to animal (cat) (dog) hair and dander: Secondary | ICD-10-CM | POA: Diagnosis not present

## 2021-08-24 DIAGNOSIS — J3089 Other allergic rhinitis: Secondary | ICD-10-CM | POA: Diagnosis not present

## 2021-08-24 DIAGNOSIS — J301 Allergic rhinitis due to pollen: Secondary | ICD-10-CM | POA: Diagnosis not present

## 2021-08-24 DIAGNOSIS — J3081 Allergic rhinitis due to animal (cat) (dog) hair and dander: Secondary | ICD-10-CM | POA: Diagnosis not present

## 2021-08-28 DIAGNOSIS — J3089 Other allergic rhinitis: Secondary | ICD-10-CM | POA: Diagnosis not present

## 2021-08-28 DIAGNOSIS — J3081 Allergic rhinitis due to animal (cat) (dog) hair and dander: Secondary | ICD-10-CM | POA: Diagnosis not present

## 2021-08-28 DIAGNOSIS — J301 Allergic rhinitis due to pollen: Secondary | ICD-10-CM | POA: Diagnosis not present

## 2021-09-12 DIAGNOSIS — J3089 Other allergic rhinitis: Secondary | ICD-10-CM | POA: Diagnosis not present

## 2021-09-12 DIAGNOSIS — J301 Allergic rhinitis due to pollen: Secondary | ICD-10-CM | POA: Diagnosis not present

## 2021-09-12 DIAGNOSIS — J3081 Allergic rhinitis due to animal (cat) (dog) hair and dander: Secondary | ICD-10-CM | POA: Diagnosis not present

## 2021-09-19 DIAGNOSIS — J301 Allergic rhinitis due to pollen: Secondary | ICD-10-CM | POA: Diagnosis not present

## 2021-09-19 DIAGNOSIS — J3081 Allergic rhinitis due to animal (cat) (dog) hair and dander: Secondary | ICD-10-CM | POA: Diagnosis not present

## 2021-09-19 DIAGNOSIS — J3089 Other allergic rhinitis: Secondary | ICD-10-CM | POA: Diagnosis not present

## 2021-09-26 DIAGNOSIS — J301 Allergic rhinitis due to pollen: Secondary | ICD-10-CM | POA: Diagnosis not present

## 2021-09-26 DIAGNOSIS — J3089 Other allergic rhinitis: Secondary | ICD-10-CM | POA: Diagnosis not present

## 2021-09-26 DIAGNOSIS — J3081 Allergic rhinitis due to animal (cat) (dog) hair and dander: Secondary | ICD-10-CM | POA: Diagnosis not present

## 2021-10-01 DIAGNOSIS — J301 Allergic rhinitis due to pollen: Secondary | ICD-10-CM | POA: Diagnosis not present

## 2021-10-01 DIAGNOSIS — J3089 Other allergic rhinitis: Secondary | ICD-10-CM | POA: Diagnosis not present

## 2021-10-01 DIAGNOSIS — J3081 Allergic rhinitis due to animal (cat) (dog) hair and dander: Secondary | ICD-10-CM | POA: Diagnosis not present

## 2021-10-09 DIAGNOSIS — J3089 Other allergic rhinitis: Secondary | ICD-10-CM | POA: Diagnosis not present

## 2021-10-09 DIAGNOSIS — J3081 Allergic rhinitis due to animal (cat) (dog) hair and dander: Secondary | ICD-10-CM | POA: Diagnosis not present

## 2021-10-09 DIAGNOSIS — J301 Allergic rhinitis due to pollen: Secondary | ICD-10-CM | POA: Diagnosis not present

## 2021-10-16 DIAGNOSIS — J301 Allergic rhinitis due to pollen: Secondary | ICD-10-CM | POA: Diagnosis not present

## 2021-10-16 DIAGNOSIS — J3089 Other allergic rhinitis: Secondary | ICD-10-CM | POA: Diagnosis not present

## 2021-10-16 DIAGNOSIS — J3081 Allergic rhinitis due to animal (cat) (dog) hair and dander: Secondary | ICD-10-CM | POA: Diagnosis not present

## 2021-10-24 DIAGNOSIS — J301 Allergic rhinitis due to pollen: Secondary | ICD-10-CM | POA: Diagnosis not present

## 2021-10-24 DIAGNOSIS — J3089 Other allergic rhinitis: Secondary | ICD-10-CM | POA: Diagnosis not present

## 2021-10-24 DIAGNOSIS — J3081 Allergic rhinitis due to animal (cat) (dog) hair and dander: Secondary | ICD-10-CM | POA: Diagnosis not present

## 2021-10-30 ENCOUNTER — Other Ambulatory Visit: Payer: Self-pay | Admitting: Nurse Practitioner

## 2021-10-30 DIAGNOSIS — I10 Essential (primary) hypertension: Secondary | ICD-10-CM

## 2021-11-01 DIAGNOSIS — J301 Allergic rhinitis due to pollen: Secondary | ICD-10-CM | POA: Diagnosis not present

## 2021-11-01 DIAGNOSIS — J3089 Other allergic rhinitis: Secondary | ICD-10-CM | POA: Diagnosis not present

## 2021-11-01 DIAGNOSIS — J3081 Allergic rhinitis due to animal (cat) (dog) hair and dander: Secondary | ICD-10-CM | POA: Diagnosis not present

## 2021-11-07 DIAGNOSIS — J301 Allergic rhinitis due to pollen: Secondary | ICD-10-CM | POA: Diagnosis not present

## 2021-11-07 DIAGNOSIS — J3089 Other allergic rhinitis: Secondary | ICD-10-CM | POA: Diagnosis not present

## 2021-11-07 DIAGNOSIS — J3081 Allergic rhinitis due to animal (cat) (dog) hair and dander: Secondary | ICD-10-CM | POA: Diagnosis not present

## 2021-11-15 DIAGNOSIS — J3081 Allergic rhinitis due to animal (cat) (dog) hair and dander: Secondary | ICD-10-CM | POA: Diagnosis not present

## 2021-11-15 DIAGNOSIS — J3089 Other allergic rhinitis: Secondary | ICD-10-CM | POA: Diagnosis not present

## 2021-11-15 DIAGNOSIS — J301 Allergic rhinitis due to pollen: Secondary | ICD-10-CM | POA: Diagnosis not present

## 2021-11-21 DIAGNOSIS — J3081 Allergic rhinitis due to animal (cat) (dog) hair and dander: Secondary | ICD-10-CM | POA: Diagnosis not present

## 2021-11-21 DIAGNOSIS — J3089 Other allergic rhinitis: Secondary | ICD-10-CM | POA: Diagnosis not present

## 2021-11-21 DIAGNOSIS — J301 Allergic rhinitis due to pollen: Secondary | ICD-10-CM | POA: Diagnosis not present

## 2021-11-28 ENCOUNTER — Encounter: Payer: Self-pay | Admitting: Nurse Practitioner

## 2021-11-28 ENCOUNTER — Ambulatory Visit (INDEPENDENT_AMBULATORY_CARE_PROVIDER_SITE_OTHER): Payer: Medicare HMO | Admitting: Nurse Practitioner

## 2021-11-28 VITALS — BP 112/60 | HR 74 | Temp 98.6°F | Ht <= 58 in | Wt 146.6 lb

## 2021-11-28 DIAGNOSIS — E782 Mixed hyperlipidemia: Secondary | ICD-10-CM

## 2021-11-28 DIAGNOSIS — I10 Essential (primary) hypertension: Secondary | ICD-10-CM | POA: Diagnosis not present

## 2021-11-28 DIAGNOSIS — J4520 Mild intermittent asthma, uncomplicated: Secondary | ICD-10-CM | POA: Diagnosis not present

## 2021-11-28 DIAGNOSIS — E559 Vitamin D deficiency, unspecified: Secondary | ICD-10-CM | POA: Diagnosis not present

## 2021-11-28 DIAGNOSIS — J301 Allergic rhinitis due to pollen: Secondary | ICD-10-CM | POA: Diagnosis not present

## 2021-11-28 DIAGNOSIS — J3081 Allergic rhinitis due to animal (cat) (dog) hair and dander: Secondary | ICD-10-CM | POA: Diagnosis not present

## 2021-11-28 DIAGNOSIS — M810 Age-related osteoporosis without current pathological fracture: Secondary | ICD-10-CM

## 2021-11-28 DIAGNOSIS — J3089 Other allergic rhinitis: Secondary | ICD-10-CM | POA: Diagnosis not present

## 2021-11-28 MED ORDER — AMLODIPINE BESYLATE 10 MG PO TABS: 10.0000 mg | ORAL_TABLET | Freq: Every day | ORAL | 2 refills | Status: DC

## 2021-11-28 NOTE — Patient Instructions (Signed)
Hypertension, Adult High blood pressure (hypertension) is when the force of blood pumping through the arteries is too strong. The arteries are the blood vessels that carry blood from the heart throughout the body. Hypertension forces the heart to work harder to pump blood and may cause arteries to become narrow or stiff. Untreated or uncontrolled hypertension can lead to a heart attack, heart failure, a stroke, kidney disease, and other problems. A blood pressure reading consists of a higher number over a lower number. Ideally, your blood pressure should be below 120/80. The first ("top") number is called the systolic pressure. It is a measure of the pressure in your arteries as your heart beats. The second ("bottom") number is called the diastolic pressure. It is a measure of the pressure in your arteries as the heart relaxes. What are the causes? The exact cause of this condition is not known. There are some conditions that result in high blood pressure. What increases the risk? Certain factors may make you more likely to develop high blood pressure. Some of these risk factors are under your control, including: Smoking. Not getting enough exercise or physical activity. Being overweight. Having too much fat, sugar, calories, or salt (sodium) in your diet. Drinking too much alcohol. Other risk factors include: Having a personal history of heart disease, diabetes, high cholesterol, or kidney disease. Stress. Having a family history of high blood pressure and high cholesterol. Having obstructive sleep apnea. Age. The risk increases with age. What are the signs or symptoms? High blood pressure may not cause symptoms. Very high blood pressure (hypertensive crisis) may cause: Headache. Fast or irregular heartbeats (palpitations). Shortness of breath. Nosebleed. Nausea and vomiting. Vision changes. Severe chest pain, dizziness, and seizures. How is this diagnosed? This condition is diagnosed by  measuring your blood pressure while you are seated, with your arm resting on a flat surface, your legs uncrossed, and your feet flat on the floor. The cuff of the blood pressure monitor will be placed directly against the skin of your upper arm at the level of your heart. Blood pressure should be measured at least twice using the same arm. Certain conditions can cause a difference in blood pressure between your right and left arms. If you have a high blood pressure reading during one visit or you have normal blood pressure with other risk factors, you may be asked to: Return on a different day to have your blood pressure checked again. Monitor your blood pressure at home for 1 week or longer. If you are diagnosed with hypertension, you may have other blood or imaging tests to help your health care provider understand your overall risk for other conditions. How is this treated? This condition is treated by making healthy lifestyle changes, such as eating healthy foods, exercising more, and reducing your alcohol intake. You may be referred for counseling on a healthy diet and physical activity. Your health care provider may prescribe medicine if lifestyle changes are not enough to get your blood pressure under control and if: Your systolic blood pressure is above 130. Your diastolic blood pressure is above 80. Your personal target blood pressure may vary depending on your medical conditions, your age, and other factors. Follow these instructions at home: Eating and drinking  Eat a diet that is high in fiber and potassium, and low in sodium, added sugar, and fat. An example of this eating plan is called the DASH diet. DASH stands for Dietary Approaches to Stop Hypertension. To eat this way: Eat   plenty of fresh fruits and vegetables. Try to fill one half of your plate at each meal with fruits and vegetables. Eat whole grains, such as whole-wheat pasta, brown rice, or whole-grain bread. Fill about one  fourth of your plate with whole grains. Eat or drink low-fat dairy products, such as skim milk or low-fat yogurt. Avoid fatty cuts of meat, processed or cured meats, and poultry with skin. Fill about one fourth of your plate with lean proteins, such as fish, chicken without skin, beans, eggs, or tofu. Avoid pre-made and processed foods. These tend to be higher in sodium, added sugar, and fat. Reduce your daily sodium intake. Many people with hypertension should eat less than 1,500 mg of sodium a day. Do not drink alcohol if: Your health care provider tells you not to drink. You are pregnant, may be pregnant, or are planning to become pregnant. If you drink alcohol: Limit how much you have to: 0-1 drink a day for women. 0-2 drinks a day for men. Know how much alcohol is in your drink. In the [place] one drink equals one 12 oz bottle of beer (355 mL), one 5 oz glass of wine (148 mL), or one 1 oz glass of hard liquor (44 mL). Lifestyle  Work with your health care provider to maintain a healthy body weight or to lose weight. Ask what an ideal weight is for you. Get at least 30 minutes of exercise that causes your heart to beat faster (aerobic exercise) most days of the week. Activities may include walking, swimming, or biking. Include exercise to strengthen your muscles (resistance exercise), such as Pilates or lifting weights, as part of your weekly exercise routine. Try to do these types of exercises for 30 minutes at least 3 days a week. Do not use any products that contain nicotine or tobacco. These products include cigarettes, chewing tobacco, and vaping devices, such as e-cigarettes. If you need help quitting, ask your health care provider. Monitor your blood pressure at home as told by your health care provider. Keep all follow-up visits. This is important. Medicines Take over-the-counter and prescription medicines only as told by your health care provider. Follow directions carefully. Blood  pressure medicines must be taken as prescribed. Do not skip doses of blood pressure medicine. Doing this puts you at risk for problems and can make the medicine less effective. Ask your health care provider about side effects or reactions to medicines that you should watch for. Contact a health care provider if you: Think you are having a reaction to a medicine you are taking. Have headaches that keep coming back (recurring). Feel dizzy. Have swelling in your ankles. Have trouble with your vision. Get help right away if you: Develop a severe headache or confusion. Have unusual weakness or numbness. Feel faint. Have severe pain in your chest or abdomen. Vomit repeatedly. Have trouble breathing. These symptoms may be an emergency. Get help right away. Call 911. Do not wait to see if the symptoms will go away. Do not drive yourself to the hospital. Summary Hypertension is when the force of blood pumping through your arteries is too strong. If this condition is not controlled, it may put you at risk for serious complications. Your personal target blood pressure may vary depending on your medical conditions, your age, and other factors. For most people, a normal blood pressure is less than 120/80. Hypertension is treated with lifestyle changes, medicines, or a combination of both. Lifestyle changes include losing weight, eating a healthy,   low-sodium diet, exercising more, and limiting alcohol. This information is not intended to replace advice given to you by your health care provider. Make sure you discuss any questions you have with your health care provider. Document Revised: 03/19/2021 Document Reviewed: 03/19/2021 Elsevier Patient Education  2023 [employer]  

## 2021-11-28 NOTE — Progress Notes (Signed)
I,Linda Moreno,acting as a Education administrator for [employer], FNP.,have documented all relevant documentation on the behalf of Linda Brine, FNP,as directed by  Linda Brine, FNP while in the presence of Linda Moreno, [place].  Subjective:     Patient ID: Linda Moreno , female    DOB: [birth year] , 76 y.o.   MRN: [id number]   Chief Complaint  Patient presents with   Hypertension    HPI  Pt presents today for a bp and cholesterol follow up. Patient has no other issues. She is scheduled to have her bone density done at [facility] next Monday.   Hypertension This is a chronic problem. The current episode started more than 1 year ago. The problem has been gradually improving since onset. The problem is controlled. Pertinent negatives include no anxiety, chest pain, headaches, palpitations or shortness of breath. There are no associated agents to hypertension. Risk factors for coronary artery disease include obesity and sedentary lifestyle. Past treatments include calcium channel blockers. The current treatment provides significant improvement. There are no compliance problems.  There is no history of angina or kidney disease. There is no history of chronic renal disease.     Past Medical History:  Diagnosis Date   Asthma    Right bundle branch block 06/25/2021     Family History  Problem Relation Age of Onset   Diabetes Father    Stroke Father    Breast cancer Neg Hx      Current Outpatient Medications:    albuterol (PROVENTIL HFA;VENTOLIN HFA) 108 (90 Base) MCG/ACT inhaler, Inhale 2 puffs into the lungs every 4 (four) hours as needed for wheezing or shortness of breath., Disp: , Rfl:    atorvastatin (LIPITOR) 10 MG tablet, TAKE 1 TABLET EVERY DAY (Patient taking differently: Take 10 mg by mouth daily.), Disp: 90 tablet, Rfl: 1   cetirizine (ZYRTEC ALLERGY) 10 MG tablet, Take 1 tablet (10 mg total) by mouth daily., Disp: 90 tablet, Rfl: 1   EPINEPHrine 0.3 mg/0.3 mL IJ SOAJ injection,  Inject into the muscle., Disp: , Rfl:    fluticasone furoate-vilanterol (BREO ELLIPTA) 100-25 MCG/INH AEPB, Inhale 1 puff into the lungs daily., Disp: , Rfl:    ibandronate (BONIVA) 150 MG tablet, TAKE 1 TABLET ORALLY ONCE MONTHLY WITH 8-10 OZ WATER. SIT UPRIGHT AND NO FOOD/DRINK FOR 1 HOUR. (Patient taking differently: Take 150 mg by mouth every 30 (thirty) days. TAKE 1 TABLET ORALLY ONCE MONTHLY WITH 8-10 OZ WATER. SIT UPRIGHT AND NO FOOD/DRINK FOR 1 HOUR.), Disp: 30 tablet, Rfl: 0   multivitamin-iron-minerals-folic acid (CENTRUM) chewable tablet, Chew 1 tablet by mouth daily., Disp: , Rfl:    amLODipine (NORVASC) 10 MG tablet, Take 1 tablet (10 mg total) by mouth daily., Disp: 90 tablet, Rfl: 2   ipratropium-albuterol (DUONEB) 0.5-2.5 (3) MG/3ML SOLN, Take 3 mLs by nebulization every 6 (six) hours as needed., Disp: 360 mL, Rfl: 0   montelukast (SINGULAIR) 10 MG tablet, Take 1 tablet (10 mg total) by mouth daily., Disp: 90 tablet, Rfl: 1   Allergies  Allergen Reactions   Shellfish Allergy      Review of Systems  Constitutional: Negative.   Respiratory: Negative.  Negative for shortness of breath.   Cardiovascular: Negative.  Negative for chest pain and palpitations.  Gastrointestinal: Negative.   Neurological: Negative.  Negative for headaches.  Psychiatric/Behavioral: Negative.       Today's Vitals   11/28/21 1143  BP: 112/60  Pulse: 74  Temp: 98.6 F (37 C)  TempSrc: Oral  Weight: 146 lb 9.6 oz (66.5 kg)  Height: 4' 9"  (1.448 m)  PainSc: 0-No pain   Body mass index is 31.72 kg/m.  Wt Readings from Last 3 Encounters:  11/28/21 146 lb 9.6 oz (66.5 kg)  07/22/21 148 lb 12.8 oz (67.5 kg)  06/25/21 146 lb 6.4 oz (66.4 kg)    Objective:  Physical Exam Vitals reviewed.  Constitutional:      General: She is not in acute distress.    Appearance: Normal appearance. She is obese.  Cardiovascular:     Rate and Rhythm: Normal rate and regular rhythm.     Pulses: Normal pulses.      Heart sounds: Normal heart sounds. No murmur heard. Pulmonary:     Effort: Pulmonary effort is normal. No respiratory distress.     Breath sounds: Normal breath sounds. No wheezing.  Skin:    Capillary Refill: Capillary refill takes less than 2 seconds.  Neurological:     General: No focal deficit present.     Mental Status: She is alert and oriented to person, place, and time.     Cranial Nerves: No cranial nerve deficit.     Motor: No weakness.  Psychiatric:        Mood and Affect: Mood normal.        Behavior: Behavior normal.        Thought Content: Thought content normal.        Judgment: Judgment normal.         Assessment And Plan:     1. Essential hypertension Comments: Blood pressure is well controlled, continue current medications.  - amLODipine (NORVASC) 10 MG tablet; Take 1 tablet (10 mg total) by mouth daily.  Dispense: 90 tablet; Refill: 2 - CMP14+EGFR  2. Mixed hyperlipidemia Comments: Continue statin, tolerating well.  - Lipid panel  3. Vitamin D deficiency Will check vitamin D level and supplement as needed.    Also encouraged to spend 15 minutes in the sun daily.  - VITAMIN D 25 Hydroxy (Vit-D Deficiency, Fractures)  4. Mild intermittent asthma without complication Comments: Currently stable, continue current medications and taking regular medications  5. Age-related osteoporosis without current pathological fracture Comments: She is scheduled to have her bone density done at [facility] next Monday.       Patient was given opportunity to ask questions. Patient verbalized understanding of the plan and was able to repeat key elements of the plan. All questions were answered to their satisfaction.  Linda Brine, FNP   I, Linda Brine, FNP, have reviewed all documentation for this visit. The documentation on 11/28/21 for the exam, diagnosis, procedures, and orders are all accurate and complete.   IF YOU HAVE BEEN REFERRED TO A SPECIALIST,  IT MAY TAKE 1-2 WEEKS TO SCHEDULE/PROCESS THE REFERRAL. IF YOU HAVE NOT HEARD FROM US/SPECIALIST IN TWO WEEKS, PLEASE GIVE [place] A CALL AT [phone].   THE PATIENT IS ENCOURAGED TO PRACTICE SOCIAL DISTANCING DUE TO THE COVID-19 PANDEMIC.

## 2021-11-29 LAB — CMP14+EGFR
ALT: 13 IU/L (ref 0–32)
AST: 21 IU/L (ref 0–40)
Albumin/Globulin Ratio: 1.3 (ref 1.2–2.2)
Albumin: 4.3 g/dL (ref 3.7–4.7)
Alkaline Phosphatase: 104 IU/L (ref 44–121)
BUN/Creatinine Ratio: 19 (ref 12–28)
BUN: 16 mg/dL (ref 8–27)
Bilirubin Total: 0.3 mg/dL (ref 0.0–1.2)
CO2: 25 mmol/L (ref 20–29)
Calcium: 9.6 mg/dL (ref 8.7–10.3)
Chloride: 104 mmol/L (ref 96–106)
Creatinine, Ser: 0.83 mg/dL (ref 0.57–1.00)
Globulin, Total: 3.4 g/dL (ref 1.5–4.5)
Glucose: 91 mg/dL (ref 70–99)
Potassium: 4.4 mmol/L (ref 3.5–5.2)
Sodium: 142 mmol/L (ref 134–144)
Total Protein: 7.7 g/dL (ref 6.0–8.5)
eGFR: 73 mL/min/{1.73_m2} (ref >59)

## 2021-11-29 LAB — LIPID PANEL
Chol/HDL Ratio: 2.2 ratio (ref 0.0–4.4)
Cholesterol, Total: 181 mg/dL (ref 100–199)
HDL: 83 mg/dL (ref >39)
LDL Chol Calc (NIH): 86 mg/dL (ref 0–99)
Triglycerides: 60 mg/dL (ref 0–149)
VLDL Cholesterol Cal: 12 mg/dL (ref 5–40)

## 2021-11-29 LAB — VITAMIN D 25 HYDROXY (VIT D DEFICIENCY, FRACTURES): Vit D, 25-Hydroxy: 51.9 ng/mL (ref 30.0–100.0)

## 2021-12-05 DIAGNOSIS — J3089 Other allergic rhinitis: Secondary | ICD-10-CM | POA: Diagnosis not present

## 2021-12-05 DIAGNOSIS — J301 Allergic rhinitis due to pollen: Secondary | ICD-10-CM | POA: Diagnosis not present

## 2021-12-05 DIAGNOSIS — J3081 Allergic rhinitis due to animal (cat) (dog) hair and dander: Secondary | ICD-10-CM | POA: Diagnosis not present

## 2021-12-09 DIAGNOSIS — M13851 Other specified arthritis, right hip: Secondary | ICD-10-CM | POA: Diagnosis not present

## 2021-12-09 DIAGNOSIS — M816 Localized osteoporosis [Lequesne]: Secondary | ICD-10-CM | POA: Diagnosis not present

## 2021-12-09 DIAGNOSIS — N958 Other specified menopausal and perimenopausal disorders: Secondary | ICD-10-CM | POA: Diagnosis not present

## 2021-12-09 DIAGNOSIS — Z7983 Long term (current) use of bisphosphonates: Secondary | ICD-10-CM | POA: Diagnosis not present

## 2021-12-12 DIAGNOSIS — J301 Allergic rhinitis due to pollen: Secondary | ICD-10-CM | POA: Diagnosis not present

## 2021-12-12 DIAGNOSIS — J3081 Allergic rhinitis due to animal (cat) (dog) hair and dander: Secondary | ICD-10-CM | POA: Diagnosis not present

## 2021-12-12 DIAGNOSIS — J3089 Other allergic rhinitis: Secondary | ICD-10-CM | POA: Diagnosis not present

## 2021-12-20 DIAGNOSIS — J3081 Allergic rhinitis due to animal (cat) (dog) hair and dander: Secondary | ICD-10-CM | POA: Diagnosis not present

## 2021-12-20 DIAGNOSIS — J301 Allergic rhinitis due to pollen: Secondary | ICD-10-CM | POA: Diagnosis not present

## 2021-12-20 DIAGNOSIS — J3089 Other allergic rhinitis: Secondary | ICD-10-CM | POA: Diagnosis not present

## 2021-12-20 DIAGNOSIS — Z91013 Allergy to seafood: Secondary | ICD-10-CM | POA: Diagnosis not present

## 2021-12-20 DIAGNOSIS — J4530 Mild persistent asthma, uncomplicated: Secondary | ICD-10-CM | POA: Diagnosis not present

## 2021-12-26 DIAGNOSIS — J3081 Allergic rhinitis due to animal (cat) (dog) hair and dander: Secondary | ICD-10-CM | POA: Diagnosis not present

## 2021-12-26 DIAGNOSIS — J301 Allergic rhinitis due to pollen: Secondary | ICD-10-CM | POA: Diagnosis not present

## 2021-12-26 DIAGNOSIS — J3089 Other allergic rhinitis: Secondary | ICD-10-CM | POA: Diagnosis not present

## 2022-01-02 ENCOUNTER — Other Ambulatory Visit: Payer: Self-pay

## 2022-01-02 DIAGNOSIS — I10 Essential (primary) hypertension: Secondary | ICD-10-CM

## 2022-01-02 MED ORDER — AMLODIPINE BESYLATE 10 MG PO TABS: 10.0000 mg | ORAL_TABLET | Freq: Every day | ORAL | 2 refills | Status: DC

## 2022-01-03 DIAGNOSIS — J301 Allergic rhinitis due to pollen: Secondary | ICD-10-CM | POA: Diagnosis not present

## 2022-01-03 DIAGNOSIS — J3081 Allergic rhinitis due to animal (cat) (dog) hair and dander: Secondary | ICD-10-CM | POA: Diagnosis not present

## 2022-01-03 DIAGNOSIS — J3089 Other allergic rhinitis: Secondary | ICD-10-CM | POA: Diagnosis not present

## 2022-01-09 DIAGNOSIS — J3089 Other allergic rhinitis: Secondary | ICD-10-CM | POA: Diagnosis not present

## 2022-01-09 DIAGNOSIS — J301 Allergic rhinitis due to pollen: Secondary | ICD-10-CM | POA: Diagnosis not present

## 2022-01-09 DIAGNOSIS — J3081 Allergic rhinitis due to animal (cat) (dog) hair and dander: Secondary | ICD-10-CM | POA: Diagnosis not present

## 2022-01-17 DIAGNOSIS — J3081 Allergic rhinitis due to animal (cat) (dog) hair and dander: Secondary | ICD-10-CM | POA: Diagnosis not present

## 2022-01-17 DIAGNOSIS — J301 Allergic rhinitis due to pollen: Secondary | ICD-10-CM | POA: Diagnosis not present

## 2022-01-17 DIAGNOSIS — J3089 Other allergic rhinitis: Secondary | ICD-10-CM | POA: Diagnosis not present

## 2022-01-31 DIAGNOSIS — J3081 Allergic rhinitis due to animal (cat) (dog) hair and dander: Secondary | ICD-10-CM | POA: Diagnosis not present

## 2022-01-31 DIAGNOSIS — J301 Allergic rhinitis due to pollen: Secondary | ICD-10-CM | POA: Diagnosis not present

## 2022-01-31 DIAGNOSIS — J3089 Other allergic rhinitis: Secondary | ICD-10-CM | POA: Diagnosis not present

## 2022-02-06 DIAGNOSIS — J3089 Other allergic rhinitis: Secondary | ICD-10-CM | POA: Diagnosis not present

## 2022-02-06 DIAGNOSIS — J301 Allergic rhinitis due to pollen: Secondary | ICD-10-CM | POA: Diagnosis not present

## 2022-02-06 DIAGNOSIS — J3081 Allergic rhinitis due to animal (cat) (dog) hair and dander: Secondary | ICD-10-CM | POA: Diagnosis not present

## 2022-02-12 DIAGNOSIS — Z01419 Encounter for gynecological examination (general) (routine) without abnormal findings: Secondary | ICD-10-CM | POA: Diagnosis not present

## 2022-02-12 DIAGNOSIS — J301 Allergic rhinitis due to pollen: Secondary | ICD-10-CM | POA: Diagnosis not present

## 2022-02-12 DIAGNOSIS — J3089 Other allergic rhinitis: Secondary | ICD-10-CM | POA: Diagnosis not present

## 2022-02-12 DIAGNOSIS — Z6831 Body mass index (BMI) 31.0-31.9, adult: Secondary | ICD-10-CM | POA: Diagnosis not present

## 2022-02-12 DIAGNOSIS — J3081 Allergic rhinitis due to animal (cat) (dog) hair and dander: Secondary | ICD-10-CM | POA: Diagnosis not present

## 2022-02-13 DIAGNOSIS — J3081 Allergic rhinitis due to animal (cat) (dog) hair and dander: Secondary | ICD-10-CM | POA: Diagnosis not present

## 2022-02-13 DIAGNOSIS — J3089 Other allergic rhinitis: Secondary | ICD-10-CM | POA: Diagnosis not present

## 2022-02-13 DIAGNOSIS — J301 Allergic rhinitis due to pollen: Secondary | ICD-10-CM | POA: Diagnosis not present

## 2022-02-21 DIAGNOSIS — J301 Allergic rhinitis due to pollen: Secondary | ICD-10-CM | POA: Diagnosis not present

## 2022-02-21 DIAGNOSIS — J3089 Other allergic rhinitis: Secondary | ICD-10-CM | POA: Diagnosis not present

## 2022-02-21 DIAGNOSIS — J3081 Allergic rhinitis due to animal (cat) (dog) hair and dander: Secondary | ICD-10-CM | POA: Diagnosis not present

## 2022-02-21 DIAGNOSIS — Z23 Encounter for immunization: Secondary | ICD-10-CM | POA: Diagnosis not present

## 2022-02-28 DIAGNOSIS — J3089 Other allergic rhinitis: Secondary | ICD-10-CM | POA: Diagnosis not present

## 2022-02-28 DIAGNOSIS — J301 Allergic rhinitis due to pollen: Secondary | ICD-10-CM | POA: Diagnosis not present

## 2022-02-28 DIAGNOSIS — J3081 Allergic rhinitis due to animal (cat) (dog) hair and dander: Secondary | ICD-10-CM | POA: Diagnosis not present

## 2022-03-07 DIAGNOSIS — J3089 Other allergic rhinitis: Secondary | ICD-10-CM | POA: Diagnosis not present

## 2022-03-07 DIAGNOSIS — J3081 Allergic rhinitis due to animal (cat) (dog) hair and dander: Secondary | ICD-10-CM | POA: Diagnosis not present

## 2022-03-07 DIAGNOSIS — J301 Allergic rhinitis due to pollen: Secondary | ICD-10-CM | POA: Diagnosis not present

## 2022-03-13 DIAGNOSIS — J3089 Other allergic rhinitis: Secondary | ICD-10-CM | POA: Diagnosis not present

## 2022-03-13 DIAGNOSIS — J301 Allergic rhinitis due to pollen: Secondary | ICD-10-CM | POA: Diagnosis not present

## 2022-03-13 DIAGNOSIS — J3081 Allergic rhinitis due to animal (cat) (dog) hair and dander: Secondary | ICD-10-CM | POA: Diagnosis not present

## 2022-03-21 DIAGNOSIS — J301 Allergic rhinitis due to pollen: Secondary | ICD-10-CM | POA: Diagnosis not present

## 2022-03-21 DIAGNOSIS — J3081 Allergic rhinitis due to animal (cat) (dog) hair and dander: Secondary | ICD-10-CM | POA: Diagnosis not present

## 2022-03-21 DIAGNOSIS — J3089 Other allergic rhinitis: Secondary | ICD-10-CM | POA: Diagnosis not present

## 2022-04-03 DIAGNOSIS — J3081 Allergic rhinitis due to animal (cat) (dog) hair and dander: Secondary | ICD-10-CM | POA: Diagnosis not present

## 2022-04-03 DIAGNOSIS — J301 Allergic rhinitis due to pollen: Secondary | ICD-10-CM | POA: Diagnosis not present

## 2022-04-03 DIAGNOSIS — J3089 Other allergic rhinitis: Secondary | ICD-10-CM | POA: Diagnosis not present

## 2022-04-11 DIAGNOSIS — J301 Allergic rhinitis due to pollen: Secondary | ICD-10-CM | POA: Diagnosis not present

## 2022-04-11 DIAGNOSIS — J3081 Allergic rhinitis due to animal (cat) (dog) hair and dander: Secondary | ICD-10-CM | POA: Diagnosis not present

## 2022-04-11 DIAGNOSIS — J3089 Other allergic rhinitis: Secondary | ICD-10-CM | POA: Diagnosis not present

## 2022-04-14 ENCOUNTER — Encounter: Payer: Self-pay | Admitting: Nurse Practitioner

## 2022-04-14 ENCOUNTER — Ambulatory Visit (INDEPENDENT_AMBULATORY_CARE_PROVIDER_SITE_OTHER): Payer: Medicare HMO | Admitting: Nurse Practitioner

## 2022-04-14 VITALS — BP 128/78 | HR 79 | Temp 98.4°F | Ht <= 58 in | Wt 144.0 lb

## 2022-04-14 DIAGNOSIS — Z79899 Other long term (current) drug therapy: Secondary | ICD-10-CM | POA: Diagnosis not present

## 2022-04-14 DIAGNOSIS — I700 Atherosclerosis of aorta: Secondary | ICD-10-CM | POA: Diagnosis not present

## 2022-04-14 DIAGNOSIS — E782 Mixed hyperlipidemia: Secondary | ICD-10-CM | POA: Diagnosis not present

## 2022-04-14 DIAGNOSIS — E6609 Other obesity due to excess calories: Secondary | ICD-10-CM | POA: Diagnosis not present

## 2022-04-14 DIAGNOSIS — Z0000 Encounter for general adult medical examination without abnormal findings: Secondary | ICD-10-CM | POA: Diagnosis not present

## 2022-04-14 DIAGNOSIS — E559 Vitamin D deficiency, unspecified: Secondary | ICD-10-CM

## 2022-04-14 DIAGNOSIS — I10 Essential (primary) hypertension: Secondary | ICD-10-CM

## 2022-04-14 DIAGNOSIS — Z6831 Body mass index (BMI) 31.0-31.9, adult: Secondary | ICD-10-CM | POA: Diagnosis not present

## 2022-04-14 LAB — POCT URINALYSIS DIPSTICK
Bilirubin, UA: NEGATIVE
Blood, UA: NEGATIVE
Glucose, UA: NEGATIVE
Ketones, UA: NEGATIVE
Nitrite, UA: NEGATIVE
Protein, UA: NEGATIVE
Spec Grav, UA: >=1.03 — AB (ref 1.010–1.025)
Urobilinogen, UA: 0.2 E.U./dL
pH, UA: 5.5 (ref 5.0–8.0)

## 2022-04-14 NOTE — Progress Notes (Signed)
I,Tianna Badgett,acting as a Education administrator for [employer], FNP.,have documented all relevant documentation on the behalf of [name], FNP,as directed by  [name], FNP while in the presence of [name], [place]. Subjective:     Patient ID: Linda Moreno , female    DOB: 09/06/1945 , 76 y.o.   MRN: [id number]   Chief Complaint  Patient presents with   Annual Exam    HPI  Here for HM. She had her PAP done in September.      Past Medical History:  Diagnosis Date   Asthma    Right bundle branch block 06/25/2021     Family History  Problem Relation Age of Onset   Diabetes Father    Stroke Father    Breast cancer Neg Hx      Current Outpatient Medications:    albuterol (PROVENTIL HFA;VENTOLIN HFA) 108 (90 Base) MCG/ACT inhaler, Inhale 2 puffs into the lungs every 4 (four) hours as needed for wheezing or shortness of breath., Disp: , Rfl:    amLODipine (NORVASC) 10 MG tablet, Take 1 tablet (10 mg total) by mouth daily., Disp: 90 tablet, Rfl: 2   atorvastatin (LIPITOR) 10 MG tablet, TAKE 1 TABLET EVERY DAY (Patient taking differently: Take 10 mg by mouth daily.), Disp: 90 tablet, Rfl: 1   cetirizine (ZYRTEC ALLERGY) 10 MG tablet, Take 1 tablet (10 mg total) by mouth daily., Disp: 90 tablet, Rfl: 1   EPINEPHrine 0.3 mg/0.3 mL IJ SOAJ injection, Inject into the muscle., Disp: , Rfl:    fluticasone furoate-vilanterol (BREO ELLIPTA) 100-25 MCG/INH AEPB, Inhale 1 puff into the lungs daily., Disp: , Rfl:    ibandronate (BONIVA) 150 MG tablet, TAKE 1 TABLET ORALLY ONCE MONTHLY WITH 8-10 OZ WATER. SIT UPRIGHT AND NO FOOD/DRINK FOR 1 HOUR. (Patient taking differently: Take 150 mg by mouth every 30 (thirty) days. TAKE 1 TABLET ORALLY ONCE MONTHLY WITH 8-10 OZ WATER. SIT UPRIGHT AND NO FOOD/DRINK FOR 1 HOUR.), Disp: 30 tablet, Rfl: 0   multivitamin-iron-minerals-folic acid (CENTRUM) chewable tablet, Chew 1 tablet by mouth daily., Disp: , Rfl:    Allergies  Allergen Reactions   Shellfish  Allergy       The patient states she is  post menopausal status.  No LMP recorded. Patient is postmenopausal.  Negative for Dysmenorrhea and Negative for Menorrhagia. Negative for: breast discharge, breast lump(s), breast pain and breast self exam. Associated symptoms include abnormal vaginal bleeding. Pertinent negatives include abnormal bleeding (hematology), anxiety, decreased libido, depression, difficulty falling sleep, dyspareunia, history of infertility, nocturia, sexual dysfunction, sleep disturbances, urinary incontinence, urinary urgency, vaginal discharge and vaginal itching. Diet regular - low salt and low fat. The patient states her exercise level is minimum - 2 days a week and she is caring for her 65 year old grandchild.    . The patient's tobacco use is:  Social History   Tobacco Use  Smoking Status Never  Smokeless Tobacco Never  . She has been exposed to passive smoke. The patient's alcohol use is:  Social History   Substance and Sexual Activity  Alcohol Use No   Additional information: Last pap patient reports this year with Dr. Army Fossa.    Review of Systems  Constitutional: Negative.   HENT: Negative.    Eyes: Negative.   Respiratory: Negative.    Cardiovascular: Negative.   Gastrointestinal: Negative.   Endocrine: Negative.   Genitourinary: Negative.   Musculoskeletal: Negative.   Skin: Negative.   Allergic/Immunologic: Negative.   Neurological: Negative.  Hematological: Negative.   Psychiatric/Behavioral: Negative.       Today's Vitals   04/14/22 1439  BP: 128/78  Pulse: 79  Temp: 98.4 F (36.9 C)  TempSrc: Oral  Weight: 144 lb (65.3 kg)  Height: _0  (1.448 m)   Body mass index is 31.16 kg/m.  Wt Readings from Last 3 Encounters:  04/14/22 144 lb (65.3 kg)  11/28/21 146 lb 9.6 oz (66.5 kg)  07/22/21 148 lb 12.8 oz (67.5 kg)    Objective:  Physical Exam Vitals reviewed.  Constitutional:      General: She is not in acute distress.     Appearance: Normal appearance. She is well-developed. She is obese.  HENT:     Head: Normocephalic and atraumatic.     Right Ear: Hearing, tympanic membrane, ear canal and external ear normal. There is no impacted cerumen.     Left Ear: Hearing, tympanic membrane, ear canal and external ear normal. There is no impacted cerumen.     Nose:     Comments: Deferred - masked    Mouth/Throat:     Comments: Deferred - masked Eyes:     General: Lids are normal.     Extraocular Movements: Extraocular movements intact.     Conjunctiva/sclera: Conjunctivae normal.     Pupils: Pupils are equal, round, and reactive to light.     Funduscopic exam:    Right eye: No papilledema.        Left eye: No papilledema.  Neck:     Thyroid: No thyroid mass.     Vascular: No carotid bruit.  Cardiovascular:     Rate and Rhythm: Normal rate and regular rhythm.     Pulses: Normal pulses.     Heart sounds: Normal heart sounds. No murmur heard. Pulmonary:     Effort: Pulmonary effort is normal. No respiratory distress.     Breath sounds: Normal breath sounds. No wheezing.  Chest:     Chest wall: No mass.  Breasts:    Tanner Score is 5.     Right: Normal. No swelling, mass or tenderness.     Left: Normal. No swelling, mass or tenderness.  Abdominal:     General: Abdomen is flat. Bowel sounds are normal. There is no distension.     Palpations: Abdomen is soft.     Tenderness: There is no abdominal tenderness.  Genitourinary:    Comments: Deferred - seen by GYN Musculoskeletal:        General: No swelling or tenderness. Normal range of motion.     Cervical back: Full passive range of motion without pain, normal range of motion and neck supple.     Right lower leg: No edema.     Left lower leg: No edema.     Comments: Right hip decreased range of motion  Lymphadenopathy:     Upper Body:     Right upper body: No supraclavicular, axillary or pectoral adenopathy.     Left upper body: No supraclavicular,  axillary or pectoral adenopathy.  Skin:    General: Skin is warm and dry.     Capillary Refill: Capillary refill takes less than 2 seconds.  Neurological:     General: No focal deficit present.     Mental Status: She is alert and oriented to person, place, and time.     Cranial Nerves: No cranial nerve deficit.     Sensory: No sensory deficit.     Motor: No weakness.  Psychiatric:  Mood and Affect: Mood normal.        Behavior: Behavior normal.        Thought Content: Thought content normal.        Judgment: Judgment normal.         Assessment And Plan:     1. Encounter for annual physical exam Behavior modifications discussed and diet history reviewed.   Pt will continue to exercise regularly and modify diet with low GI, plant based foods and decrease intake of processed foods.  Recommend intake of daily multivitamin, Vitamin D, and calcium.  Recommend mammogram and colonoscopy for preventive screenings, as well as recommend immunizations that include influenza, TDAP, and Shingles (up to date). She is due for her pneumonia in February 2024  2. Class 1 obesity due to excess calories with serious comorbidity and body mass index (BMI) of 31.0 to 31.9 in adult She is encouraged to strive for BMI less than 30 to decrease cardiac risk. Advised to aim for at least 150 minutes of exercise per week. - Hemoglobin A1c  3. Essential hypertension Comments: Blood pressure is well controlled, continue current medications. - CMP14+EGFR - POCT Urinalysis Dipstick (81002) - Microalbumin / Creatinine Urine Ratio  4. Mixed hyperlipidemia Comments: Cholesterol levels are stable, continue statin, tolerating well. - CMP14+EGFR - Lipid panel  5. Vitamin D deficiency Will check vitamin D level and supplement as needed.    Also encouraged to spend 15 minutes in the sun daily.   6. Aortic atherosclerosis (HCC) Comments: Continue statin, tolerating well.  7. Other long term (current) drug  therapy - CBC   Patient was given opportunity to ask questions. Patient verbalized understanding of the plan and was able to repeat key elements of the plan. All questions were answered to their satisfaction.   [name], FNP   I, [name], FNP, have reviewed all documentation for this visit. The documentation on 04/14/22 for the exam, diagnosis, procedures, and orders are all accurate and complete.   THE PATIENT IS ENCOURAGED TO PRACTICE SOCIAL DISTANCING DUE TO THE COVID-19 PANDEMIC.

## 2022-04-14 NOTE — Patient Instructions (Signed)

## 2022-04-15 LAB — MICROALBUMIN / CREATININE URINE RATIO
Creatinine, Urine: 145 mg/dL
Microalb/Creat Ratio: 7 mg/g creat (ref 0–29)
Microalbumin, Urine: 10.7 ug/mL

## 2022-04-15 LAB — CBC
Hematocrit: 39.5 % (ref 34.0–46.6)
Hemoglobin: 13.4 g/dL (ref 11.1–15.9)
MCH: 29.7 pg (ref 26.6–33.0)
MCHC: 33.9 g/dL (ref 31.5–35.7)
MCV: 88 fL (ref 79–97)
Platelets: 262 10*3/uL (ref 150–450)
RBC: 4.51 x10E6/uL (ref 3.77–5.28)
RDW: 12 % (ref 11.7–15.4)
WBC: 5.1 10*3/uL (ref 3.4–10.8)

## 2022-04-15 LAB — CMP14+EGFR
ALT: 16 IU/L (ref 0–32)
AST: 25 IU/L (ref 0–40)
Albumin/Globulin Ratio: 1.3 (ref 1.2–2.2)
Albumin: 4.5 g/dL (ref 3.8–4.8)
Alkaline Phosphatase: 96 IU/L (ref 44–121)
BUN/Creatinine Ratio: 20 (ref 12–28)
BUN: 15 mg/dL (ref 8–27)
Bilirubin Total: 0.5 mg/dL (ref 0.0–1.2)
CO2: 24 mmol/L (ref 20–29)
Calcium: 9.7 mg/dL (ref 8.7–10.3)
Chloride: 102 mmol/L (ref 96–106)
Creatinine, Ser: 0.75 mg/dL (ref 0.57–1.00)
Globulin, Total: 3.4 g/dL (ref 1.5–4.5)
Glucose: 89 mg/dL (ref 70–99)
Potassium: 4.2 mmol/L (ref 3.5–5.2)
Sodium: 140 mmol/L (ref 134–144)
Total Protein: 7.9 g/dL (ref 6.0–8.5)
eGFR: 82 mL/min/{1.73_m2} (ref >59)

## 2022-04-15 LAB — LIPID PANEL
Chol/HDL Ratio: 2.2 ratio (ref 0.0–4.4)
Cholesterol, Total: 159 mg/dL (ref 100–199)
HDL: 73 mg/dL (ref >39)
LDL Chol Calc (NIH): 73 mg/dL (ref 0–99)
Triglycerides: 66 mg/dL (ref 0–149)
VLDL Cholesterol Cal: 13 mg/dL (ref 5–40)

## 2022-04-15 LAB — HEMOGLOBIN A1C
Est. average glucose Bld gHb Est-mCnc: 111 mg/dL
Hgb A1c MFr Bld: 5.5 % (ref 4.8–5.6)

## 2022-04-19 LAB — HM DEXA SCAN

## 2022-04-23 ENCOUNTER — Encounter: Payer: Medicare HMO | Admitting: Nurse Practitioner

## 2022-04-25 DIAGNOSIS — J301 Allergic rhinitis due to pollen: Secondary | ICD-10-CM | POA: Diagnosis not present

## 2022-04-25 DIAGNOSIS — J3081 Allergic rhinitis due to animal (cat) (dog) hair and dander: Secondary | ICD-10-CM | POA: Diagnosis not present

## 2022-04-25 DIAGNOSIS — J3089 Other allergic rhinitis: Secondary | ICD-10-CM | POA: Diagnosis not present

## 2022-05-02 DIAGNOSIS — J301 Allergic rhinitis due to pollen: Secondary | ICD-10-CM | POA: Diagnosis not present

## 2022-05-02 DIAGNOSIS — J3081 Allergic rhinitis due to animal (cat) (dog) hair and dander: Secondary | ICD-10-CM | POA: Diagnosis not present

## 2022-05-02 DIAGNOSIS — J3089 Other allergic rhinitis: Secondary | ICD-10-CM | POA: Diagnosis not present

## 2022-05-05 ENCOUNTER — Other Ambulatory Visit: Payer: Self-pay | Admitting: Nurse Practitioner

## 2022-05-05 DIAGNOSIS — E782 Mixed hyperlipidemia: Secondary | ICD-10-CM

## 2022-05-09 DIAGNOSIS — J3089 Other allergic rhinitis: Secondary | ICD-10-CM | POA: Diagnosis not present

## 2022-05-09 DIAGNOSIS — J301 Allergic rhinitis due to pollen: Secondary | ICD-10-CM | POA: Diagnosis not present

## 2022-05-09 DIAGNOSIS — J3081 Allergic rhinitis due to animal (cat) (dog) hair and dander: Secondary | ICD-10-CM | POA: Diagnosis not present

## 2022-05-14 DIAGNOSIS — J301 Allergic rhinitis due to pollen: Secondary | ICD-10-CM | POA: Diagnosis not present

## 2022-05-14 DIAGNOSIS — J3089 Other allergic rhinitis: Secondary | ICD-10-CM | POA: Diagnosis not present

## 2022-05-14 DIAGNOSIS — J3081 Allergic rhinitis due to animal (cat) (dog) hair and dander: Secondary | ICD-10-CM | POA: Diagnosis not present

## 2022-05-28 DIAGNOSIS — J3089 Other allergic rhinitis: Secondary | ICD-10-CM | POA: Diagnosis not present

## 2022-05-28 DIAGNOSIS — J301 Allergic rhinitis due to pollen: Secondary | ICD-10-CM | POA: Diagnosis not present

## 2022-05-28 DIAGNOSIS — J3081 Allergic rhinitis due to animal (cat) (dog) hair and dander: Secondary | ICD-10-CM | POA: Diagnosis not present

## 2022-06-06 ENCOUNTER — Other Ambulatory Visit: Payer: Self-pay | Admitting: Nurse Practitioner

## 2022-06-06 DIAGNOSIS — Z1231 Encounter for screening mammogram for malignant neoplasm of breast: Secondary | ICD-10-CM

## 2022-06-11 DIAGNOSIS — J3081 Allergic rhinitis due to animal (cat) (dog) hair and dander: Secondary | ICD-10-CM | POA: Diagnosis not present

## 2022-06-11 DIAGNOSIS — J301 Allergic rhinitis due to pollen: Secondary | ICD-10-CM | POA: Diagnosis not present

## 2022-06-11 DIAGNOSIS — J3089 Other allergic rhinitis: Secondary | ICD-10-CM | POA: Diagnosis not present

## 2022-06-18 DIAGNOSIS — J301 Allergic rhinitis due to pollen: Secondary | ICD-10-CM | POA: Diagnosis not present

## 2022-06-18 DIAGNOSIS — J3081 Allergic rhinitis due to animal (cat) (dog) hair and dander: Secondary | ICD-10-CM | POA: Diagnosis not present

## 2022-06-18 DIAGNOSIS — J3089 Other allergic rhinitis: Secondary | ICD-10-CM | POA: Diagnosis not present

## 2022-06-25 ENCOUNTER — Ambulatory Visit (INDEPENDENT_AMBULATORY_CARE_PROVIDER_SITE_OTHER): Payer: Medicare HMO

## 2022-06-25 VITALS — Ht <= 58 in | Wt 147.0 lb

## 2022-06-25 DIAGNOSIS — Z0000 Encounter for general adult medical examination without abnormal findings: Secondary | ICD-10-CM

## 2022-06-25 NOTE — Patient Instructions (Signed)
Linda Moreno , Thank you for taking time to come for your Medicare Wellness Visit. I appreciate your ongoing commitment to your health goals. Please review the following plan we discussed and let me know if I can assist you in the future.   These are the goals we discussed:  Goals       Patient Stated      10/11/2020, wants to weigh 125 pounds      Patient Stated      06/19/2021, wants to weigh 130 pounds      Patient Stated      06/25/2022, wants to lose weight and stay healthy      Weight (lb) < 200 lb (90.7 kg) (pt-stated)      Wants to lose 15 pounds      Weight (lb) < 200 lb (90.7 kg)      04/06/2019, wants to lose 10 pounds      Weight (lb) < 200 lb (90.7 kg)      07/07/2019, wants to lose 20 pounds        This is a list of the screening recommended for you and due dates:  Health Maintenance  Topic Date Due   DEXA scan (bone density measurement)  11/13/2021   COVID-19 Vaccine (4 - 2023-24 season) 01/24/2022   Pneumonia Vaccine (2 - PCV) 06/28/2022   Medicare Annual Wellness Visit  06/26/2023   Colon Cancer Screening  06/25/2025   DTaP/Tdap/Td vaccine (3 - Td or Tdap) 02/16/2028   Flu Shot  Completed   Hepatitis C Screening: USPSTF Recommendation to screen - Ages 18-79 yo.  Completed   Zoster (Shingles) Vaccine  Completed   HPV Vaccine  Aged Out    Advanced directives: Advance directive discussed with you today.   Conditions/risks identified: none  Next appointment: Follow up in one year for your annual wellness visit    Preventive Care 65 Years and Older, Female Preventive care refers to lifestyle choices and visits with your health care provider that can promote health and wellness. What does preventive care include? A yearly physical exam. This is also called an annual well check. Dental exams once or twice a year. Routine eye exams. Ask your health care provider how often you should have your eyes checked. Personal lifestyle choices, including: Daily care of  your teeth and gums. Regular physical activity. Eating a healthy diet. Avoiding tobacco and drug use. Limiting alcohol use. Practicing safe sex. Taking low-dose aspirin every day. Taking vitamin and mineral supplements as recommended by your health care provider. What happens during an annual well check? The services and screenings done by your health care provider during your annual well check will depend on your age, overall health, lifestyle risk factors, and family history of disease. Counseling  Your health care provider may ask you questions about your: Alcohol use. Tobacco use. Drug use. Emotional well-being. Home and relationship well-being. Sexual activity. Eating habits. History of falls. Memory and ability to understand (cognition). Work and work Statistician. Reproductive health. Screening  You may have the following tests or measurements: Height, weight, and BMI. Blood pressure. Lipid and cholesterol levels. These may be checked every 5 years, or more frequently if you are over 95 years old. Skin check. Lung cancer screening. You may have this screening every year starting at age 45 if you have a 30-pack-year history of smoking and currently smoke or have quit within the past 15 years. Fecal occult blood test (FOBT) of the stool. You may have  this test every year starting at age 65. Flexible sigmoidoscopy or colonoscopy. You may have a sigmoidoscopy every 5 years or a colonoscopy every 10 years starting at age 67. Hepatitis C blood test. Hepatitis B blood test. Sexually transmitted disease (STD) testing. Diabetes screening. This is done by checking your blood sugar (glucose) after you have not eaten for a while (fasting). You may have this done every 1-3 years. Bone density scan. This is done to screen for osteoporosis. You may have this done starting at age 44. Mammogram. This may be done every 1-2 years. Talk to your health care provider about how often you should  have regular mammograms. Talk with your health care provider about your test results, treatment options, and if necessary, the need for more tests. Vaccines  Your health care provider may recommend certain vaccines, such as: Influenza vaccine. This is recommended every year. Tetanus, diphtheria, and acellular pertussis (Tdap, Td) vaccine. You may need a Td booster every 10 years. Zoster vaccine. You may need this after age 52. Pneumococcal 13-valent conjugate (PCV13) vaccine. One dose is recommended after age 48. Pneumococcal polysaccharide (PPSV23) vaccine. One dose is recommended after age 74. Talk to your health care provider about which screenings and vaccines you need and how often you need them. This information is not intended to replace advice given to you by your health care provider. Make sure you discuss any questions you have with your health care provider. Document Released: 06/08/2015 Document Revised: 01/30/2016 Document Reviewed: 03/13/2015 Elsevier Interactive Patient Education  2017 [employer] Prevention in the Home Falls can cause injuries. They can happen to people of all ages. There are many things you can do to make your home safe and to help prevent falls. What can I do on the outside of my home? Regularly fix the edges of walkways and driveways and fix any cracks. Remove anything that might make you trip as you walk through a door, such as a raised step or threshold. Trim any bushes or trees on the path to your home. Use bright outdoor lighting. Clear any walking paths of anything that might make someone trip, such as rocks or tools. Regularly check to see if handrails are loose or broken. Make sure that both sides of any steps have handrails. Any raised decks and porches should have guardrails on the edges. Have any leaves, snow, or ice cleared regularly. Use sand or salt on walking paths during winter. Clean up any spills in your garage right away. This  includes oil or grease spills. What can I do in the bathroom? Use night lights. Install grab bars by the toilet and in the tub and shower. Do not use towel bars as grab bars. Use non-skid mats or decals in the tub or shower. If you need to sit down in the shower, use a plastic, non-slip stool. Keep the floor dry. Clean up any water that spills on the floor as soon as it happens. Remove soap buildup in the tub or shower regularly. Attach bath mats securely with double-sided non-slip rug tape. Do not have throw rugs and other things on the floor that can make you trip. What can I do in the bedroom? Use night lights. Make sure that you have a light by your bed that is easy to reach. Do not use any sheets or blankets that are too big for your bed. They should not hang down onto the floor. Have a firm chair that has side arms. You  can use this for support while you get dressed. Do not have throw rugs and other things on the floor that can make you trip. What can I do in the kitchen? Clean up any spills right away. Avoid walking on wet floors. Keep items that you use a lot in easy-to-reach places. If you need to reach something above you, use a strong step stool that has a grab bar. Keep electrical cords out of the way. Do not use floor polish or wax that makes floors slippery. If you must use wax, use non-skid floor wax. Do not have throw rugs and other things on the floor that can make you trip. What can I do with my stairs? Do not leave any items on the stairs. Make sure that there are handrails on both sides of the stairs and use them. Fix handrails that are broken or loose. Make sure that handrails are as long as the stairways. Check any carpeting to make sure that it is firmly attached to the stairs. Fix any carpet that is loose or worn. Avoid having throw rugs at the top or bottom of the stairs. If you do have throw rugs, attach them to the floor with carpet tape. Make sure that you  have a light switch at the top of the stairs and the bottom of the stairs. If you do not have them, ask someone to add them for you. What else can I do to help prevent falls? Wear shoes that: Do not have high heels. Have rubber bottoms. Are comfortable and fit you well. Are closed at the toe. Do not wear sandals. If you use a stepladder: Make sure that it is fully opened. Do not climb a closed stepladder. Make sure that both sides of the stepladder are locked into place. Ask someone to hold it for you, if possible. Clearly mark and make sure that you can see: Any grab bars or handrails. First and last steps. Where the edge of each step is. Use tools that help you move around (mobility aids) if they are needed. These include: Canes. Walkers. Scooters. Crutches. Turn on the lights when you go into a dark area. Replace any light bulbs as soon as they burn out. Set up your furniture so you have a clear path. Avoid moving your furniture around. If any of your floors are uneven, fix them. If there are any pets around you, be aware of where they are. Review your medicines with your doctor. Some medicines can make you feel dizzy. This can increase your chance of falling. Ask your doctor what other things that you can do to help prevent falls. This information is not intended to replace advice given to you by your health care provider. Make sure you discuss any questions you have with your health care provider. Document Released: 03/08/2009 Document Revised: 10/18/2015 Document Reviewed: 06/16/2014 Elsevier Interactive Patient Education  2017 [employer].

## 2022-06-25 NOTE — Progress Notes (Signed)
I connected with Linda Moreno today by telephone and verified that I am speaking with the correct person using two identifiers. Location patient: home Location provider: work Persons participating in the virtual visit: Linda Moreno, Linda Moreno.   I discussed the limitations, risks, security and privacy concerns of performing an evaluation and management service by telephone and the availability of in person appointments. I also discussed with the patient that there may be a patient responsible charge related to this service. The patient expressed understanding and verbally consented to this telephonic visit.    Interactive audio and video telecommunications were attempted between this provider and patient, however failed, due to patient having technical difficulties OR patient did not have access to video capability.  We continued and completed visit with audio only.     Vital signs may be patient reported or missing.  Subjective:   Linda Moreno is a 77 y.o. female who presents for Medicare Annual (Subsequent) preventive examination.  Review of Systems     Cardiac Risk Factors include: advanced age (>94mn, >>35women);dyslipidemia;hypertension;obesity (BMI >30kg/m2)     Objective:    Today's Vitals   06/25/22 1133  Weight: 147 lb (66.7 kg)  Height: '4\' 9"'$  (1.448 m)   Body mass index is 31.81 kg/m.     06/25/2022   11:35 AM 06/22/2021   10:10 PM 06/19/2021   11:20 AM 06/17/2021    6:32 PM 10/11/2020    2:46 PM 07/07/2019    2:29 PM 04/06/2019   11:32 AM  Advanced Directives  Does Patient Have a Medical Advance Directive? No No No No No Yes No  Type of ATeacher, early years/preLiving will   Copy of [place] Chart?      No - copy requested   Would patient like information on creating a medical advance directive?  No - Patient declined  No - Patient declined       Current Medications (verified) Outpatient Encounter  Medications as of 06/25/2022  Medication Sig   albuterol (PROVENTIL HFA;VENTOLIN HFA) 108 (90 Base) MCG/ACT inhaler Inhale 2 puffs into the lungs every 4 (four) hours as needed for wheezing or shortness of breath.   amLODipine (NORVASC) 10 MG tablet Take 1 tablet (10 mg total) by mouth daily.   atorvastatin (LIPITOR) 10 MG tablet Take 1 tablet (10 mg total) by mouth daily.   cetirizine (ZYRTEC ALLERGY) 10 MG tablet Take 1 tablet (10 mg total) by mouth daily.   EPINEPHrine 0.3 mg/0.3 mL IJ SOAJ injection Inject into the muscle.   fluticasone furoate-vilanterol (BREO ELLIPTA) 100-25 MCG/INH AEPB Inhale 1 puff into the lungs daily.   ibandronate (BONIVA) 150 MG tablet TAKE 1 TABLET ORALLY ONCE MONTHLY WITH 8-10 OZ WATER. SIT UPRIGHT AND NO FOOD/DRINK FOR 1 HOUR. (Patient taking differently: Take 150 mg by mouth every 30 (thirty) days. TAKE 1 TABLET ORALLY ONCE MONTHLY WITH 8-10 OZ WATER. SIT UPRIGHT AND NO FOOD/DRINK FOR 1 HOUR.)   multivitamin-iron-minerals-folic acid (CENTRUM) chewable tablet Chew 1 tablet by mouth daily.   No facility-administered encounter medications on file as of 06/25/2022.    Allergies (verified) Shellfish allergy   History: Past Medical History:  Diagnosis Date   Asthma    Right bundle branch block 06/25/2021   History reviewed. No pertinent surgical history. Family History  Problem Relation Age of Onset   Diabetes Father    Stroke Father    Breast cancer Neg Hx  Social History   Socioeconomic History   Marital status: Married    Spouse name: Not on file   Number of children: Not on file   Years of education: Not on file   Highest education level: Not on file  Occupational History   Occupation: retired  Tobacco Use   Smoking status: Never   Smokeless tobacco: Never  Vaping Use   Vaping Use: Never used  Substance and Sexual Activity   Alcohol use: No   Drug use: No   Sexual activity: Yes  Other Topics Concern   Not on file  Social History  Narrative   Not on file   Social Determinants of Health   Financial Resource Strain: Low Risk  (06/25/2022)   Overall Financial Resource Strain (CARDIA)    Difficulty of Paying Living Expenses: Not hard at all  Food Insecurity: No Food Insecurity (06/25/2022)   Hunger Vital Sign    Worried About Running Out of Food in the Last Year: Never true    [place] in the Last Year: Never true  Transportation Needs: No Transportation Needs (06/25/2022)   PRAPARE - Hydrologist (Medical): No    Lack of Transportation (Non-Medical): No  Physical Activity: Inactive (06/25/2022)   Exercise Vital Sign    Days of Exercise per Week: 0 days    Minutes of Exercise per Session: 0 min  Stress: No Stress Concern Present (06/25/2022)   [place]    Feeling of Stress : Not at all  Social Connections: Not on file    Tobacco Counseling Counseling given: Not Answered   Clinical Intake:  Pre-visit preparation completed: Yes  Pain : No/denies pain     Nutritional Status: BMI > 30  Obese Nutritional Risks: None Diabetes: No  How often do you need to have someone help you when you read instructions, pamphlets, or other written materials from your doctor or pharmacy?: 1 - Never  Diabetic? no  Interpreter Needed?: No  Information entered by :: NAllen Moreno   Activities of Daily Living    06/25/2022   11:36 AM  In your present state of health, do you have any difficulty performing the following activities:  Hearing? 0  Vision? 0  Difficulty concentrating or making decisions? 0  Walking or climbing stairs? 0  Dressing or bathing? 0  Doing errands, shopping? 0  Preparing Food and eating ? N  Using the Toilet? N  In the past six months, have you accidently leaked urine? N  Do you have problems with loss of bowel control? N  Managing your Medications? N  Managing your Finances? N  Housekeeping  or managing your Housekeeping? N    Patient Care Team: Linda Brine, FNP as PCP - General (General Practice)  Indicate any recent Medical Services you may have received from other than [facility] providers in the past year (date may be approximate).     Assessment:   This is a routine wellness examination for Linda Moreno.  Hearing/Vision screen Vision Screening - Comments:: No regular eye exams, Linda Moreno  Dietary issues and exercise activities discussed: Current Exercise Habits: The patient does not participate in regular exercise at present   Goals Addressed             This Visit's Progress    Patient Stated       06/25/2022, wants to lose weight and stay healthy  Depression Screen    06/25/2022   11:36 AM 11/28/2021   11:42 AM 06/19/2021   11:21 AM 10/11/2020    2:46 PM 10/08/2020   11:28 AM 07/07/2019    2:30 PM 07/07/2019    2:09 PM  PHQ 2/9 Scores  PHQ - 2 Score 0 0 0 0 0 0 0  PHQ- 9 Score      0     Fall Risk    06/25/2022   11:36 AM 11/28/2021   11:42 AM 06/19/2021   11:20 AM 10/11/2020    2:46 PM 10/08/2020   11:28 AM  Fall Risk   Falls in the past year? 0 0 0 0 0  Number falls in past yr: 0 0     Injury with Fall? 0 0     Risk for fall due to : Medication side effect  Medication side effect Medication side effect   Follow up Falls prevention discussed;Education provided;Falls evaluation completed Falls evaluation completed Falls evaluation completed;Education provided;Falls prevention discussed Falls evaluation completed;Education provided;Falls prevention discussed     FALL RISK PREVENTION PERTAINING TO THE HOME:  Any stairs in or around the home? Yes  If so, are there any without handrails? No  Home free of loose throw rugs in walkways, pet beds, electrical cords, etc? Yes  Adequate lighting in your home to reduce risk of falls? Yes   ASSISTIVE DEVICES UTILIZED TO PREVENT FALLS:  Life alert? No  Use of a cane, walker or w/c? No  Grab bars in  the bathroom? No  Shower chair or bench in shower? No  Elevated toilet seat or a handicapped toilet? Yes   TIMED UP AND GO:  Was the test performed? No .  .     Cognitive Function:        06/25/2022   11:37 AM 06/19/2021   11:22 AM 10/11/2020    2:48 PM 07/07/2019    2:31 PM 04/06/2019   11:37 AM  6CIT Screen  What Year? 0 points 0 points 0 points 0 points 0 points  What month? 0 points 0 points 0 points 0 points 0 points  What time? 0 points 0 points 0 points 0 points 0 points  Count back from 20 0 points 0 points 0 points 0 points 0 points  Months in reverse 0 points 0 points 2 points 0 points 0 points  Repeat phrase 2 points 0 points 0 points 2 points 8 points  Total Score 2 points 0 points 2 points 2 points 8 points    Immunizations Immunization History  Administered Date(s) Administered   Influenza, High Dose Seasonal PF 05/18/2015, 04/21/2016, 06/05/2017, 02/15/2018, 06/14/2018, 01/07/2019, 03/12/2020, 03/20/2021   Influenza-Unspecified 01/07/2019, 03/27/2020, 03/20/2021   PFIZER(Purple Top)SARS-COV-2 Vaccination 07/09/2019, 08/13/2019, 12/21/2019   Pneumococcal Polysaccharide-23 06/05/2017, 06/14/2018, 12/21/2019, 12/21/2020, 06/28/2021   Tdap 02/15/2018, 02/15/2018   Zoster Recombinat (Shingrix) 08/12/2017, 12/28/2017    TDAP status: Up to date  Flu Vaccine status: Up to date  Pneumococcal vaccine status: Up to date  Covid-19 vaccine status: Completed vaccines  Qualifies for Shingles Vaccine? Yes   Zostavax completed Yes   Shingrix Completed?: Yes  Screening Tests Health Maintenance  Topic Date Due   DEXA SCAN  11/13/2021   COVID-19 Vaccine (4 - 2023-24 season) 01/24/2022   Medicare Annual Wellness (AWV)  06/19/2022   Pneumonia Vaccine 37+ Years old (2 - PCV) 06/28/2022   COLONOSCOPY (Pts 45-1yr Insurance coverage will need to be confirmed)  06/25/2025   DTaP/Tdap/Td (3 -  Td or Tdap) 02/16/2028   INFLUENZA VACCINE  Completed   Hepatitis C  Screening  Completed   Zoster Vaccines- Shingrix  Completed   HPV VACCINES  Aged Out    Health Maintenance  Health Maintenance Due  Topic Date Due   DEXA SCAN  11/13/2021   COVID-19 Vaccine (4 - 2023-24 season) 01/24/2022   Medicare Annual Wellness (AWV)  06/19/2022   Pneumonia Vaccine 74+ Years old (2 - PCV) 06/28/2022    Colorectal cancer screening: Type of screening: Colonoscopy. Completed 06/25/2020. Repeat every 5 years  Mammogram status: scheduled for 07/29/2022  Bone Density status: Completed 11/14/2019.   Lung Cancer Screening: (Low Dose CT Chest recommended if Age 18-80 years, 30 pack-year currently smoking OR have quit w/in 15years.) does not qualify.   Lung Cancer Screening Referral: no  Additional Screening:  Hepatitis C Screening: does qualify; Completed 01/10/2019  Vision Screening: Recommended annual ophthalmology exams for early detection of glaucoma and other disorders of the eye. Is the patient up to date with their annual eye exam?  Yes  Who is the provider or what is the name of the office in which the patient attends annual eye exams? Linda  If pt is not established with a provider, would they like to be referred to a provider to establish care? No .   Dental Screening: Recommended annual dental exams for proper oral hygiene  Community Resource Referral / Chronic Care Management: CRR required this visit?  No   CCM required this visit?  No      Plan:     I have personally reviewed and noted the following in the patient's chart:   Medical and social history Use of alcohol, tobacco or illicit drugs  Current medications and supplements including opioid prescriptions. Patient is not currently taking opioid prescriptions. Functional ability and status Nutritional status Physical activity Advanced directives List of other physicians Hospitalizations, surgeries, and ER visits in previous 12 months Vitals Screenings to include cognitive, depression,  and falls Referrals and appointments  In addition, I have reviewed and discussed with patient certain preventive protocols, quality metrics, and best practice recommendations. A written personalized care plan for preventive services as well as general preventive health recommendations were provided to patient.     Kellie Simmering, Moreno   [id number]   Nurse Notes: none  Due to this being a virtual visit, the after visit summary with patients personalized plan was offered to patient via mail or my-chart.  to pick up at office at next visit

## 2022-06-26 DIAGNOSIS — J3081 Allergic rhinitis due to animal (cat) (dog) hair and dander: Secondary | ICD-10-CM | POA: Diagnosis not present

## 2022-06-26 DIAGNOSIS — J301 Allergic rhinitis due to pollen: Secondary | ICD-10-CM | POA: Diagnosis not present

## 2022-06-26 DIAGNOSIS — J3089 Other allergic rhinitis: Secondary | ICD-10-CM | POA: Diagnosis not present

## 2022-07-03 DIAGNOSIS — J301 Allergic rhinitis due to pollen: Secondary | ICD-10-CM | POA: Diagnosis not present

## 2022-07-03 DIAGNOSIS — J3081 Allergic rhinitis due to animal (cat) (dog) hair and dander: Secondary | ICD-10-CM | POA: Diagnosis not present

## 2022-07-03 DIAGNOSIS — J3089 Other allergic rhinitis: Secondary | ICD-10-CM | POA: Diagnosis not present

## 2022-07-10 DIAGNOSIS — J3089 Other allergic rhinitis: Secondary | ICD-10-CM | POA: Diagnosis not present

## 2022-07-10 DIAGNOSIS — J3081 Allergic rhinitis due to animal (cat) (dog) hair and dander: Secondary | ICD-10-CM | POA: Diagnosis not present

## 2022-07-10 DIAGNOSIS — J301 Allergic rhinitis due to pollen: Secondary | ICD-10-CM | POA: Diagnosis not present

## 2022-07-16 DIAGNOSIS — J3089 Other allergic rhinitis: Secondary | ICD-10-CM | POA: Diagnosis not present

## 2022-07-16 DIAGNOSIS — J3081 Allergic rhinitis due to animal (cat) (dog) hair and dander: Secondary | ICD-10-CM | POA: Diagnosis not present

## 2022-07-16 DIAGNOSIS — J301 Allergic rhinitis due to pollen: Secondary | ICD-10-CM | POA: Diagnosis not present

## 2022-07-25 DIAGNOSIS — J301 Allergic rhinitis due to pollen: Secondary | ICD-10-CM | POA: Diagnosis not present

## 2022-07-25 DIAGNOSIS — J3089 Other allergic rhinitis: Secondary | ICD-10-CM | POA: Diagnosis not present

## 2022-07-25 DIAGNOSIS — J3081 Allergic rhinitis due to animal (cat) (dog) hair and dander: Secondary | ICD-10-CM | POA: Diagnosis not present

## 2022-07-29 ENCOUNTER — Ambulatory Visit
Admission: RE | Admit: 2022-07-29 | Discharge: 2022-07-29 | Disposition: A | Discharge status: Home / Self Care | Payer: Medicare HMO | Source: Ambulatory Visit | Attending: Nurse Practitioner | Admitting: Nurse Practitioner

## 2022-07-29 DIAGNOSIS — Z1231 Encounter for screening mammogram for malignant neoplasm of breast: Secondary | ICD-10-CM

## 2022-07-31 DIAGNOSIS — J3081 Allergic rhinitis due to animal (cat) (dog) hair and dander: Secondary | ICD-10-CM | POA: Diagnosis not present

## 2022-07-31 DIAGNOSIS — J3089 Other allergic rhinitis: Secondary | ICD-10-CM | POA: Diagnosis not present

## 2022-07-31 DIAGNOSIS — J301 Allergic rhinitis due to pollen: Secondary | ICD-10-CM | POA: Diagnosis not present

## 2022-08-07 DIAGNOSIS — J3081 Allergic rhinitis due to animal (cat) (dog) hair and dander: Secondary | ICD-10-CM | POA: Diagnosis not present

## 2022-08-07 DIAGNOSIS — J3089 Other allergic rhinitis: Secondary | ICD-10-CM | POA: Diagnosis not present

## 2022-08-07 DIAGNOSIS — J301 Allergic rhinitis due to pollen: Secondary | ICD-10-CM | POA: Diagnosis not present

## 2022-08-14 DIAGNOSIS — J3089 Other allergic rhinitis: Secondary | ICD-10-CM | POA: Diagnosis not present

## 2022-08-14 DIAGNOSIS — J301 Allergic rhinitis due to pollen: Secondary | ICD-10-CM | POA: Diagnosis not present

## 2022-08-14 DIAGNOSIS — J3081 Allergic rhinitis due to animal (cat) (dog) hair and dander: Secondary | ICD-10-CM | POA: Diagnosis not present

## 2022-08-21 DIAGNOSIS — J301 Allergic rhinitis due to pollen: Secondary | ICD-10-CM | POA: Diagnosis not present

## 2022-08-21 DIAGNOSIS — J3089 Other allergic rhinitis: Secondary | ICD-10-CM | POA: Diagnosis not present

## 2022-08-21 DIAGNOSIS — J3081 Allergic rhinitis due to animal (cat) (dog) hair and dander: Secondary | ICD-10-CM | POA: Diagnosis not present

## 2022-09-05 DIAGNOSIS — J3081 Allergic rhinitis due to animal (cat) (dog) hair and dander: Secondary | ICD-10-CM | POA: Diagnosis not present

## 2022-09-05 DIAGNOSIS — J301 Allergic rhinitis due to pollen: Secondary | ICD-10-CM | POA: Diagnosis not present

## 2022-09-05 DIAGNOSIS — J3089 Other allergic rhinitis: Secondary | ICD-10-CM | POA: Diagnosis not present

## 2022-09-11 DIAGNOSIS — J301 Allergic rhinitis due to pollen: Secondary | ICD-10-CM | POA: Diagnosis not present

## 2022-09-11 DIAGNOSIS — J3081 Allergic rhinitis due to animal (cat) (dog) hair and dander: Secondary | ICD-10-CM | POA: Diagnosis not present

## 2022-09-11 DIAGNOSIS — J3089 Other allergic rhinitis: Secondary | ICD-10-CM | POA: Diagnosis not present

## 2022-09-18 DIAGNOSIS — J3081 Allergic rhinitis due to animal (cat) (dog) hair and dander: Secondary | ICD-10-CM | POA: Diagnosis not present

## 2022-09-18 DIAGNOSIS — J301 Allergic rhinitis due to pollen: Secondary | ICD-10-CM | POA: Diagnosis not present

## 2022-09-18 DIAGNOSIS — J3089 Other allergic rhinitis: Secondary | ICD-10-CM | POA: Diagnosis not present

## 2022-09-25 ENCOUNTER — Other Ambulatory Visit: Payer: Self-pay | Admitting: Nurse Practitioner

## 2022-09-25 DIAGNOSIS — J301 Allergic rhinitis due to pollen: Secondary | ICD-10-CM | POA: Diagnosis not present

## 2022-09-25 DIAGNOSIS — I10 Essential (primary) hypertension: Secondary | ICD-10-CM

## 2022-09-25 DIAGNOSIS — J3089 Other allergic rhinitis: Secondary | ICD-10-CM | POA: Diagnosis not present

## 2022-09-25 DIAGNOSIS — J3081 Allergic rhinitis due to animal (cat) (dog) hair and dander: Secondary | ICD-10-CM | POA: Diagnosis not present

## 2022-10-09 DIAGNOSIS — J301 Allergic rhinitis due to pollen: Secondary | ICD-10-CM | POA: Diagnosis not present

## 2022-10-09 DIAGNOSIS — J3081 Allergic rhinitis due to animal (cat) (dog) hair and dander: Secondary | ICD-10-CM | POA: Diagnosis not present

## 2022-10-09 DIAGNOSIS — J3089 Other allergic rhinitis: Secondary | ICD-10-CM | POA: Diagnosis not present

## 2022-10-13 ENCOUNTER — Ambulatory Visit: Payer: Medicare HMO | Admitting: Nurse Practitioner

## 2022-10-16 DIAGNOSIS — J3089 Other allergic rhinitis: Secondary | ICD-10-CM | POA: Diagnosis not present

## 2022-10-16 DIAGNOSIS — J3081 Allergic rhinitis due to animal (cat) (dog) hair and dander: Secondary | ICD-10-CM | POA: Diagnosis not present

## 2022-10-16 DIAGNOSIS — J301 Allergic rhinitis due to pollen: Secondary | ICD-10-CM | POA: Diagnosis not present

## 2022-10-17 ENCOUNTER — Other Ambulatory Visit: Payer: Self-pay | Admitting: Nurse Practitioner

## 2022-10-17 DIAGNOSIS — E782 Mixed hyperlipidemia: Secondary | ICD-10-CM

## 2022-10-21 ENCOUNTER — Ambulatory Visit (INDEPENDENT_AMBULATORY_CARE_PROVIDER_SITE_OTHER): Payer: Medicare HMO | Admitting: Nurse Practitioner

## 2022-10-21 ENCOUNTER — Encounter: Payer: Self-pay | Admitting: Nurse Practitioner

## 2022-10-21 VITALS — BP 130/64 | HR 70 | Temp 98.4°F | Ht <= 58 in | Wt 146.2 lb

## 2022-10-21 DIAGNOSIS — Z1159 Encounter for screening for other viral diseases: Secondary | ICD-10-CM | POA: Diagnosis not present

## 2022-10-21 DIAGNOSIS — Z2821 Immunization not carried out because of patient refusal: Secondary | ICD-10-CM

## 2022-10-21 DIAGNOSIS — E782 Mixed hyperlipidemia: Secondary | ICD-10-CM

## 2022-10-21 DIAGNOSIS — I10 Essential (primary) hypertension: Secondary | ICD-10-CM | POA: Diagnosis not present

## 2022-10-21 MED ORDER — IBANDRONATE SODIUM 150 MG PO TABS: ORAL_TABLET | ORAL | 0 refills | Status: AC

## 2022-10-21 NOTE — Patient Instructions (Signed)
Hypertension, Adult Hypertension is another name for high blood pressure. High blood pressure forces your heart to work harder to pump blood. This can cause problems over time. There are two numbers in a blood pressure reading. There is a top number (systolic) over a bottom number (diastolic). It is best to have a blood pressure that is below 120/80. What are the causes? The cause of this condition is not known. Some other conditions can lead to high blood pressure. What increases the risk? Some lifestyle factors can make you more likely to develop high blood pressure: Smoking. Not getting enough exercise or physical activity. Being overweight. Having too much fat, sugar, calories, or salt (sodium) in your diet. Drinking too much alcohol. Other risk factors include: Having any of these conditions: Heart disease. Diabetes. High cholesterol. Kidney disease. Obstructive sleep apnea. Having a family history of high blood pressure and high cholesterol. Age. The risk increases with age. Stress. What are the signs or symptoms? High blood pressure may not cause symptoms. Very high blood pressure (hypertensive crisis) may cause: Headache. Fast or uneven heartbeats (palpitations). Shortness of breath. Nosebleed. Vomiting or feeling like you may vomit (nauseous). Changes in how you see. Very bad chest pain. Feeling dizzy. Seizures. How is this treated? This condition is treated by making healthy lifestyle changes, such as: Eating healthy foods. Exercising more. Drinking less alcohol. Your doctor may prescribe medicine if lifestyle changes do not help enough and if: Your top number is above 130. Your bottom number is above 80. Your personal target blood pressure may vary. Follow these instructions at home: Eating and drinking  If told, follow the DASH eating plan. To follow this plan: Fill one half of your plate at each meal with fruits and vegetables. Fill one fourth of your plate  at each meal with whole grains. Whole grains include whole-wheat pasta, brown rice, and whole-grain bread. Eat or drink low-fat dairy products, such as skim milk or low-fat yogurt. Fill one fourth of your plate at each meal with low-fat (lean) proteins. Low-fat proteins include fish, chicken without skin, eggs, beans, and tofu. Avoid fatty meat, cured and processed meat, or chicken with skin. Avoid pre-made or processed food. Limit the amount of salt in your diet to less than 1,500 mg each day. Do not drink alcohol if: Your doctor tells you not to drink. You are pregnant, may be pregnant, or are planning to become pregnant. If you drink alcohol: Limit how much you have to: 0-1 drink a day for women. 0-2 drinks a day for men. Know how much alcohol is in your drink. In the [place] one drink equals one 12 oz bottle of beer (355 mL), one 5 oz glass of wine (148 mL), or one 1 oz glass of hard liquor (44 mL). Lifestyle  Work with your doctor to stay at a healthy weight or to lose weight. Ask your doctor what the best weight is for you. Get at least 30 minutes of exercise that causes your heart to beat faster (aerobic exercise) most days of the week. This may include walking, swimming, or biking. Get at least 30 minutes of exercise that strengthens your muscles (resistance exercise) at least 3 days a week. This may include lifting weights or doing Pilates. Do not smoke or use any products that contain nicotine or tobacco. If you need help quitting, ask your doctor. Check your blood pressure at home as told by your doctor. Keep all follow-up visits. Medicines Take over-the-counter and prescription medicines   only as told by your doctor. Follow directions carefully. Do not skip doses of blood pressure medicine. The medicine does not work as well if you skip doses. Skipping doses also puts you at risk for problems. Ask your doctor about side effects or reactions to medicines that you should watch  for. Contact a doctor if: You think you are having a reaction to the medicine you are taking. You have headaches that keep coming back. You feel dizzy. You have swelling in your ankles. You have trouble with your vision. Get help right away if: You get a very bad headache. You start to feel mixed up (confused). You feel weak or numb. You feel faint. You have very bad pain in your: Chest. Belly (abdomen). You vomit more than once. You have trouble breathing. These symptoms may be an emergency. Get help right away. Call 911. Do not wait to see if the symptoms will go away. Do not drive yourself to the hospital. Summary Hypertension is another name for high blood pressure. High blood pressure forces your heart to work harder to pump blood. For most people, a normal blood pressure is less than 120/80. Making healthy choices can help lower blood pressure. If your blood pressure does not get lower with healthy choices, you may need to take medicine. This information is not intended to replace advice given to you by your health care provider. Make sure you discuss any questions you have with your health care provider. Document Revised: 02/28/2021 Document Reviewed: 02/28/2021 Elsevier Patient Education  2024 Elsevier Inc.  

## 2022-10-21 NOTE — Progress Notes (Signed)
Hershal Coria Martin,acting as a Neurosurgeon for Arnette Felts, FNP.,have documented all relevant documentation on the behalf of Arnette Felts, FNP,as directed by  Arnette Felts, FNP while in the presence of Arnette Felts, FNP.    Subjective:     Patient ID: Linda Moreno , female    DOB: Mar 03, 1946 , 77 y.o.   MRN: [id number]   Chief Complaint  Patient presents with   Hypertension    HPI  Patient presents today for a BP check, patient reports compliance with medications and has no other concerns today. She is helping to care for her 80 month old grandchild who keeps her busy.   She is scheduled to go for her eye exam on June 11th.      Past Medical History:  Diagnosis Date   Asthma    Right bundle branch block 06/25/2021     Family History  Problem Relation Age of Onset   Diabetes Father    Stroke Father    Breast cancer Neg Hx      Current Outpatient Medications:    albuterol (PROVENTIL HFA;VENTOLIN HFA) 108 (90 Base) MCG/ACT inhaler, Inhale 2 puffs into the lungs every 4 (four) hours as needed for wheezing or shortness of breath., Disp: , Rfl:    amLODipine (NORVASC) 10 MG tablet, TAKE 1 TABLET EVERY DAY, Disp: 90 tablet, Rfl: 3   atorvastatin (LIPITOR) 10 MG tablet, TAKE 1 TABLET EVERY DAY, Disp: 90 tablet, Rfl: 3   cetirizine (ZYRTEC ALLERGY) 10 MG tablet, Take 1 tablet (10 mg total) by mouth daily., Disp: 90 tablet, Rfl: 1   EPINEPHrine 0.3 mg/0.3 mL IJ SOAJ injection, Inject into the muscle., Disp: , Rfl:    fluticasone furoate-vilanterol (BREO ELLIPTA) 100-25 MCG/INH AEPB, Inhale 1 puff into the lungs daily., Disp: , Rfl:    multivitamin-iron-minerals-folic acid (CENTRUM) chewable tablet, Chew 1 tablet by mouth daily., Disp: , Rfl:    ibandronate (BONIVA) 150 MG tablet, TAKE 1 TABLET ORALLY ONCE MONTHLY WITH 8-10 OZ WATER. SIT UPRIGHT AND NO FOOD/DRINK FOR 1 HOUR., Disp: 30 tablet, Rfl: 0   Allergies  Allergen Reactions   Shellfish Allergy      Review of Systems   Constitutional: Negative.   Respiratory: Negative.  Negative for shortness of breath.   Cardiovascular: Negative.  Negative for chest pain and palpitations.  Gastrointestinal: Negative.   Neurological: Negative.  Negative for headaches.  Psychiatric/Behavioral: Negative.       Today's Vitals   10/21/22 0845  BP: 130/64  Pulse: 70  Temp: 98.4 F (36.9 C)  TempSrc: Oral  Weight: 146 lb 3.2 oz (66.3 kg)  Height: 4\' 9"  (1.448 m)  PainSc: 0-No pain   Body mass index is 31.64 kg/m.  The 10-year ASCVD risk score (Arnett DK, et al., 2019) is: 17%   Values used to calculate the score:     Age: 84 years     Sex: Female     Is Non-Hispanic African American: Yes     Diabetic: No     Tobacco smoker: No     Systolic Blood Pressure: 130 mmHg     Is BP treated: Yes     HDL Cholesterol: 73 mg/dL     Total Cholesterol: 159 mg/dL  Objective:  Physical Exam Vitals reviewed.  Constitutional:      General: She is not in acute distress.    Appearance: Normal appearance. She is obese.  Cardiovascular:     Rate and Rhythm: Normal rate and regular rhythm.  Pulses: Normal pulses.     Heart sounds: Normal heart sounds. No murmur heard. Pulmonary:     Effort: Pulmonary effort is normal. No respiratory distress.     Breath sounds: Normal breath sounds. No wheezing.  Skin:    Capillary Refill: Capillary refill takes less than 2 seconds.  Neurological:     General: No focal deficit present.     Mental Status: She is alert and oriented to person, place, and time.     Cranial Nerves: No cranial nerve deficit.     Motor: No weakness.  Psychiatric:        Mood and Affect: Mood normal.        Behavior: Behavior normal.        Thought Content: Thought content normal.        Judgment: Judgment normal.         Assessment And Plan:     1. Essential hypertension Comments: Blood pressure is controlled with systolic slightly elevated, continue current medications and lifestyle  modifications. - Basic metabolic panel  2. Mixed hyperlipidemia Comments: Cholesterol levels are stable. Continue current medications as directed, tolerating well. - Lipid panel  3. COVID-19 vaccination declined Declines covid 19 vaccine. Discussed risk of covid 31 and if she changes her mind about the vaccine to call the office. Education has been provided regarding the importance of this vaccine but patient still declined. Advised may receive this vaccine at local pharmacy or Health Dept.or vaccine clinic. Aware to provide a copy of the vaccination record if obtained from local pharmacy or Health Dept.  Encouraged to take multivitamin, vitamin d, vitamin c and zinc to increase immune system. Aware can call office if would like to have vaccine here at office. Verbalized acceptance and understanding.  4. Encounter for hepatitis C screening test for low risk patient Will check Hepatitis C screening due to recent recommendations to screen all adults 18 years and older - Hepatitis C antibody    Return for 6 month bp check.   Patient was given opportunity to ask questions. Patient verbalized understanding of the plan and was able to repeat key elements of the plan. All questions were answered to their satisfaction.  Arnette Felts, FNP   I, Arnette Felts, FNP, have reviewed all documentation for this visit. The documentation on 10/21/22 for the exam, diagnosis, procedures, and orders are all accurate and complete.   IF YOU HAVE BEEN REFERRED TO A SPECIALIST, IT MAY TAKE 1-2 WEEKS TO SCHEDULE/PROCESS THE REFERRAL. IF YOU HAVE NOT HEARD FROM US/SPECIALIST IN TWO WEEKS, PLEASE GIVE [place] A CALL AT [phone] X [phone].   THE PATIENT IS ENCOURAGED TO PRACTICE SOCIAL DISTANCING DUE TO THE COVID-19 PANDEMIC.

## 2022-10-22 LAB — LIPID PANEL
Chol/HDL Ratio: 2.2 ratio (ref 0.0–4.4)
Cholesterol, Total: 167 mg/dL (ref 100–199)
HDL: 77 mg/dL (ref >39)
LDL Chol Calc (NIH): 82 mg/dL (ref 0–99)
Triglycerides: 37 mg/dL (ref 0–149)
VLDL Cholesterol Cal: 8 mg/dL (ref 5–40)

## 2022-10-22 LAB — BASIC METABOLIC PANEL
BUN/Creatinine Ratio: 21 (ref 12–28)
BUN: 15 mg/dL (ref 8–27)
CO2: 23 mmol/L (ref 20–29)
Calcium: 8.8 mg/dL (ref 8.7–10.3)
Chloride: 104 mmol/L (ref 96–106)
Creatinine, Ser: 0.72 mg/dL (ref 0.57–1.00)
Glucose: 97 mg/dL (ref 70–99)
Potassium: 4.3 mmol/L (ref 3.5–5.2)
Sodium: 142 mmol/L (ref 134–144)
eGFR: 86 mL/min/{1.73_m2} (ref >59)

## 2022-10-22 LAB — HEPATITIS C ANTIBODY: Hep C Virus Ab: NONREACTIVE

## 2022-10-23 DIAGNOSIS — J301 Allergic rhinitis due to pollen: Secondary | ICD-10-CM | POA: Diagnosis not present

## 2022-10-23 DIAGNOSIS — J3089 Other allergic rhinitis: Secondary | ICD-10-CM | POA: Diagnosis not present

## 2022-10-23 DIAGNOSIS — J3081 Allergic rhinitis due to animal (cat) (dog) hair and dander: Secondary | ICD-10-CM | POA: Diagnosis not present

## 2022-10-31 DIAGNOSIS — J301 Allergic rhinitis due to pollen: Secondary | ICD-10-CM | POA: Diagnosis not present

## 2022-10-31 DIAGNOSIS — J3081 Allergic rhinitis due to animal (cat) (dog) hair and dander: Secondary | ICD-10-CM | POA: Diagnosis not present

## 2022-10-31 DIAGNOSIS — J3089 Other allergic rhinitis: Secondary | ICD-10-CM | POA: Diagnosis not present

## 2022-11-04 DIAGNOSIS — H25813 Combined forms of age-related cataract, bilateral: Secondary | ICD-10-CM | POA: Diagnosis not present

## 2022-11-06 DIAGNOSIS — J3081 Allergic rhinitis due to animal (cat) (dog) hair and dander: Secondary | ICD-10-CM | POA: Diagnosis not present

## 2022-11-06 DIAGNOSIS — J3089 Other allergic rhinitis: Secondary | ICD-10-CM | POA: Diagnosis not present

## 2022-11-06 DIAGNOSIS — J301 Allergic rhinitis due to pollen: Secondary | ICD-10-CM | POA: Diagnosis not present

## 2022-11-13 DIAGNOSIS — J3089 Other allergic rhinitis: Secondary | ICD-10-CM | POA: Diagnosis not present

## 2022-11-13 DIAGNOSIS — J301 Allergic rhinitis due to pollen: Secondary | ICD-10-CM | POA: Diagnosis not present

## 2022-11-13 DIAGNOSIS — J3081 Allergic rhinitis due to animal (cat) (dog) hair and dander: Secondary | ICD-10-CM | POA: Diagnosis not present

## 2022-11-13 DIAGNOSIS — Z0100 Encounter for examination of eyes and vision without abnormal findings: Secondary | ICD-10-CM | POA: Diagnosis not present

## 2022-11-21 DIAGNOSIS — J3081 Allergic rhinitis due to animal (cat) (dog) hair and dander: Secondary | ICD-10-CM | POA: Diagnosis not present

## 2022-11-21 DIAGNOSIS — J3089 Other allergic rhinitis: Secondary | ICD-10-CM | POA: Diagnosis not present

## 2022-11-21 DIAGNOSIS — J301 Allergic rhinitis due to pollen: Secondary | ICD-10-CM | POA: Diagnosis not present

## 2022-11-26 DIAGNOSIS — J301 Allergic rhinitis due to pollen: Secondary | ICD-10-CM | POA: Diagnosis not present

## 2022-11-26 DIAGNOSIS — J3081 Allergic rhinitis due to animal (cat) (dog) hair and dander: Secondary | ICD-10-CM | POA: Diagnosis not present

## 2022-11-26 DIAGNOSIS — J3089 Other allergic rhinitis: Secondary | ICD-10-CM | POA: Diagnosis not present

## 2022-12-10 DIAGNOSIS — J3081 Allergic rhinitis due to animal (cat) (dog) hair and dander: Secondary | ICD-10-CM | POA: Diagnosis not present

## 2022-12-10 DIAGNOSIS — J3089 Other allergic rhinitis: Secondary | ICD-10-CM | POA: Diagnosis not present

## 2022-12-10 DIAGNOSIS — J301 Allergic rhinitis due to pollen: Secondary | ICD-10-CM | POA: Diagnosis not present

## 2022-12-15 DIAGNOSIS — J3089 Other allergic rhinitis: Secondary | ICD-10-CM | POA: Diagnosis not present

## 2022-12-15 DIAGNOSIS — Z91013 Allergy to seafood: Secondary | ICD-10-CM | POA: Diagnosis not present

## 2022-12-15 DIAGNOSIS — J3081 Allergic rhinitis due to animal (cat) (dog) hair and dander: Secondary | ICD-10-CM | POA: Diagnosis not present

## 2022-12-15 DIAGNOSIS — J4530 Mild persistent asthma, uncomplicated: Secondary | ICD-10-CM | POA: Diagnosis not present

## 2022-12-15 DIAGNOSIS — J301 Allergic rhinitis due to pollen: Secondary | ICD-10-CM | POA: Diagnosis not present

## 2022-12-25 DIAGNOSIS — J3081 Allergic rhinitis due to animal (cat) (dog) hair and dander: Secondary | ICD-10-CM | POA: Diagnosis not present

## 2022-12-25 DIAGNOSIS — J301 Allergic rhinitis due to pollen: Secondary | ICD-10-CM | POA: Diagnosis not present

## 2022-12-25 DIAGNOSIS — J3089 Other allergic rhinitis: Secondary | ICD-10-CM | POA: Diagnosis not present

## 2022-12-30 DIAGNOSIS — J3089 Other allergic rhinitis: Secondary | ICD-10-CM | POA: Diagnosis not present

## 2022-12-30 DIAGNOSIS — J301 Allergic rhinitis due to pollen: Secondary | ICD-10-CM | POA: Diagnosis not present

## 2022-12-30 DIAGNOSIS — J3081 Allergic rhinitis due to animal (cat) (dog) hair and dander: Secondary | ICD-10-CM | POA: Diagnosis not present

## 2023-01-07 DIAGNOSIS — J3089 Other allergic rhinitis: Secondary | ICD-10-CM | POA: Diagnosis not present

## 2023-01-07 DIAGNOSIS — J301 Allergic rhinitis due to pollen: Secondary | ICD-10-CM | POA: Diagnosis not present

## 2023-01-07 DIAGNOSIS — J3081 Allergic rhinitis due to animal (cat) (dog) hair and dander: Secondary | ICD-10-CM | POA: Diagnosis not present

## 2023-01-14 DIAGNOSIS — J3081 Allergic rhinitis due to animal (cat) (dog) hair and dander: Secondary | ICD-10-CM | POA: Diagnosis not present

## 2023-01-14 DIAGNOSIS — J301 Allergic rhinitis due to pollen: Secondary | ICD-10-CM | POA: Diagnosis not present

## 2023-01-14 DIAGNOSIS — J3089 Other allergic rhinitis: Secondary | ICD-10-CM | POA: Diagnosis not present

## 2023-01-22 DIAGNOSIS — J301 Allergic rhinitis due to pollen: Secondary | ICD-10-CM | POA: Diagnosis not present

## 2023-01-22 DIAGNOSIS — J3089 Other allergic rhinitis: Secondary | ICD-10-CM | POA: Diagnosis not present

## 2023-01-22 DIAGNOSIS — J3081 Allergic rhinitis due to animal (cat) (dog) hair and dander: Secondary | ICD-10-CM | POA: Diagnosis not present

## 2023-01-29 DIAGNOSIS — J3081 Allergic rhinitis due to animal (cat) (dog) hair and dander: Secondary | ICD-10-CM | POA: Diagnosis not present

## 2023-01-29 DIAGNOSIS — J3089 Other allergic rhinitis: Secondary | ICD-10-CM | POA: Diagnosis not present

## 2023-01-29 DIAGNOSIS — J301 Allergic rhinitis due to pollen: Secondary | ICD-10-CM | POA: Diagnosis not present

## 2023-02-12 DIAGNOSIS — J301 Allergic rhinitis due to pollen: Secondary | ICD-10-CM | POA: Diagnosis not present

## 2023-02-12 DIAGNOSIS — J3089 Other allergic rhinitis: Secondary | ICD-10-CM | POA: Diagnosis not present

## 2023-02-12 DIAGNOSIS — J3081 Allergic rhinitis due to animal (cat) (dog) hair and dander: Secondary | ICD-10-CM | POA: Diagnosis not present

## 2023-02-25 DIAGNOSIS — J301 Allergic rhinitis due to pollen: Secondary | ICD-10-CM | POA: Diagnosis not present

## 2023-02-25 DIAGNOSIS — J3089 Other allergic rhinitis: Secondary | ICD-10-CM | POA: Diagnosis not present

## 2023-02-25 DIAGNOSIS — J3081 Allergic rhinitis due to animal (cat) (dog) hair and dander: Secondary | ICD-10-CM | POA: Diagnosis not present

## 2023-02-26 DIAGNOSIS — J3089 Other allergic rhinitis: Secondary | ICD-10-CM | POA: Diagnosis not present

## 2023-02-26 DIAGNOSIS — J3081 Allergic rhinitis due to animal (cat) (dog) hair and dander: Secondary | ICD-10-CM | POA: Diagnosis not present

## 2023-02-26 DIAGNOSIS — N951 Menopausal and female climacteric states: Secondary | ICD-10-CM | POA: Diagnosis not present

## 2023-02-26 DIAGNOSIS — Z01419 Encounter for gynecological examination (general) (routine) without abnormal findings: Secondary | ICD-10-CM | POA: Diagnosis not present

## 2023-02-26 DIAGNOSIS — J301 Allergic rhinitis due to pollen: Secondary | ICD-10-CM | POA: Diagnosis not present

## 2023-02-26 DIAGNOSIS — Z6831 Body mass index (BMI) 31.0-31.9, adult: Secondary | ICD-10-CM | POA: Diagnosis not present

## 2023-03-04 DIAGNOSIS — J3089 Other allergic rhinitis: Secondary | ICD-10-CM | POA: Diagnosis not present

## 2023-03-04 DIAGNOSIS — J3081 Allergic rhinitis due to animal (cat) (dog) hair and dander: Secondary | ICD-10-CM | POA: Diagnosis not present

## 2023-03-04 DIAGNOSIS — J301 Allergic rhinitis due to pollen: Secondary | ICD-10-CM | POA: Diagnosis not present

## 2023-03-12 DIAGNOSIS — J3081 Allergic rhinitis due to animal (cat) (dog) hair and dander: Secondary | ICD-10-CM | POA: Diagnosis not present

## 2023-03-12 DIAGNOSIS — J3089 Other allergic rhinitis: Secondary | ICD-10-CM | POA: Diagnosis not present

## 2023-03-12 DIAGNOSIS — J301 Allergic rhinitis due to pollen: Secondary | ICD-10-CM | POA: Diagnosis not present

## 2023-03-19 DIAGNOSIS — J3089 Other allergic rhinitis: Secondary | ICD-10-CM | POA: Diagnosis not present

## 2023-03-19 DIAGNOSIS — J3081 Allergic rhinitis due to animal (cat) (dog) hair and dander: Secondary | ICD-10-CM | POA: Diagnosis not present

## 2023-03-19 DIAGNOSIS — J301 Allergic rhinitis due to pollen: Secondary | ICD-10-CM | POA: Diagnosis not present

## 2023-03-25 DIAGNOSIS — J3081 Allergic rhinitis due to animal (cat) (dog) hair and dander: Secondary | ICD-10-CM | POA: Diagnosis not present

## 2023-03-25 DIAGNOSIS — J301 Allergic rhinitis due to pollen: Secondary | ICD-10-CM | POA: Diagnosis not present

## 2023-03-25 DIAGNOSIS — J3089 Other allergic rhinitis: Secondary | ICD-10-CM | POA: Diagnosis not present

## 2023-04-03 DIAGNOSIS — J3089 Other allergic rhinitis: Secondary | ICD-10-CM | POA: Diagnosis not present

## 2023-04-03 DIAGNOSIS — J3081 Allergic rhinitis due to animal (cat) (dog) hair and dander: Secondary | ICD-10-CM | POA: Diagnosis not present

## 2023-04-03 DIAGNOSIS — J301 Allergic rhinitis due to pollen: Secondary | ICD-10-CM | POA: Diagnosis not present

## 2023-04-08 DIAGNOSIS — J301 Allergic rhinitis due to pollen: Secondary | ICD-10-CM | POA: Diagnosis not present

## 2023-04-08 DIAGNOSIS — J3089 Other allergic rhinitis: Secondary | ICD-10-CM | POA: Diagnosis not present

## 2023-04-08 DIAGNOSIS — J3081 Allergic rhinitis due to animal (cat) (dog) hair and dander: Secondary | ICD-10-CM | POA: Diagnosis not present

## 2023-04-13 ENCOUNTER — Ambulatory Visit: Payer: Medicare HMO | Admitting: Nurse Practitioner

## 2023-04-13 ENCOUNTER — Encounter: Payer: Self-pay | Admitting: Nurse Practitioner

## 2023-04-13 VITALS — BP 120/70 | HR 72 | Temp 98.5°F | Ht <= 58 in | Wt 150.2 lb

## 2023-04-13 DIAGNOSIS — I10 Essential (primary) hypertension: Secondary | ICD-10-CM | POA: Diagnosis not present

## 2023-04-13 DIAGNOSIS — E782 Mixed hyperlipidemia: Secondary | ICD-10-CM | POA: Diagnosis not present

## 2023-04-13 DIAGNOSIS — I700 Atherosclerosis of aorta: Secondary | ICD-10-CM | POA: Diagnosis not present

## 2023-04-13 DIAGNOSIS — Z23 Encounter for immunization: Secondary | ICD-10-CM

## 2023-04-13 DIAGNOSIS — Z2821 Immunization not carried out because of patient refusal: Secondary | ICD-10-CM

## 2023-04-13 DIAGNOSIS — Z79899 Other long term (current) drug therapy: Secondary | ICD-10-CM | POA: Diagnosis not present

## 2023-04-13 NOTE — Progress Notes (Signed)
Madelaine Bhat, CMA,acting as a Neurosurgeon for Arnette Felts, FNP.,have documented all relevant documentation on the behalf of Arnette Felts, FNP,as directed by  Arnette Felts, FNP while in the presence of Arnette Felts, FNP.  Subjective:  Patient ID: Linda Moreno , female    DOB: Nov 26, 1945 , 77 y.o.   MRN: [id number]  Chief Complaint  Patient presents with   Hypertension    HPI  Patient presents today for a bp and chol follow up, Patient reports compliance with medication. Patient denies any chest pain, SOB, or headaches. Patient has no concerns today.  She continues to get PAPs at her GYN     Past Medical History:  Diagnosis Date   Asthma    Right bundle branch block 06/25/2021     Family History  Problem Relation Age of Onset   Diabetes Father    Stroke Father    Breast cancer Neg Hx      Current Outpatient Medications:    albuterol (PROVENTIL HFA;VENTOLIN HFA) 108 (90 Base) MCG/ACT inhaler, Inhale 2 puffs into the lungs every 4 (four) hours as needed for wheezing or shortness of breath., Disp: , Rfl:    amLODipine (NORVASC) 10 MG tablet, TAKE 1 TABLET EVERY DAY, Disp: 90 tablet, Rfl: 3   atorvastatin (LIPITOR) 10 MG tablet, TAKE 1 TABLET EVERY DAY, Disp: 90 tablet, Rfl: 3   cetirizine (ZYRTEC ALLERGY) 10 MG tablet, Take 1 tablet (10 mg total) by mouth daily., Disp: 90 tablet, Rfl: 1   EPINEPHrine 0.3 mg/0.3 mL IJ SOAJ injection, Inject into the muscle., Disp: , Rfl:    fluticasone furoate-vilanterol (BREO ELLIPTA) 100-25 MCG/INH AEPB, Inhale 1 puff into the lungs daily., Disp: , Rfl:    ibandronate (BONIVA) 150 MG tablet, TAKE 1 TABLET ORALLY ONCE MONTHLY WITH 8-10 OZ WATER. SIT UPRIGHT AND NO FOOD/DRINK FOR 1 HOUR., Disp: 30 tablet, Rfl: 0   multivitamin-iron-minerals-folic acid (CENTRUM) chewable tablet, Chew 1 tablet by mouth daily., Disp: , Rfl:    Allergies  Allergen Reactions   Shellfish Allergy      Review of Systems  Constitutional: Negative.   Respiratory:  Negative.  Negative for shortness of breath.   Cardiovascular: Negative.  Negative for chest pain, palpitations and leg swelling.  Gastrointestinal: Negative.   Neurological: Negative.  Negative for headaches.  Psychiatric/Behavioral: Negative.       Today's Vitals   04/13/23 1112  BP: 120/70  Pulse: 72  Temp: 98.5 F (36.9 C)  TempSrc: Oral  Weight: 150 lb 3.2 oz (68.1 kg)  Height: 4\' 9"  (1.448 m)  PainSc: 0-No pain   Body mass index is 32.5 kg/m.  Wt Readings from Last 3 Encounters:  04/13/23 150 lb 3.2 oz (68.1 kg)  10/21/22 146 lb 3.2 oz (66.3 kg)  06/25/22 147 lb (66.7 kg)     Objective:  Physical Exam Vitals reviewed.  Constitutional:      General: She is not in acute distress.    Appearance: Normal appearance. She is obese.  Cardiovascular:     Rate and Rhythm: Normal rate and regular rhythm.     Pulses: Normal pulses.     Heart sounds: Normal heart sounds. No murmur heard. Pulmonary:     Effort: Pulmonary effort is normal. No respiratory distress.     Breath sounds: Normal breath sounds. No wheezing.  Skin:    General: Skin is warm and dry.     Capillary Refill: Capillary refill takes less than 2 seconds.  Neurological:  General: No focal deficit present.     Mental Status: She is alert and oriented to person, place, and time.     Cranial Nerves: No cranial nerve deficit.     Motor: No weakness.  Psychiatric:        Mood and Affect: Mood normal.        Behavior: Behavior normal.        Thought Content: Thought content normal.        Judgment: Judgment normal.         Assessment And Plan:  Essential hypertension Assessment & Plan: Blood pressure is well controlled, continue current medications  Orders: -     Basic metabolic panel  Mixed hyperlipidemia Assessment & Plan: Cholesterol levels are stable, continue current medications  Orders: -     Lipid panel  Atherosclerosis of aorta (HCC) Assessment & Plan: Continue statin, tolerating  well   Need for influenza vaccination Assessment & Plan: Influenza vaccine administered Encouraged to take Tylenol as needed for fever or muscle aches.   Orders: -     Flu Vaccine Trivalent High Dose (Fluad)  COVID-19 vaccination declined Assessment & Plan: Declines covid 19 vaccine. Discussed risk of covid 88 and if she changes her mind about the vaccine to call the office. Education has been provided regarding the importance of this vaccine but patient still declined. Advised may receive this vaccine at local pharmacy or Health Dept.or vaccine clinic. Aware to provide a copy of the vaccination record if obtained from local pharmacy or Health Dept.  Encouraged to take multivitamin, vitamin d, vitamin c and zinc to increase immune system. Aware can call office if would like to have vaccine here at office. Verbalized acceptance and understanding.    Other long term (current) drug therapy -     CBC with Differential/Platelet    Return for 6 month bp check.  Patient was given opportunity to ask questions. Patient verbalized understanding of the plan and was able to repeat key elements of the plan. All questions were answered to their satisfaction.    Jeanell Sparrow, FNP, have reviewed all documentation for this visit. The documentation on 04/13/23 for the exam, diagnosis, procedures, and orders are all accurate and complete.   IF YOU HAVE BEEN REFERRED TO A SPECIALIST, IT MAY TAKE 1-2 WEEKS TO SCHEDULE/PROCESS THE REFERRAL. IF YOU HAVE NOT HEARD FROM US/SPECIALIST IN TWO WEEKS, PLEASE GIVE [place] A CALL AT [phone].

## 2023-04-14 LAB — CBC WITH DIFFERENTIAL/PLATELET
Basophils Absolute: 0 10*3/uL (ref 0.0–0.2)
Basos: 1 %
EOS (ABSOLUTE): 0.2 10*3/uL (ref 0.0–0.4)
Eos: 5 %
Hematocrit: 41.1 % (ref 34.0–46.6)
Hemoglobin: 13 g/dL (ref 11.1–15.9)
Immature Grans (Abs): 0 10*3/uL (ref 0.0–0.1)
Immature Granulocytes: 0 %
Lymphocytes Absolute: 1.3 10*3/uL (ref 0.7–3.1)
Lymphs: 26 %
MCH: 28.8 pg (ref 26.6–33.0)
MCHC: 31.6 g/dL (ref 31.5–35.7)
MCV: 91 fL (ref 79–97)
Monocytes Absolute: 0.5 10*3/uL (ref 0.1–0.9)
Monocytes: 10 %
Neutrophils Absolute: 2.9 10*3/uL (ref 1.4–7.0)
Neutrophils: 58 %
Platelets: 248 10*3/uL (ref 150–450)
RBC: 4.51 x10E6/uL (ref 3.77–5.28)
RDW: 12.1 % (ref 11.7–15.4)
WBC: 4.9 10*3/uL (ref 3.4–10.8)

## 2023-04-14 LAB — LIPID PANEL
Chol/HDL Ratio: 2.1 ratio (ref 0.0–4.4)
Cholesterol, Total: 171 mg/dL (ref 100–199)
HDL: 83 mg/dL (ref >39)
LDL Chol Calc (NIH): 75 mg/dL (ref 0–99)
Triglycerides: 70 mg/dL (ref 0–149)
VLDL Cholesterol Cal: 13 mg/dL (ref 5–40)

## 2023-04-14 LAB — BASIC METABOLIC PANEL
BUN/Creatinine Ratio: 22 (ref 12–28)
BUN: 15 mg/dL (ref 8–27)
CO2: 23 mmol/L (ref 20–29)
Calcium: 9.3 mg/dL (ref 8.7–10.3)
Chloride: 103 mmol/L (ref 96–106)
Creatinine, Ser: 0.68 mg/dL (ref 0.57–1.00)
Glucose: 79 mg/dL (ref 70–99)
Potassium: 4.1 mmol/L (ref 3.5–5.2)
Sodium: 140 mmol/L (ref 134–144)
eGFR: 89 mL/min/{1.73_m2} (ref >59)

## 2023-04-19 NOTE — Assessment & Plan Note (Signed)
Influenza vaccine administered Encouraged to take Tylenol as needed for fever or muscle aches.

## 2023-04-19 NOTE — Assessment & Plan Note (Signed)
Continue statin, tolerating well 

## 2023-04-19 NOTE — Assessment & Plan Note (Signed)
Blood pressure is well controlled, continue current medications.

## 2023-04-19 NOTE — Assessment & Plan Note (Signed)

## 2023-04-19 NOTE — Assessment & Plan Note (Signed)
Cholesterol levels are stable, continue current medications.

## 2023-04-22 DIAGNOSIS — J3089 Other allergic rhinitis: Secondary | ICD-10-CM | POA: Diagnosis not present

## 2023-04-22 DIAGNOSIS — J3081 Allergic rhinitis due to animal (cat) (dog) hair and dander: Secondary | ICD-10-CM | POA: Diagnosis not present

## 2023-04-22 DIAGNOSIS — J301 Allergic rhinitis due to pollen: Secondary | ICD-10-CM | POA: Diagnosis not present

## 2023-04-30 ENCOUNTER — Encounter: Payer: Self-pay | Admitting: Nurse Practitioner

## 2023-04-30 ENCOUNTER — Ambulatory Visit: Payer: Medicare HMO | Admitting: Nurse Practitioner

## 2023-04-30 VITALS — BP 130/74 | HR 79 | Temp 98.0°F | Ht <= 58 in | Wt 149.6 lb

## 2023-04-30 DIAGNOSIS — I700 Atherosclerosis of aorta: Secondary | ICD-10-CM | POA: Diagnosis not present

## 2023-04-30 DIAGNOSIS — I119 Hypertensive heart disease without heart failure: Secondary | ICD-10-CM

## 2023-04-30 DIAGNOSIS — H6123 Impacted cerumen, bilateral: Secondary | ICD-10-CM

## 2023-04-30 DIAGNOSIS — Z2821 Immunization not carried out because of patient refusal: Secondary | ICD-10-CM

## 2023-04-30 DIAGNOSIS — E782 Mixed hyperlipidemia: Secondary | ICD-10-CM | POA: Diagnosis not present

## 2023-04-30 DIAGNOSIS — Z23 Encounter for immunization: Secondary | ICD-10-CM | POA: Diagnosis not present

## 2023-04-30 DIAGNOSIS — M810 Age-related osteoporosis without current pathological fracture: Secondary | ICD-10-CM

## 2023-04-30 DIAGNOSIS — Z0000 Encounter for general adult medical examination without abnormal findings: Secondary | ICD-10-CM | POA: Diagnosis not present

## 2023-04-30 DIAGNOSIS — I10 Essential (primary) hypertension: Secondary | ICD-10-CM

## 2023-04-30 DIAGNOSIS — J4520 Mild intermittent asthma, uncomplicated: Secondary | ICD-10-CM | POA: Diagnosis not present

## 2023-04-30 DIAGNOSIS — E559 Vitamin D deficiency, unspecified: Secondary | ICD-10-CM

## 2023-04-30 HISTORY — DX: Impacted cerumen, bilateral: H61.23

## 2023-04-30 LAB — POCT URINALYSIS DIP (CLINITEK)
Bilirubin, UA: NEGATIVE
Blood, UA: NEGATIVE
Glucose, UA: NEGATIVE mg/dL
Ketones, POC UA: NEGATIVE mg/dL
Leukocytes, UA: NEGATIVE
Nitrite, UA: NEGATIVE
POC PROTEIN,UA: NEGATIVE
Spec Grav, UA: >=1.03 — AB (ref 1.010–1.025)
Urobilinogen, UA: 0.2 U/dL
pH, UA: 6 (ref 5.0–8.0)

## 2023-04-30 NOTE — Assessment & Plan Note (Signed)
Behavior modifications discussed and diet history reviewed.   Pt will continue to exercise regularly and modify diet with low GI, plant based foods and decrease intake of processed foods.  Recommend intake of daily multivitamin, Vitamin D, and calcium.  Recommend mammogram for preventive screenings, as well as recommend immunizations that include influenza, TDAP, and Shingles

## 2023-04-30 NOTE — Assessment & Plan Note (Signed)
She has some mild wheezing however reports this as her normal, continue f/u with Pulmonology

## 2023-04-30 NOTE — Progress Notes (Signed)
Madelaine Bhat, CMA,acting as a Neurosurgeon for Arnette Felts, FNP.,have documented all relevant documentation on the behalf of Arnette Felts, FNP,as directed by  Arnette Felts, FNP while in the presence of Arnette Felts, FNP.  Subjective:    Patient ID: Linda Moreno , female    DOB: [birth year] , 77 y.o.   MRN: [id number]  Chief Complaint  Patient presents with   Annual Exam    HPI  Patient presents today for HM, Patient reports compliance with medication. Patient denies any chest pain, SOB, or headaches. Patient has no concerns today. Patient would like her pneumonia shot today.      Past Medical History:  Diagnosis Date   Asthma    Asthma exacerbation 06/22/2021   Mild persistent asthma, uncomplicated 06/22/2021   Right bundle branch block 06/25/2021     Family History  Problem Relation Age of Onset   Diabetes Father    Stroke Father    Breast cancer Neg Hx      Current Outpatient Medications:    albuterol (PROVENTIL HFA;VENTOLIN HFA) 108 (90 Base) MCG/ACT inhaler, Inhale 2 puffs into the lungs every 4 (four) hours as needed for wheezing or shortness of breath., Disp: , Rfl:    amLODipine (NORVASC) 10 MG tablet, TAKE 1 TABLET EVERY DAY, Disp: 90 tablet, Rfl: 3   atorvastatin (LIPITOR) 10 MG tablet, TAKE 1 TABLET EVERY DAY, Disp: 90 tablet, Rfl: 3   cetirizine (ZYRTEC ALLERGY) 10 MG tablet, Take 1 tablet (10 mg total) by mouth daily., Disp: 90 tablet, Rfl: 1   EPINEPHrine 0.3 mg/0.3 mL IJ SOAJ injection, Inject into the muscle., Disp: , Rfl:    fluticasone furoate-vilanterol (BREO ELLIPTA) 100-25 MCG/INH AEPB, Inhale 1 puff into the lungs daily., Disp: , Rfl:    ibandronate (BONIVA) 150 MG tablet, TAKE 1 TABLET ORALLY ONCE MONTHLY WITH 8-10 OZ WATER. SIT UPRIGHT AND NO FOOD/DRINK FOR 1 HOUR., Disp: 30 tablet, Rfl: 0   multivitamin-iron-minerals-folic acid (CENTRUM) chewable tablet, Chew 1 tablet by mouth daily., Disp: , Rfl:    Allergies  Allergen Reactions   Shellfish Allergy        The patient states she uses post menopausal status for birth control. No LMP recorded. Patient is postmenopausal.  Negative for: breast discharge, breast lump(s), breast pain and breast self exam. Associated symptoms include abnormal vaginal bleeding. Pertinent negatives include abnormal bleeding (hematology), anxiety, decreased libido, depression, difficulty falling sleep, dyspareunia, history of infertility, nocturia, sexual dysfunction, sleep disturbances, urinary incontinence, urinary urgency, vaginal discharge and vaginal itching. Diet regular.The patient states her exercise level is minimal will do some walking and "I do crunches to tighten my stomach".  She keeps her grandchild and keeps her busy.   The patient's tobacco use is:  Social History   Tobacco Use  Smoking Status Never  Smokeless Tobacco Never  She has been exposed to passive smoke. The patient's alcohol use is:  Social History   Substance and Sexual Activity  Alcohol Use No    Review of Systems  Constitutional: Negative.   HENT: Negative.    Eyes: Negative.   Respiratory: Negative.    Cardiovascular: Negative.   Gastrointestinal: Negative.   Endocrine: Negative.   Genitourinary: Negative.   Musculoskeletal: Negative.   Skin: Negative.   Allergic/Immunologic: Negative.   Neurological: Negative.   Hematological: Negative.   Psychiatric/Behavioral: Negative.       Today's Vitals   04/30/23 0914  BP: 130/74  Pulse: 79  Temp: 98 F (36.7 C)  TempSrc: Oral  Weight: 149 lb 9.6 oz (67.9 kg)  Height: 4\' 9"  (1.448 m)  PainSc: 0-No pain   Body mass index is 32.37 kg/m.  Wt Readings from Last 3 Encounters:  04/30/23 149 lb 9.6 oz (67.9 kg)  04/13/23 150 lb 3.2 oz (68.1 kg)  10/21/22 146 lb 3.2 oz (66.3 kg)     Objective:  Physical Exam Vitals reviewed.  Constitutional:      General: She is not in acute distress.    Appearance: Normal appearance. She is well-developed. She is obese.  HENT:      Head: Normocephalic and atraumatic.     Right Ear: Hearing and external ear normal. There is impacted cerumen.     Left Ear: Hearing and external ear normal. There is impacted cerumen.     Nose: Nose normal.     Mouth/Throat:     Mouth: Mucous membranes are moist.     Dentition: No gum lesions.     Pharynx: Oropharynx is clear.     Comments: She has mouth odor Eyes:     General: Lids are normal.     Extraocular Movements: Extraocular movements intact.     Conjunctiva/sclera: Conjunctivae normal.     Pupils: Pupils are equal, round, and reactive to light.     Funduscopic exam:    Right eye: No papilledema.        Left eye: No papilledema.  Neck:     Thyroid: No thyroid mass.     Vascular: No carotid bruit.  Cardiovascular:     Rate and Rhythm: Normal rate and regular rhythm.     Pulses: Normal pulses.     Heart sounds: Normal heart sounds. No murmur heard. Pulmonary:     Effort: Pulmonary effort is normal. No respiratory distress.     Breath sounds: Normal breath sounds. No wheezing.  Chest:     Chest wall: No mass.  Breasts:    Tanner Score is 5.     Right: Normal. No swelling, mass or tenderness.     Left: Normal. No swelling, mass or tenderness.  Abdominal:     General: Abdomen is flat. Bowel sounds are normal. There is no distension.     Palpations: Abdomen is soft.     Tenderness: There is no abdominal tenderness.  Genitourinary:    Comments: Deferred - seen by GYN Musculoskeletal:        General: No swelling or tenderness. Normal range of motion.     Cervical back: Full passive range of motion without pain, normal range of motion and neck supple.     Right lower leg: No edema.     Left lower leg: No edema.     Comments: Right hip decreased range of motion  Lymphadenopathy:     Upper Body:     Right upper body: No supraclavicular, axillary or pectoral adenopathy.     Left upper body: No supraclavicular, axillary or pectoral adenopathy.  Skin:    General: Skin is  warm and dry.     Capillary Refill: Capillary refill takes less than 2 seconds.  Neurological:     General: No focal deficit present.     Mental Status: She is alert and oriented to person, place, and time.     Cranial Nerves: No cranial nerve deficit.     Sensory: No sensory deficit.     Motor: No weakness.  Psychiatric:        Mood and Affect: Mood normal.  Behavior: Behavior normal.        Thought Content: Thought content normal.        Judgment: Judgment normal.         Assessment And Plan:     Encounter for annual health examination Assessment & Plan: Behavior modifications discussed and diet history reviewed.   Pt will continue to exercise regularly and modify diet with low GI, plant based foods and decrease intake of processed foods.  Recommend intake of daily multivitamin, Vitamin D, and calcium.  Recommend mammogram for preventive screenings, as well as recommend immunizations that include influenza, TDAP, and Shingles    Essential hypertension Assessment & Plan: Blood pressure is well controlled, continue current medications. Continue focusing on lifestyle modifications. EKG done with Sinus Rhythm -Short PR syndrome -With rate variation, Right bundle branch block.  HR 79   Orders: -     EKG 12-Lead -     POCT URINALYSIS DIP (CLINITEK) -     Microalbumin / creatinine urine ratio  Mixed hyperlipidemia Assessment & Plan: Cholesterol levels are stable, continue current medications   Vitamin D deficiency Assessment & Plan: Will check vitamin D level and supplement as needed.    Also encouraged to spend 15 minutes in the sun daily.     Atherosclerosis of aorta (HCC) Assessment & Plan: Continue statin, tolerating well   COVID-19 vaccination declined Assessment & Plan: Declines covid 19 vaccine. Discussed risk of covid 56 and if she changes her mind about the vaccine to call the office. Education has been provided regarding the importance of this  vaccine but patient still declined. Advised may receive this vaccine at local pharmacy or Health Dept.or vaccine clinic. Aware to provide a copy of the vaccination record if obtained from local pharmacy or Health Dept.  Encouraged to take multivitamin, vitamin d, vitamin c and zinc to increase immune system. Aware can call office if would like to have vaccine here at office. Verbalized acceptance and understanding.    Immunization due Assessment & Plan: Prevnar 20 given in office  Orders: -     Pneumococcal conjugate vaccine 20-valent  Aortic atherosclerosis (HCC)  Mild intermittent asthma without complication Assessment & Plan: She has some mild wheezing however reports this as her normal, continue f/u with Pulmonology   Age-related osteoporosis without current pathological fracture Assessment & Plan: Continue f/u with GYN    Bilateral impacted cerumen Assessment & Plan:  Wax is removed by with lavage with elephant ear with 1/2 water and 1/2 peroxide. Instructions for home care to prevent wax buildup are given.   Orders: -     Ear Lavage  Hypertensive heart disease without CHF Assessment & Plan: Blood pressure is well controlled, continue current medications. Continue focusing on lifestyle modifications. EKG done with Sinus Rhythm -Short PR syndrome -With rate variation, Right bundle branch block.  HR 79     Return for 1 year physical. Patient was given opportunity to ask questions. Patient verbalized understanding of the plan and was able to repeat key elements of the plan. All questions were answered to their satisfaction.   Arnette Felts, FNP  I, Arnette Felts, FNP, have reviewed all documentation for this visit. The documentation on 04/30/23 for the exam, diagnosis, procedures, and orders are all accurate and complete.

## 2023-04-30 NOTE — Assessment & Plan Note (Signed)

## 2023-04-30 NOTE — Assessment & Plan Note (Signed)
 Wax is removed by with lavage with elephant ear with 1/2 water and 1/2 peroxide. Instructions for home care to prevent wax buildup are given.

## 2023-04-30 NOTE — Assessment & Plan Note (Signed)
Continue f/u with GYN

## 2023-04-30 NOTE — Assessment & Plan Note (Signed)
Prevnar 20 given in office

## 2023-04-30 NOTE — Assessment & Plan Note (Signed)
Continue statin, tolerating well 

## 2023-04-30 NOTE — Assessment & Plan Note (Signed)
Will check vitamin D level and supplement as needed.    Also encouraged to spend 15 minutes in the sun daily.   

## 2023-04-30 NOTE — Assessment & Plan Note (Addendum)
Blood pressure is well controlled, continue current medications. Continue focusing on lifestyle modifications. EKG done with Sinus Rhythm -Short PR syndrome -With rate variation, Right bundle branch block.  HR 79

## 2023-04-30 NOTE — Assessment & Plan Note (Signed)
 Cholesterol levels are stable, continue current medications.

## 2023-04-30 NOTE — Patient Instructions (Signed)
Health Maintenance  Topic Date Due   DEXA scan (bone density measurement)  11/13/2021   Medicare Annual Wellness Visit  06/26/2023   COVID-19 Vaccine (4 - 2023-24 season) 05/16/2023*   Pneumonia Vaccine (2 of 2 - PCV) 04/12/2024*   Mammogram  07/29/2023   Colon Cancer Screening  06/25/2025   DTaP/Tdap/Td vaccine (3 - Td or Tdap) 02/16/2028   Flu Shot  Completed   Hepatitis C Screening  Completed   Zoster (Shingles) Vaccine  Completed   HPV Vaccine  Aged Out  *Topic was postponed. The date shown is not the original due date.

## 2023-05-09 ENCOUNTER — Emergency Department (HOSPITAL_COMMUNITY): Payer: Medicare HMO

## 2023-05-09 ENCOUNTER — Encounter (HOSPITAL_COMMUNITY): Payer: Self-pay

## 2023-05-09 ENCOUNTER — Emergency Department (HOSPITAL_COMMUNITY)
Admission: EM | Admit: 2023-05-09 | Discharge: 2023-05-09 | Disposition: A | Discharge status: Home / Self Care | Payer: Medicare HMO | Attending: Emergency Medicine | Admitting: Emergency Medicine

## 2023-05-09 ENCOUNTER — Other Ambulatory Visit: Payer: Self-pay

## 2023-05-09 DIAGNOSIS — R918 Other nonspecific abnormal finding of lung field: Secondary | ICD-10-CM | POA: Diagnosis not present

## 2023-05-09 DIAGNOSIS — J181 Lobar pneumonia, unspecified organism: Secondary | ICD-10-CM | POA: Insufficient documentation

## 2023-05-09 DIAGNOSIS — R0602 Shortness of breath: Secondary | ICD-10-CM | POA: Diagnosis not present

## 2023-05-09 DIAGNOSIS — I700 Atherosclerosis of aorta: Secondary | ICD-10-CM | POA: Diagnosis not present

## 2023-05-09 DIAGNOSIS — Z1152 Encounter for screening for COVID-19: Secondary | ICD-10-CM | POA: Diagnosis not present

## 2023-05-09 DIAGNOSIS — J45901 Unspecified asthma with (acute) exacerbation: Secondary | ICD-10-CM | POA: Diagnosis not present

## 2023-05-09 DIAGNOSIS — J189 Pneumonia, unspecified organism: Secondary | ICD-10-CM | POA: Diagnosis not present

## 2023-05-09 LAB — BASIC METABOLIC PANEL
Anion gap: 10 (ref 5–15)
BUN: 15 mg/dL (ref 8–23)
CO2: 29 mmol/L (ref 22–32)
Calcium: 10 mg/dL (ref 8.9–10.3)
Chloride: 100 mmol/L (ref 98–111)
Creatinine, Ser: 0.73 mg/dL (ref 0.44–1.00)
GFR, Estimated: >60 mL/min (ref >60)
Glucose, Bld: 140 mg/dL — ABNORMAL HIGH (ref 70–99)
Potassium: 3.7 mmol/L (ref 3.5–5.1)
Sodium: 139 mmol/L (ref 135–145)

## 2023-05-09 LAB — RESP PANEL BY RT-PCR (RSV, FLU A&B, COVID)  RVPGX2
Influenza A by PCR: NEGATIVE
Influenza B by PCR: NEGATIVE
Resp Syncytial Virus by PCR: NEGATIVE
SARS Coronavirus 2 by RT PCR: NEGATIVE

## 2023-05-09 LAB — CBC WITH DIFFERENTIAL/PLATELET
Abs Immature Granulocytes: 0.01 10*3/uL (ref 0.00–0.07)
Basophils Absolute: 0.1 10*3/uL (ref 0.0–0.1)
Basophils Relative: 1 %
Eosinophils Absolute: 0.6 10*3/uL — ABNORMAL HIGH (ref 0.0–0.5)
Eosinophils Relative: 9 %
HCT: 43.1 % (ref 36.0–46.0)
Hemoglobin: 14.4 g/dL (ref 12.0–15.0)
Immature Granulocytes: 0 %
Lymphocytes Relative: 25 %
Lymphs Abs: 1.8 10*3/uL (ref 0.7–4.0)
MCH: 29.7 pg (ref 26.0–34.0)
MCHC: 33.4 g/dL (ref 30.0–36.0)
MCV: 88.9 fL (ref 80.0–100.0)
Monocytes Absolute: 0.9 10*3/uL (ref 0.1–1.0)
Monocytes Relative: 13 %
Neutro Abs: 3.7 10*3/uL (ref 1.7–7.7)
Neutrophils Relative %: 52 %
Platelets: 284 10*3/uL (ref 150–400)
RBC: 4.85 MIL/uL (ref 3.87–5.11)
RDW: 12.7 % (ref 11.5–15.5)
WBC: 7 10*3/uL (ref 4.0–10.5)
nRBC: 0 % (ref 0.0–0.2)

## 2023-05-09 MED ORDER — AMOXICILLIN-POT CLAVULANATE 875-125 MG PO TABS
1.0000 | ORAL_TABLET | Freq: Once | ORAL | Status: AC
  Administered 2023-05-09: 1 via ORAL
  Filled 2023-05-09: qty 1

## 2023-05-09 MED ORDER — AZITHROMYCIN 250 MG PO TABS
500.0000 mg | ORAL_TABLET | Freq: Once | ORAL | Status: AC
  Administered 2023-05-09: 500 mg via ORAL
  Filled 2023-05-09: qty 2

## 2023-05-09 MED ORDER — ALBUTEROL SULFATE (2.5 MG/3ML) 0.083% IN NEBU
10.0000 mg | INHALATION_SOLUTION | Freq: Once | RESPIRATORY_TRACT | Status: AC
  Administered 2023-05-09: 10 mg via RESPIRATORY_TRACT
  Filled 2023-05-09: qty 12

## 2023-05-09 MED ORDER — AZITHROMYCIN 250 MG PO TABS: 250.0000 mg | ORAL_TABLET | Freq: Every day | ORAL | 0 refills | Status: DC

## 2023-05-09 MED ORDER — PREDNISONE 20 MG PO TABS: 60.0000 mg | ORAL_TABLET | Freq: Once | ORAL | Status: DC

## 2023-05-09 MED ORDER — MAGNESIUM SULFATE 2 GM/50ML IV SOLN
2.0000 g | Freq: Once | INTRAVENOUS | Status: AC
  Administered 2023-05-09: 2 g via INTRAVENOUS
  Filled 2023-05-09: qty 50

## 2023-05-09 MED ORDER — ALBUTEROL SULFATE (2.5 MG/3ML) 0.083% IN NEBU
5.0000 mg | INHALATION_SOLUTION | Freq: Once | RESPIRATORY_TRACT | Status: AC
  Administered 2023-05-09: 5 mg via RESPIRATORY_TRACT
  Filled 2023-05-09: qty 6

## 2023-05-09 MED ORDER — IPRATROPIUM BROMIDE 0.02 % IN SOLN
0.5000 mg | Freq: Once | RESPIRATORY_TRACT | Status: AC
  Administered 2023-05-09: 0.5 mg via RESPIRATORY_TRACT
  Filled 2023-05-09: qty 2.5

## 2023-05-09 MED ORDER — AMOXICILLIN-POT CLAVULANATE 875-125 MG PO TABS: 1.0000 | ORAL_TABLET | Freq: Two times a day (BID) | ORAL | 0 refills | Status: DC

## 2023-05-09 MED ORDER — PREDNISONE 20 MG PO TABS: 40.0000 mg | ORAL_TABLET | Freq: Every day | ORAL | 0 refills | Status: DC

## 2023-05-09 MED ORDER — METHYLPREDNISOLONE SODIUM SUCC 125 MG IJ SOLR
125.0000 mg | Freq: Once | INTRAMUSCULAR | Status: AC
  Administered 2023-05-09: 125 mg via INTRAVENOUS
  Filled 2023-05-09: qty 2

## 2023-05-09 NOTE — ED Triage Notes (Signed)
SOB and coughing since Wednesday.  Loud audible wheezes heard even standing at the door to the room.  Patient has used inhaler and nebulizer.

## 2023-05-09 NOTE — Discharge Instructions (Addendum)
Please read and follow all provided instructions.  Your diagnoses today include:  1. Exacerbation of asthma, unspecified asthma severity, unspecified whether persistent   2. Community acquired pneumonia of right lower lobe of lung     Tests performed today include: Chest x-ray had questionable pneumonia but no other problems Viral panel negative for flu and COVID Blood cell counts electrolytes looked reasonable, normal white blood cell count Vital signs. See below for your results today.   Medications prescribed:  Augmentin - antibiotic  You have been prescribed an antibiotic medicine: take the entire course of medicine even if you are feeling better. Stopping early can cause the antibiotic not to work.  Azithromycin - antibiotic for respiratory infection  You have been prescribed an antibiotic medicine: take the entire course of medicine even if you are feeling better. Stopping early can cause the antibiotic not to work.  Prednisone - steroid medicine   It is best to take this medication in the morning to prevent sleeping problems. If you are diabetic, monitor your blood sugar closely and stop taking Prednisone if blood sugar is over 300. Take with food to prevent stomach upset.   Take any prescribed medications only as directed.  Home care instructions:  Follow any educational materials contained in this packet.  Follow-up instructions: Please follow-up with your primary care provider in the next 3 days for further evaluation of your symptoms and management of your asthma.  Return instructions:  Please return to the Emergency Department if you experience worsening symptoms. Please return with worsening wheezing, shortness of breath, or difficulty breathing. Return with persistent fever above 101F.  Please return if you have any other emergent concerns.  Additional Information:  Your vital signs today were: BP 109/89   Pulse 96   Temp 98.6 F (37 C) (Oral)   Resp (!)  22   Ht 4\' 9"  (1.448 m)   Wt 67.6 kg   SpO2 100%   BMI 32.24 kg/m  If your blood pressure (BP) was elevated above 135/85 this visit, please have this repeated by your doctor within one month. --------------

## 2023-05-09 NOTE — ED Provider Triage Note (Signed)
Emergency Medicine Provider Triage Evaluation Note  Tulani Kouba Venhuizen , a 77 y.o. female  was evaluated in triage.  Pt complains of increased shortness of breath and wheezing over the past 3 days.  She has a history of asthma.  Unknown triggers.  Reports limited improvement with albuterol at home.  States that she was hospitalized for similar overnight in 2022.  Review of Systems  Positive: Wheezing Negative: Fever  Physical Exam  BP (!) 155/83 (BP Location: Left Arm)   Pulse (!) 110   Temp 98.7 F (37.1 C)   Resp (!) 24   Ht 4\' 9"  (1.448 m)   Wt 67.6 kg   SpO2 93%   BMI 32.24 kg/m  Gen:   Awake, no distress   Resp:  Slightly increased work of breathing but not in distress, expiratory wheezing throughout all fields, mild tachypnea MSK:   Moves extremities without difficulty  Other:  Mild tachycardia  Medical Decision Making  Medically screening exam initiated at 2:14 PM.  Appropriate orders placed.  Tonae J Shean was informed that the remainder of the evaluation will be completed by another provider, this initial triage assessment does not replace that evaluation, and the importance of remaining in the ED until their evaluation is complete.     Renne Crigler, PA-C 05/09/23 1415

## 2023-05-09 NOTE — ED Provider Notes (Signed)
[place] EMERGENCY DEPARTMENT AT [facility] Provider Note   CSN: [id number] Arrival date & time: 05/09/23  1359     History  Chief Complaint  Patient presents with   Asthma    Linda Moreno is a 77 y.o. female.  Patient seen on arrival by myself in triage.  Pt complains of increased shortness of breath and wheezing over the past 3 days.  She has a history of asthma.  Unknown triggers.  Reports limited improvement with albuterol at home.  States that she was hospitalized for similar overnight in 2022.  Also reports history of hyperlipidemia, RBBB on EKG.  No current fevers.  No known sick contacts.  No lower extremity edema.  No vomiting or diarrhea.       Home Medications Prior to Admission medications   Medication Sig Start Date End Date Taking? Authorizing Provider  albuterol (PROVENTIL HFA;VENTOLIN HFA) 108 (90 Base) MCG/ACT inhaler Inhale 2 puffs into the lungs every 4 (four) hours as needed for wheezing or shortness of breath.    [provider]  amLODipine (NORVASC) 10 MG tablet TAKE 1 TABLET EVERY DAY 09/25/22   Arnette Felts, FNP  atorvastatin (LIPITOR) 10 MG tablet TAKE 1 TABLET EVERY DAY 10/17/22   Arnette Felts, FNP  cetirizine (ZYRTEC ALLERGY) 10 MG tablet Take 1 tablet (10 mg total) by mouth daily. 10/08/20   Arnette Felts, FNP  EPINEPHrine 0.3 mg/0.3 mL IJ SOAJ injection Inject into the muscle. 03/11/21   [provider]  fluticasone furoate-vilanterol (BREO ELLIPTA) 100-25 MCG/INH AEPB Inhale 1 puff into the lungs daily.    [provider]  ibandronate (BONIVA) 150 MG tablet TAKE 1 TABLET ORALLY ONCE MONTHLY WITH 8-10 OZ WATER. SIT UPRIGHT AND NO FOOD/DRINK FOR 1 HOUR. 10/21/22   Arnette Felts, FNP  multivitamin-iron-minerals-folic acid (CENTRUM) chewable tablet Chew 1 tablet by mouth daily.    [provider]      Allergies    Shellfish allergy    Review of Systems   Review of Systems  Physical Exam Updated Vital  Signs BP (!) 155/83 (BP Location: Left Arm)   Pulse (!) 110   Temp 98.7 F (37.1 C)   Resp (!) 24   Ht 4\' 9"  (1.448 m)   Wt 67.6 kg   SpO2 93%   BMI 32.24 kg/m  Physical Exam Vitals and nursing note reviewed.  Constitutional:      Appearance: She is well-developed. She is not diaphoretic.  HENT:     Head: Normocephalic and atraumatic.     Right Ear: External ear normal.     Left Ear: External ear normal.     Nose: Nose normal.     Mouth/Throat:     Mouth: Mucous membranes are moist. Mucous membranes are not dry.  Eyes:     Conjunctiva/sclera: Conjunctivae normal.  Neck:     Vascular: Normal carotid pulses. No JVD.     Trachea: Trachea normal. No tracheal deviation.  Cardiovascular:     Rate and Rhythm: Regular rhythm. Tachycardia present.     Pulses: No decreased pulses.          Radial pulses are 2+ on the right side and 2+ on the left side.     Heart sounds: Normal heart sounds, S1 normal and S2 normal. No murmur heard. Pulmonary:     Effort: Pulmonary effort is normal. No respiratory distress.     Breath sounds: Wheezing present.     Comments: Mild tachypnea Chest:  Chest wall: No tenderness.  Abdominal:     General: Bowel sounds are normal.     Palpations: Abdomen is soft.     Tenderness: There is no abdominal tenderness. There is no guarding or rebound.  Musculoskeletal:        General: Normal range of motion.     Cervical back: Normal range of motion and neck supple. No muscular tenderness.  Skin:    General: Skin is warm and dry.     Coloration: Skin is not pale.  Neurological:     Mental Status: She is alert.     ED Results / Procedures / Treatments   Labs (all labs ordered are listed, but only abnormal results are displayed) Labs Reviewed  BASIC METABOLIC PANEL - Abnormal; Notable for the following components:      Result Value   Glucose, Bld 140 (*)    All other components within normal limits  CBC WITH DIFFERENTIAL/PLATELET - Abnormal;  Notable for the following components:   Eosinophils Absolute 0.6 (*)    All other components within normal limits  RESP PANEL BY RT-PCR (RSV, FLU A&B, COVID)  [id number]    EKG None  Radiology DG Chest Port 1 View Result Date: 05/09/2023 CLINICAL DATA:  Shortness of breath EXAM: PORTABLE CHEST 1 VIEW COMPARISON:  06/22/2021 FINDINGS: Stable cardiomediastinal silhouette with aortic atherosclerosis. Patchy right infrahilar opacity. No pleural effusion or pneumothorax IMPRESSION: Patchy right infrahilar opacity, possible pneumonia. Radiographic follow-up to resolution is recommended. Electronically Signed   By: Jasmine Pang M.D.   On: 05/09/2023 17:06    Procedures Procedures    Medications Ordered in ED Medications  amoxicillin-clavulanate (AUGMENTIN) 875-125 MG per tablet 1 tablet (has no administration in time range)  azithromycin (ZITHROMAX) tablet 500 mg (has no administration in time range)  albuterol (PROVENTIL) (2.5 MG/3ML) 0.083% nebulizer solution 10 mg (10 mg Nebulization Given 05/09/23 1440)  ipratropium (ATROVENT) nebulizer solution 0.5 mg (0.5 mg Nebulization Given 05/09/23 1440)  magnesium sulfate IVPB 2 g 50 mL (0 g Intravenous Stopped 05/09/23 1724)  methylPREDNISolone sodium succinate (SOLU-MEDROL) 125 mg/2 mL injection 125 mg (125 mg Intravenous Given 05/09/23 1440)  albuterol (PROVENTIL) (2.5 MG/3ML) 0.083% nebulizer solution 5 mg (5 mg Nebulization Given 05/09/23 1757)  ipratropium (ATROVENT) nebulizer solution 0.5 mg (0.5 mg Nebulization Given 05/09/23 1757)    ED Course/ Medical Decision Making/ A&P    Patient seen and examined.  Patient looks comfortable now back in exam room, on albuterol.  History obtained directly from patient.  Reviewed hospitalization from 05/2021 for asthma.  Labs/EKG: Ordered CBC, BMP, viral panel.  Imaging: Ordered chest x-ray  Medications/Fluids: Ordered: Albuterol, Atrovent, magnesium, Solu-Medrol  Most recent vital signs reviewed  and are as follows: BP (!) 155/83 (BP Location: Left Arm)   Pulse (!) 110   Temp 98.7 F (37.1 C)   Resp (!) 24   Ht 4\' 9"  (1.448 m)   Wt 67.6 kg   SpO2 93%   BMI 32.24 kg/m   Initial impression: Asthma exacerbation, shortness of breath  8:30 PM Reassessment performed. Patient appears comfortable, stable.  She has made progressive improvement on several rechecks.  She has ambulated in department without hypoxia.  She is speaking in full sentences at bedside.  She does remark that she is waiting for her Breo inhaler to arrive at her house.  She has enough albuterol at home.  She has been using Delsym as well.  Labs personally reviewed and interpreted including: CBC unremarkable; BMP glucose  140 otherwise unremarkable; viral panel negative.  Imaging personally visualized and interpreted including: Chest x-ray with subtle right lower lobe infiltrate.  Reviewed pertinent lab work and imaging with patient at bedside. Questions answered.   Most current vital signs reviewed and are as follows: BP 109/89   Pulse 96   Temp 98.6 F (37 C) (Oral)   Resp (!) 22   Ht 4\' 9"  (1.448 m)   Wt 67.6 kg   SpO2 100%   BMI 32.24 kg/m   Plan: Given improvement, no hypoxia, will discharge to home.  Will treat for community-acquired pneumonia given x-ray findings, albeit subtle.  Patient will receive first dose of Augmentin and azithromycin here.  She will be discharged home with prednisone as well.  Prescriptions written for: Azithromycin, Augmentin, prednisone  Other home care instructions discussed: Albuterol treatments every 4 hours over the next 24 hours and then as needed  ED return instructions discussed: Worsening shortness of breath, increased work of breathing, fever, new or worsening symptoms  Follow-up instructions discussed: Patient encouraged to follow-up with their PCP in 3 days.                                  Medical Decision Making Amount and/or Complexity of Data  Reviewed Labs: ordered. Radiology: ordered.  Risk Prescription drug management.   Patient with cough and wheezing.  Monitored for several hours in the emergency department after receiving steroids and magnesium.  Also a couple albuterol treatments as well as Atrovent.  She has progressively improved.  She is ambulating without hypoxia.  Some residual wheezing noted.  Lab work is reassuring.  Negative viral panel.  Question of a subtle pneumonia in the right lower lobe although white blood cell count is normal.  No fevers.  Patient will be covered with Augmentin and azithromycin given this questionable finding.  Otherwise no concern for CHF, ACS, PE.  Patient looks well.  Plan for discharge home with close PCP follow-up.        Final Clinical Impression(s) / ED Diagnoses Final diagnoses:  Exacerbation of asthma, unspecified asthma severity, unspecified whether persistent  Community acquired pneumonia of right lower lobe of lung    Rx / DC Orders ED Discharge Orders          Ordered    amoxicillin-clavulanate (AUGMENTIN) 875-125 MG tablet  Every 12 hours        05/09/23 2030    azithromycin (ZITHROMAX) 250 MG tablet  Daily        05/09/23 2030    predniSONE (DELTASONE) 20 MG tablet  Daily        05/09/23 2030              Renne Crigler, Cordelia Poche 05/09/23 2034    Ernie Avena, MD 05/09/23 2040

## 2023-05-21 DIAGNOSIS — J3081 Allergic rhinitis due to animal (cat) (dog) hair and dander: Secondary | ICD-10-CM | POA: Diagnosis not present

## 2023-05-21 DIAGNOSIS — J301 Allergic rhinitis due to pollen: Secondary | ICD-10-CM | POA: Diagnosis not present

## 2023-05-21 DIAGNOSIS — J3089 Other allergic rhinitis: Secondary | ICD-10-CM | POA: Diagnosis not present

## 2023-05-28 ENCOUNTER — Telehealth: Payer: Self-pay

## 2023-05-28 DIAGNOSIS — J3081 Allergic rhinitis due to animal (cat) (dog) hair and dander: Secondary | ICD-10-CM | POA: Diagnosis not present

## 2023-05-28 DIAGNOSIS — J3089 Other allergic rhinitis: Secondary | ICD-10-CM | POA: Diagnosis not present

## 2023-05-28 DIAGNOSIS — J301 Allergic rhinitis due to pollen: Secondary | ICD-10-CM | POA: Diagnosis not present

## 2023-05-28 NOTE — Progress Notes (Signed)
 Transition Care Management Unsuccessful Follow-up Telephone Call  Date of discharge and from where:  Linda Moreno 12/14  Attempts:  1st Attempt  Reason for unsuccessful TCM follow-up call:  No answer/busy   [name]  [facility], [facility] Guide, Phone: [phone] Website: [email or web address]

## 2023-05-29 ENCOUNTER — Telehealth: Payer: Self-pay

## 2023-05-29 NOTE — Progress Notes (Signed)
 Transition Care Management Unsuccessful Follow-up Telephone Call  Date of discharge and from where:  Linda Moreno 12/13  Attempts:  2nd Attempt  Reason for unsuccessful TCM follow-up call:  No answer/busy   Linda Moreno   [facility], [facility] Guide, Phone: [phone] Website: [email or web address]

## 2023-06-03 DIAGNOSIS — J3089 Other allergic rhinitis: Secondary | ICD-10-CM | POA: Diagnosis not present

## 2023-06-03 DIAGNOSIS — J3081 Allergic rhinitis due to animal (cat) (dog) hair and dander: Secondary | ICD-10-CM | POA: Diagnosis not present

## 2023-06-03 DIAGNOSIS — J301 Allergic rhinitis due to pollen: Secondary | ICD-10-CM | POA: Diagnosis not present

## 2023-06-11 DIAGNOSIS — J301 Allergic rhinitis due to pollen: Secondary | ICD-10-CM | POA: Diagnosis not present

## 2023-06-11 DIAGNOSIS — J3089 Other allergic rhinitis: Secondary | ICD-10-CM | POA: Diagnosis not present

## 2023-06-11 DIAGNOSIS — J3081 Allergic rhinitis due to animal (cat) (dog) hair and dander: Secondary | ICD-10-CM | POA: Diagnosis not present

## 2023-06-18 DIAGNOSIS — J301 Allergic rhinitis due to pollen: Secondary | ICD-10-CM | POA: Diagnosis not present

## 2023-06-18 DIAGNOSIS — J3089 Other allergic rhinitis: Secondary | ICD-10-CM | POA: Diagnosis not present

## 2023-06-18 DIAGNOSIS — J3081 Allergic rhinitis due to animal (cat) (dog) hair and dander: Secondary | ICD-10-CM | POA: Diagnosis not present

## 2023-06-25 DIAGNOSIS — J301 Allergic rhinitis due to pollen: Secondary | ICD-10-CM | POA: Diagnosis not present

## 2023-06-25 DIAGNOSIS — J3081 Allergic rhinitis due to animal (cat) (dog) hair and dander: Secondary | ICD-10-CM | POA: Diagnosis not present

## 2023-06-25 DIAGNOSIS — J3089 Other allergic rhinitis: Secondary | ICD-10-CM | POA: Diagnosis not present

## 2023-06-30 ENCOUNTER — Other Ambulatory Visit: Payer: Self-pay | Admitting: Nurse Practitioner

## 2023-06-30 DIAGNOSIS — Z1231 Encounter for screening mammogram for malignant neoplasm of breast: Secondary | ICD-10-CM

## 2023-07-02 DIAGNOSIS — J301 Allergic rhinitis due to pollen: Secondary | ICD-10-CM | POA: Diagnosis not present

## 2023-07-02 DIAGNOSIS — J3089 Other allergic rhinitis: Secondary | ICD-10-CM | POA: Diagnosis not present

## 2023-07-02 DIAGNOSIS — J3081 Allergic rhinitis due to animal (cat) (dog) hair and dander: Secondary | ICD-10-CM | POA: Diagnosis not present

## 2023-07-10 DIAGNOSIS — J3089 Other allergic rhinitis: Secondary | ICD-10-CM | POA: Diagnosis not present

## 2023-07-10 DIAGNOSIS — J3081 Allergic rhinitis due to animal (cat) (dog) hair and dander: Secondary | ICD-10-CM | POA: Diagnosis not present

## 2023-07-10 DIAGNOSIS — J301 Allergic rhinitis due to pollen: Secondary | ICD-10-CM | POA: Diagnosis not present

## 2023-07-20 ENCOUNTER — Other Ambulatory Visit: Payer: Self-pay | Admitting: Nurse Practitioner

## 2023-07-20 DIAGNOSIS — E782 Mixed hyperlipidemia: Secondary | ICD-10-CM

## 2023-07-20 DIAGNOSIS — I10 Essential (primary) hypertension: Secondary | ICD-10-CM

## 2023-07-20 NOTE — Telephone Encounter (Signed)
 Copied from CRM [id number]. Topic: Clinical - Medication Refill >> Jul 20, 2023 11:10 AM Phill Myron wrote: Most Recent Primary Care Visit:  Provider: Arnette Felts  Department: [facility]  Visit Type: PHYSICAL  Date: 04/30/2023  Medication: amLODipine (NORVASC) 10 MG tablet atorvastatin (LIPITOR) 10 MG tablet   Has the patient contacted their pharmacy? Yes (Agent: If no, request that the patient contact the pharmacy for the refill. If patient does not wish to contact the pharmacy document the reason why and proceed with request.) (Agent: If yes, when and what did the pharmacy advise?)  Is this the correct pharmacy for this prescription? Yes If no, delete pharmacy and type the correct one.  This is the patient's preferred pharmacy:  [facility] [address], [place] - [address] [place] [address] Phone: [phone] Fax: [phone]     Is the patient out of the medication? No  Has the patient been seen for an appointment in the last year OR does the patient have an upcoming appointment? Yes  Can we respond through MyChart? No  Agent: Please be advised that Rx refills may take up to 3 business days. We ask that you follow-up with your pharmacy.

## 2023-07-20 NOTE — Telephone Encounter (Signed)
 Last Fill: Amlodipine: 09/25/22     Atorvastatin: 10/17/22  Last OV: 04/30/23 Next OV: 10/12/23  Routing to provider for review/authorization.

## 2023-07-21 MED ORDER — ATORVASTATIN CALCIUM 10 MG PO TABS: 10.0000 mg | ORAL_TABLET | Freq: Every day | ORAL | 3 refills | Status: AC

## 2023-07-21 MED ORDER — AMLODIPINE BESYLATE 10 MG PO TABS: 10.0000 mg | ORAL_TABLET | Freq: Every day | ORAL | 3 refills | Status: AC

## 2023-07-23 DIAGNOSIS — J3081 Allergic rhinitis due to animal (cat) (dog) hair and dander: Secondary | ICD-10-CM | POA: Diagnosis not present

## 2023-07-23 DIAGNOSIS — J3089 Other allergic rhinitis: Secondary | ICD-10-CM | POA: Diagnosis not present

## 2023-07-23 DIAGNOSIS — J301 Allergic rhinitis due to pollen: Secondary | ICD-10-CM | POA: Diagnosis not present

## 2023-07-31 ENCOUNTER — Ambulatory Visit
Admission: RE | Admit: 2023-07-31 | Discharge: 2023-07-31 | Disposition: A | Discharge status: Home / Self Care | Payer: Self-pay | Source: Ambulatory Visit | Attending: Nurse Practitioner | Admitting: Nurse Practitioner

## 2023-07-31 DIAGNOSIS — Z1231 Encounter for screening mammogram for malignant neoplasm of breast: Secondary | ICD-10-CM | POA: Diagnosis not present

## 2023-08-06 DIAGNOSIS — J3089 Other allergic rhinitis: Secondary | ICD-10-CM | POA: Diagnosis not present

## 2023-08-06 DIAGNOSIS — J3081 Allergic rhinitis due to animal (cat) (dog) hair and dander: Secondary | ICD-10-CM | POA: Diagnosis not present

## 2023-08-06 DIAGNOSIS — J301 Allergic rhinitis due to pollen: Secondary | ICD-10-CM | POA: Diagnosis not present

## 2023-08-14 ENCOUNTER — Ambulatory Visit: Payer: Self-pay

## 2023-08-14 DIAGNOSIS — Z0000 Encounter for general adult medical examination without abnormal findings: Secondary | ICD-10-CM

## 2023-08-14 NOTE — Progress Notes (Signed)
 Subjective:   Zyia Kaneko Laham is a 78 y.o. female who presents for Medicare Annual (Subsequent) preventive examination.  Visit Complete: Virtual I connected with  Janya J Riding on 08/14/23 by a audio enabled telemedicine application and verified that I am speaking with the correct person using two identifiers.  Patient Location: Home  Provider Location: Office/Clinic  I discussed the limitations of evaluation and management by telemedicine. The patient expressed understanding and agreed to proceed.  Vital Signs: Because this visit was a virtual/telehealth visit, some criteria may be missing or patient reported. Any vitals not documented were not able to be obtained and vitals that have been documented are patient reported.  Patient Medicare AWV questionnaire was completed by the patient on 08/14/2023; I have confirmed that all information answered by patient is correct and no changes since this date.        Objective:    There were no vitals filed for this visit. There is no height or weight on file to calculate BMI.     05/09/2023    2:11 PM 06/25/2022   11:35 AM 06/22/2021   10:10 PM 06/19/2021   11:20 AM 06/17/2021    6:32 PM 10/11/2020    2:46 PM 07/07/2019    2:29 PM  Advanced Directives  Does Patient Have a Medical Advance Directive? No No No No No No Yes  Type of Tax inspector;Living will  Copy of Healthcare Power of Attorney in Chart?       No - copy requested  Would patient like information on creating a medical advance directive? No - Patient declined  No - Patient declined  No - Patient declined      Current Medications (verified) Outpatient Encounter Medications as of 08/14/2023  Medication Sig   albuterol (PROVENTIL HFA;VENTOLIN HFA) 108 (90 Base) MCG/ACT inhaler Inhale 2 puffs into the lungs every 4 (four) hours as needed for wheezing or shortness of breath.   amLODipine (NORVASC) 10 MG tablet Take 1 tablet (10 mg total) by mouth  daily.   amoxicillin-clavulanate (AUGMENTIN) 875-125 MG tablet Take 1 tablet by mouth every 12 (twelve) hours.   atorvastatin (LIPITOR) 10 MG tablet Take 1 tablet (10 mg total) by mouth daily.   azithromycin (ZITHROMAX) 250 MG tablet Take 1 tablet (250 mg total) by mouth daily.   cetirizine (ZYRTEC ALLERGY) 10 MG tablet Take 1 tablet (10 mg total) by mouth daily. (Patient taking differently: Take 10 mg by mouth daily as needed for allergies.)   EPINEPHrine 0.3 mg/0.3 mL IJ SOAJ injection Inject into the muscle.   fluticasone furoate-vilanterol (BREO ELLIPTA) 100-25 MCG/INH AEPB Inhale 1 puff into the lungs daily.   ibandronate (BONIVA) 150 MG tablet TAKE 1 TABLET ORALLY ONCE MONTHLY WITH 8-10 OZ WATER. SIT UPRIGHT AND NO FOOD/DRINK FOR 1 HOUR.   multivitamin-iron-minerals-folic acid (CENTRUM) chewable tablet Chew 1 tablet by mouth daily.   predniSONE (DELTASONE) 20 MG tablet Take 2 tablets (40 mg total) by mouth daily.   No facility-administered encounter medications on file as of 08/14/2023.    Allergies (verified) Shellfish allergy   History: Past Medical History:  Diagnosis Date   Asthma    Asthma exacerbation 06/22/2021   Mild persistent asthma, uncomplicated 06/22/2021   Right bundle branch block 06/25/2021   No past surgical history on file. Family History  Problem Relation Age of Onset   Diabetes Father    Stroke Father    Breast cancer  Neg Hx    Social History   Socioeconomic History   Marital status: Married    Spouse name: Not on file   Number of children: Not on file   Years of education: Not on file   Highest education level: Not on file  Occupational History   Occupation: retired  Tobacco Use   Smoking status: Never   Smokeless tobacco: Never  Vaping Use   Vaping status: Never Used  Substance and Sexual Activity   Alcohol use: No   Drug use: No   Sexual activity: Yes  Other Topics Concern   Not on file  Social History Narrative   Not on file    Social Drivers of Health   Financial Resource Strain: Low Risk  (06/25/2022)   Overall Financial Resource Strain (CARDIA)    Difficulty of Paying Living Expenses: Not hard at all  Food Insecurity: No Food Insecurity (06/25/2022)   Hunger Vital Sign    Worried About Running Out of Food in the Last Year: Never true    Ran Out of Food in the Last Year: Never true  Transportation Needs: No Transportation Needs (06/25/2022)   PRAPARE - Administrator, Civil Service (Medical): No    Lack of Transportation (Non-Medical): No  Physical Activity: Inactive (06/25/2022)   Exercise Vital Sign    Days of Exercise per Week: 0 days    Minutes of Exercise per Session: 0 min  Stress: No Stress Concern Present (06/25/2022)   [employer] of Occupational Health - Occupational Stress Questionnaire    Feeling of Stress : Not at all  Social Connections: Not on file    Tobacco Counseling Counseling given: Not Answered   Clinical Intake:                        Activities of Daily Living     No data to display          Patient Care Team: Arnette Felts, FNP as PCP - General (General Practice)  Indicate any recent Medical Services you may have received from other than [facility] providers in the past year (date may be approximate).     Assessment:   This is a routine wellness examination for [place].  Hearing/Vision screen No results found.   Goals Addressed   None    Depression Screen    04/30/2023    9:16 AM 10/21/2022    8:47 AM 06/25/2022   11:36 AM 11/28/2021   11:42 AM 06/19/2021   11:21 AM 10/11/2020    2:46 PM 10/08/2020   11:28 AM  PHQ 2/9 Scores  PHQ - 2 Score 0 0 0 0 0 0 0  PHQ- 9 Score 0 0         Fall Risk    04/30/2023    9:16 AM 10/21/2022    8:47 AM 06/25/2022   11:36 AM 11/28/2021   11:42 AM 06/19/2021   11:20 AM  Fall Risk   Falls in the past year? 0 0 0 0 0  Number falls in past yr: 0 0 0 0   Injury with Fall? 0 0 0 0   Risk for fall due  to : No Fall Risks No Fall Risks Medication side effect  Medication side effect  Follow up Falls evaluation completed Falls evaluation completed Falls prevention discussed;Education provided;Falls evaluation completed Falls evaluation completed Falls evaluation completed;Education provided;Falls prevention discussed    MEDICARE RISK AT HOME:  TIMED UP AND GO:  Was the test performed?  No    Cognitive Function:        06/25/2022   11:37 AM 06/19/2021   11:22 AM 10/11/2020    2:48 PM 07/07/2019    2:31 PM 04/06/2019   11:37 AM  6CIT Screen  What Year? 0 points 0 points 0 points 0 points 0 points  What month? 0 points 0 points 0 points 0 points 0 points  What time? 0 points 0 points 0 points 0 points 0 points  Count back from 20 0 points 0 points 0 points 0 points 0 points  Months in reverse 0 points 0 points 2 points 0 points 0 points  Repeat phrase 2 points 0 points 0 points 2 points 8 points  Total Score 2 points 0 points 2 points 2 points 8 points    Immunizations Immunization History  Administered Date(s) Administered   Fluad Trivalent(High Dose 65+) 04/13/2023   Influenza, High Dose Seasonal PF 05/18/2015, 04/21/2016, 06/05/2017, 02/15/2018, 06/14/2018, 01/07/2019, 03/12/2020, 03/20/2021   Influenza-Unspecified 01/07/2019, 03/27/2020, 03/20/2021   PFIZER(Purple Top)SARS-COV-2 Vaccination 07/09/2019, 08/13/2019, 12/21/2019   PNEUMOCOCCAL CONJUGATE-20 04/30/2023   Pneumococcal Polysaccharide-23 06/05/2017, 06/14/2018, 12/21/2019, 12/21/2020, 06/28/2021   Tdap 02/15/2018, 02/15/2018   Zoster Recombinant(Shingrix) 08/12/2017, 12/28/2017    TDAP status: Up to date  Flu Vaccine status: Up to date  Pneumococcal vaccine status: Up to date  Covid-19 vaccine status: Information provided on how to obtain vaccines.   Qualifies for Shingles Vaccine? No   Zostavax completed Yes   Shingrix Completed?: Yes  Screening Tests Health Maintenance  Topic Date Due   COVID-19  Vaccine (4 - 2024-25 season) 01/25/2023   Medicare Annual Wellness (AWV)  06/26/2023   DEXA SCAN  04/19/2024   MAMMOGRAM  07/30/2024   Colonoscopy  06/25/2025   DTaP/Tdap/Td (3 - Td or Tdap) 02/16/2028   Pneumonia Vaccine 81+ Years old  Completed   INFLUENZA VACCINE  Completed   Hepatitis C Screening  Completed   Zoster Vaccines- Shingrix  Completed   HPV VACCINES  Aged Out    Health Maintenance  Health Maintenance Due  Topic Date Due   COVID-19 Vaccine (4 - 2024-25 season) 01/25/2023   Medicare Annual Wellness (AWV)  06/26/2023    Colorectal cancer screening: Type of screening: Colonoscopy. Completed 06/25/2020. Repeat every 5 years  Mammogram status: Completed march 7th 2025. Repeat every year  Bone Density status: Completed 04/19/2022. Results reflect: Bone density results: OSTEOPOROSIS. Repeat every 2 years.  Lung Cancer Screening: (Low Dose CT Chest recommended if Age 68-80 years, 20 pack-year currently smoking OR have quit w/in 15years.) does not qualify.   Lung Cancer Screening Referral: n/a  Additional Screening:  Hepatitis C Screening: does qualify; Completed 10/21/2022  Vision Screening: Recommended annual ophthalmology exams for early detection of glaucoma and other disorders of the eye. Is the patient up to date with their annual eye exam?  Yes  Who is the provider or what is the name of the office in which the patient attends annual eye exams? N/a If pt is not established with a provider, would they like to be referred to a provider to establish care? No .   Dental Screening: Recommended annual dental exams for proper oral hygiene  Diabetic Foot Exam: doesn't have Diabetes  Community Resource Referral / Chronic Care Management: CRR required this visit?  No   CCM required this visit?  No     Plan:     I have personally reviewed  and noted the following in the patient's chart:   Medical and social history Use of alcohol, tobacco or illicit drugs   Current medications and supplements including opioid prescriptions. Patient is not currently taking opioid prescriptions. Functional ability and status Nutritional status Physical activity Advanced directives List of other physicians Hospitalizations, surgeries, and ER visits in previous 12 months Vitals Screenings to include cognitive, depression, and falls Referrals and appointments  In addition, I have reviewed and discussed with patient certain preventive protocols, quality metrics, and best practice recommendations. A written personalized care plan for preventive services as well as general preventive health recommendations were provided to patient.     Marlyn Corporal, CMA   08/14/2023   After Visit Summary: (Declined) Due to this being a telephonic visit, with patients personalized plan was offered to patient but patient Declined AVS at this time   Nurse Notes: patient was pleasant.

## 2023-08-20 DIAGNOSIS — J3089 Other allergic rhinitis: Secondary | ICD-10-CM | POA: Diagnosis not present

## 2023-08-20 DIAGNOSIS — J301 Allergic rhinitis due to pollen: Secondary | ICD-10-CM | POA: Diagnosis not present

## 2023-08-20 DIAGNOSIS — J3081 Allergic rhinitis due to animal (cat) (dog) hair and dander: Secondary | ICD-10-CM | POA: Diagnosis not present

## 2023-08-25 DIAGNOSIS — K5730 Diverticulosis of large intestine without perforation or abscess without bleeding: Secondary | ICD-10-CM | POA: Diagnosis not present

## 2023-08-25 DIAGNOSIS — D124 Benign neoplasm of descending colon: Secondary | ICD-10-CM | POA: Diagnosis not present

## 2023-08-25 DIAGNOSIS — Z09 Encounter for follow-up examination after completed treatment for conditions other than malignant neoplasm: Secondary | ICD-10-CM | POA: Diagnosis not present

## 2023-08-25 DIAGNOSIS — Z860101 Personal history of adenomatous and serrated colon polyps: Secondary | ICD-10-CM | POA: Diagnosis not present

## 2023-09-02 DIAGNOSIS — J301 Allergic rhinitis due to pollen: Secondary | ICD-10-CM | POA: Diagnosis not present

## 2023-09-02 DIAGNOSIS — J3081 Allergic rhinitis due to animal (cat) (dog) hair and dander: Secondary | ICD-10-CM | POA: Diagnosis not present

## 2023-09-02 DIAGNOSIS — J3089 Other allergic rhinitis: Secondary | ICD-10-CM | POA: Diagnosis not present

## 2023-09-17 DIAGNOSIS — J3081 Allergic rhinitis due to animal (cat) (dog) hair and dander: Secondary | ICD-10-CM | POA: Diagnosis not present

## 2023-09-17 DIAGNOSIS — J3089 Other allergic rhinitis: Secondary | ICD-10-CM | POA: Diagnosis not present

## 2023-09-17 DIAGNOSIS — J301 Allergic rhinitis due to pollen: Secondary | ICD-10-CM | POA: Diagnosis not present

## 2023-09-25 DIAGNOSIS — J3089 Other allergic rhinitis: Secondary | ICD-10-CM | POA: Diagnosis not present

## 2023-09-25 DIAGNOSIS — J3081 Allergic rhinitis due to animal (cat) (dog) hair and dander: Secondary | ICD-10-CM | POA: Diagnosis not present

## 2023-09-25 DIAGNOSIS — J301 Allergic rhinitis due to pollen: Secondary | ICD-10-CM | POA: Diagnosis not present

## 2023-10-01 DIAGNOSIS — J3089 Other allergic rhinitis: Secondary | ICD-10-CM | POA: Diagnosis not present

## 2023-10-01 DIAGNOSIS — J3081 Allergic rhinitis due to animal (cat) (dog) hair and dander: Secondary | ICD-10-CM | POA: Diagnosis not present

## 2023-10-01 DIAGNOSIS — J301 Allergic rhinitis due to pollen: Secondary | ICD-10-CM | POA: Diagnosis not present

## 2023-10-12 ENCOUNTER — Ambulatory Visit (INDEPENDENT_AMBULATORY_CARE_PROVIDER_SITE_OTHER): Payer: Medicare HMO | Admitting: Nurse Practitioner

## 2023-10-12 ENCOUNTER — Encounter: Payer: Self-pay | Admitting: Nurse Practitioner

## 2023-10-12 VITALS — BP 120/78 | HR 75 | Temp 98.5°F | Ht <= 58 in | Wt 151.4 lb

## 2023-10-12 DIAGNOSIS — I119 Hypertensive heart disease without heart failure: Secondary | ICD-10-CM | POA: Diagnosis not present

## 2023-10-12 DIAGNOSIS — Z6832 Body mass index (BMI) 32.0-32.9, adult: Secondary | ICD-10-CM

## 2023-10-12 DIAGNOSIS — Z2821 Immunization not carried out because of patient refusal: Secondary | ICD-10-CM

## 2023-10-12 DIAGNOSIS — E782 Mixed hyperlipidemia: Secondary | ICD-10-CM

## 2023-10-12 DIAGNOSIS — E66811 Obesity, class 1: Secondary | ICD-10-CM | POA: Diagnosis not present

## 2023-10-12 DIAGNOSIS — M810 Age-related osteoporosis without current pathological fracture: Secondary | ICD-10-CM

## 2023-10-12 DIAGNOSIS — I700 Atherosclerosis of aorta: Secondary | ICD-10-CM | POA: Diagnosis not present

## 2023-10-12 DIAGNOSIS — E6609 Other obesity due to excess calories: Secondary | ICD-10-CM | POA: Diagnosis not present

## 2023-10-12 NOTE — Progress Notes (Signed)
 [place] Favia, CMA,acting as a Neurosurgeon for Susanna Epley, FNP.,have documented all relevant documentation on the behalf of Susanna Epley, FNP,as directed by  Susanna Epley, FNP while in the presence of Susanna Epley, FNP.  Subjective:  Patient ID: Linda Moreno , female    DOB: 1945/07/23 , 78 y.o.   MRN: [id number]  Chief Complaint  Patient presents with   Hypertension    Patient presents today for a bp and chol follow up, Patient reports compliance with medication. Patient denies any chest pain, SOB, or headaches. Patient has no concerns today.     HPI  She had her mammogram done. She has her appt in July for small cataracts. She had her colonoscopy and no longer has to have them done. She has been having a little swelling to her left leg and her daughter ordered some compression socks.   Patient has been taking care of grandchildren but mentions this will stop in August when they start daycare or school. Patient expresses interest in joining exercise programs at the [facility] with friends once childcare responsibilities decrease.     Past Medical History:  Diagnosis Date   Asthma    Asthma exacerbation 06/22/2021   Bilateral impacted cerumen 04/30/2023   Mild persistent asthma, uncomplicated 06/22/2021   Right bundle branch block 06/25/2021     Family History  Problem Relation Age of Onset   Diabetes Father    Stroke Father    Breast cancer Neg Hx      Current Outpatient Medications:    albuterol  (PROVENTIL  HFA;VENTOLIN  HFA) 108 (90 Base) MCG/ACT inhaler, Inhale 2 puffs into the lungs every 4 (four) hours as needed for wheezing or shortness of breath., Disp: , Rfl:    amLODipine  (NORVASC ) 10 MG tablet, Take 1 tablet (10 mg total) by mouth daily., Disp: 90 tablet, Rfl: 3   atorvastatin  (LIPITOR) 10 MG tablet, Take 1 tablet (10 mg total) by mouth daily., Disp: 90 tablet, Rfl: 3   cetirizine  (ZYRTEC  ALLERGY) 10 MG tablet, Take 1 tablet (10 mg total) by mouth daily. (Patient taking  differently: Take 10 mg by mouth daily as needed for allergies.), Disp: 90 tablet, Rfl: 1   EPINEPHrine  0.3 mg/0.3 mL IJ SOAJ injection, Inject into the muscle., Disp: , Rfl:    fluticasone  furoate-vilanterol (BREO ELLIPTA ) 100-25 MCG/INH AEPB, Inhale 1 puff into the lungs daily., Disp: , Rfl:    ibandronate  (BONIVA ) 150 MG tablet, TAKE 1 TABLET ORALLY ONCE MONTHLY WITH 8-10 OZ WATER. SIT UPRIGHT AND NO FOOD/DRINK FOR 1 HOUR., Disp: 30 tablet, Rfl: 0   multivitamin-iron-minerals-folic acid (CENTRUM) chewable tablet, Chew 1 tablet by mouth daily., Disp: , Rfl:    Allergies  Allergen Reactions   Shellfish Allergy      Review of Systems  Constitutional: Negative.   Respiratory: Negative.  Negative for shortness of breath.   Cardiovascular: Negative.  Negative for chest pain, palpitations and leg swelling.  Gastrointestinal: Negative.   Neurological: Negative.  Negative for headaches.  Psychiatric/Behavioral: Negative.       Today's Vitals   10/12/23 1057  BP: 120/78  Pulse: 75  Temp: 98.5 F (36.9 C)  TempSrc: Oral  Weight: 151 lb 6.4 oz (68.7 kg)  Height: 4\' 9"  (1.448 m)  PainSc: 0-No pain   Body mass index is 32.76 kg/m.  Wt Readings from Last 3 Encounters:  10/12/23 151 lb 6.4 oz (68.7 kg)  05/09/23 149 lb (67.6 kg)  04/30/23 149 lb 9.6 oz (67.9 kg)  Objective:  Physical Exam Vitals and nursing note reviewed.  Constitutional:      General: She is not in acute distress.    Appearance: Normal appearance. She is obese.  Cardiovascular:     Rate and Rhythm: Normal rate and regular rhythm.     Pulses: Normal pulses.     Heart sounds: Normal heart sounds. No murmur heard. Pulmonary:     Effort: Pulmonary effort is normal. No respiratory distress.     Breath sounds: Normal breath sounds. No wheezing.  Skin:    General: Skin is warm and dry.     Capillary Refill: Capillary refill takes less than 2 seconds.  Neurological:     General: No focal deficit present.      Mental Status: She is alert and oriented to person, place, and time.     Cranial Nerves: No cranial nerve deficit.     Motor: No weakness.  Psychiatric:        Mood and Affect: Mood normal.        Behavior: Behavior normal.        Thought Content: Thought content normal.        Judgment: Judgment normal.         Assessment And Plan:  Hypertensive heart disease without CHF Assessment & Plan: Blood pressure is well controlled, continue current medications. Continue focusing on lifestyle modifications.  Orders: -     BMP8+eGFR  Mixed hyperlipidemia Assessment & Plan: Cholesterol levels are stable, continue current medications   Aortic atherosclerosis (HCC) Assessment & Plan: Continue statin.    Age-related osteoporosis without current pathological fracture Assessment & Plan: Continue f/u with GYN    COVID-19 vaccination declined Assessment & Plan: Declines covid 19 vaccine. Discussed risk of covid 58 and if she changes her mind about the vaccine to call the office. Education has been provided regarding the importance of this vaccine but patient still declined. Advised may receive this vaccine at local pharmacy or Health Dept.or vaccine clinic. Aware to provide a copy of the vaccination record if obtained from local pharmacy or Health Dept.  Encouraged to take multivitamin, vitamin d , vitamin c and zinc to increase immune system. Aware can call office if would like to have vaccine here at office. Verbalized acceptance and understanding.    Class 1 obesity due to excess calories without serious comorbidity with body mass index (BMI) of 32.0 to 32.9 in adult Assessment & Plan: She is encouraged to strive for BMI less than 30 to decrease cardiac risk. Advised to aim for at least 150 minutes of exercise per week.      Return for keep same next. .  Patient was given opportunity to ask questions. Patient verbalized understanding of the plan and was able to repeat key elements  of the plan. All questions were answered to their satisfaction.    Inge Mangle, FNP, have reviewed all documentation for this visit. The documentation on 10/12/23 for the exam, diagnosis, procedures, and orders are all accurate and complete.   IF YOU HAVE BEEN REFERRED TO A SPECIALIST, IT MAY TAKE 1-2 WEEKS TO SCHEDULE/PROCESS THE REFERRAL. IF YOU HAVE NOT HEARD FROM US /SPECIALIST IN TWO WEEKS, PLEASE GIVE US  A CALL AT [phone].

## 2023-10-12 NOTE — Assessment & Plan Note (Signed)
 Continue statin.

## 2023-10-12 NOTE — Assessment & Plan Note (Signed)
Continue f/u with GYN

## 2023-10-12 NOTE — Assessment & Plan Note (Signed)
 Cholesterol levels are stable, continue current medications.

## 2023-10-12 NOTE — Assessment & Plan Note (Addendum)
 Blood pressure is well controlled, continue current medications. Continue focusing on lifestyle modifications. Congratulated her on how well she is doing with her blood pressure

## 2023-10-12 NOTE — Assessment & Plan Note (Signed)

## 2023-10-12 NOTE — Assessment & Plan Note (Signed)
 She is encouraged to strive for BMI less than 30 to decrease cardiac risk. Advised to aim for at least 150 minutes of exercise per week.

## 2023-10-13 ENCOUNTER — Ambulatory Visit: Payer: Self-pay | Admitting: Nurse Practitioner

## 2023-10-13 LAB — BMP8+EGFR
BUN/Creatinine Ratio: 21 (ref 12–28)
BUN: 16 mg/dL (ref 8–27)
CO2: 21 mmol/L (ref 20–29)
Calcium: 9.5 mg/dL (ref 8.7–10.3)
Chloride: 102 mmol/L (ref 96–106)
Creatinine, Ser: 0.78 mg/dL (ref 0.57–1.00)
Glucose: 88 mg/dL (ref 70–99)
Potassium: 4.6 mmol/L (ref 3.5–5.2)
Sodium: 141 mmol/L (ref 134–144)
eGFR: 77 mL/min/{1.73_m2} (ref >59)

## 2023-10-21 DIAGNOSIS — J3081 Allergic rhinitis due to animal (cat) (dog) hair and dander: Secondary | ICD-10-CM | POA: Diagnosis not present

## 2023-10-21 DIAGNOSIS — J301 Allergic rhinitis due to pollen: Secondary | ICD-10-CM | POA: Diagnosis not present

## 2023-10-21 DIAGNOSIS — J3089 Other allergic rhinitis: Secondary | ICD-10-CM | POA: Diagnosis not present

## 2023-10-30 DIAGNOSIS — J3089 Other allergic rhinitis: Secondary | ICD-10-CM | POA: Diagnosis not present

## 2023-10-30 DIAGNOSIS — J3081 Allergic rhinitis due to animal (cat) (dog) hair and dander: Secondary | ICD-10-CM | POA: Diagnosis not present

## 2023-10-30 DIAGNOSIS — J301 Allergic rhinitis due to pollen: Secondary | ICD-10-CM | POA: Diagnosis not present

## 2023-11-04 DIAGNOSIS — J3081 Allergic rhinitis due to animal (cat) (dog) hair and dander: Secondary | ICD-10-CM | POA: Diagnosis not present

## 2023-11-04 DIAGNOSIS — J301 Allergic rhinitis due to pollen: Secondary | ICD-10-CM | POA: Diagnosis not present

## 2023-11-04 DIAGNOSIS — J3089 Other allergic rhinitis: Secondary | ICD-10-CM | POA: Diagnosis not present

## 2023-11-11 DIAGNOSIS — J3081 Allergic rhinitis due to animal (cat) (dog) hair and dander: Secondary | ICD-10-CM | POA: Diagnosis not present

## 2023-11-11 DIAGNOSIS — J301 Allergic rhinitis due to pollen: Secondary | ICD-10-CM | POA: Diagnosis not present

## 2023-11-11 DIAGNOSIS — J3089 Other allergic rhinitis: Secondary | ICD-10-CM | POA: Diagnosis not present

## 2023-11-25 DIAGNOSIS — H25813 Combined forms of age-related cataract, bilateral: Secondary | ICD-10-CM | POA: Diagnosis not present

## 2023-12-01 DIAGNOSIS — J301 Allergic rhinitis due to pollen: Secondary | ICD-10-CM | POA: Diagnosis not present

## 2023-12-01 DIAGNOSIS — J3081 Allergic rhinitis due to animal (cat) (dog) hair and dander: Secondary | ICD-10-CM | POA: Diagnosis not present

## 2023-12-01 DIAGNOSIS — J3089 Other allergic rhinitis: Secondary | ICD-10-CM | POA: Diagnosis not present

## 2023-12-14 DIAGNOSIS — N958 Other specified menopausal and perimenopausal disorders: Secondary | ICD-10-CM | POA: Diagnosis not present

## 2023-12-14 DIAGNOSIS — J301 Allergic rhinitis due to pollen: Secondary | ICD-10-CM | POA: Diagnosis not present

## 2023-12-14 DIAGNOSIS — J3089 Other allergic rhinitis: Secondary | ICD-10-CM | POA: Diagnosis not present

## 2023-12-14 DIAGNOSIS — J3081 Allergic rhinitis due to animal (cat) (dog) hair and dander: Secondary | ICD-10-CM | POA: Diagnosis not present

## 2023-12-14 DIAGNOSIS — J4530 Mild persistent asthma, uncomplicated: Secondary | ICD-10-CM | POA: Diagnosis not present

## 2023-12-14 DIAGNOSIS — M816 Localized osteoporosis [Lequesne]: Secondary | ICD-10-CM | POA: Diagnosis not present

## 2023-12-14 DIAGNOSIS — Z91013 Allergy to seafood: Secondary | ICD-10-CM | POA: Diagnosis not present

## 2024-01-01 DIAGNOSIS — J301 Allergic rhinitis due to pollen: Secondary | ICD-10-CM | POA: Diagnosis not present

## 2024-01-01 DIAGNOSIS — J3089 Other allergic rhinitis: Secondary | ICD-10-CM | POA: Diagnosis not present

## 2024-01-01 DIAGNOSIS — J3081 Allergic rhinitis due to animal (cat) (dog) hair and dander: Secondary | ICD-10-CM | POA: Diagnosis not present

## 2024-01-12 DIAGNOSIS — M810 Age-related osteoporosis without current pathological fracture: Secondary | ICD-10-CM | POA: Diagnosis not present

## 2024-01-12 DIAGNOSIS — J301 Allergic rhinitis due to pollen: Secondary | ICD-10-CM | POA: Diagnosis not present

## 2024-01-12 DIAGNOSIS — J3089 Other allergic rhinitis: Secondary | ICD-10-CM | POA: Diagnosis not present

## 2024-01-12 DIAGNOSIS — J3081 Allergic rhinitis due to animal (cat) (dog) hair and dander: Secondary | ICD-10-CM | POA: Diagnosis not present

## 2024-01-13 ENCOUNTER — Other Ambulatory Visit (HOSPITAL_COMMUNITY): Payer: Self-pay

## 2024-01-14 ENCOUNTER — Other Ambulatory Visit: Payer: Self-pay

## 2024-01-14 ENCOUNTER — Telehealth: Payer: Self-pay

## 2024-01-14 ENCOUNTER — Encounter (HOSPITAL_COMMUNITY): Payer: Self-pay

## 2024-01-14 MED ORDER — TERIPARATIDE 560 MCG/2.24ML ~~LOC~~ SOPN
PEN_INJECTOR | SUBCUTANEOUS | 11 refills | Status: DC
  Filled 2024-02-01 (×2): qty 2.24, 28d supply, fill #0

## 2024-01-14 NOTE — Telephone Encounter (Signed)
 Pharmacy Patient Advocate Encounter   Received notification from Patient Pharmacy that prior authorization for Forteo  is required/requested.   Insurance verification completed.   The patient is insured through [facility] ADVANTAGE/RX ADVANCE .   Per test claim: PA required; However, NEW/RECENT labs/notes are needed to complete & submit PA request. Please see below.  Faxing PA key to provider-[id number]

## 2024-01-14 NOTE — Progress Notes (Signed)
 Pharmacy Patient Advocate Encounter  Insurance verification completed.   The patient is insured through [employer] ADVANTAGE/RX ADVANCE   Ran test claim for Forteo . PA required.  This test claim was processed through [facility]- copay amounts may vary at other pharmacies due to pharmacy/plan contracts, or as the patient moves through the different stages of their insurance plan.

## 2024-01-18 ENCOUNTER — Other Ambulatory Visit: Payer: Self-pay

## 2024-01-18 NOTE — Progress Notes (Signed)
 PA approved. Ran test claim-copay is [id number].

## 2024-01-18 NOTE — Telephone Encounter (Signed)
 Pharmacy Patient Advocate Encounter  Received notification from [employer] that Prior Authorization for Forteo  has been APPROVED from 01/15/24 to 01/14/26   PA #/Case ID/Reference #: [id number]

## 2024-01-19 ENCOUNTER — Other Ambulatory Visit: Payer: Self-pay

## 2024-01-22 ENCOUNTER — Other Ambulatory Visit (HOSPITAL_COMMUNITY): Payer: Self-pay

## 2024-01-22 ENCOUNTER — Other Ambulatory Visit: Payer: Self-pay

## 2024-01-28 ENCOUNTER — Other Ambulatory Visit: Payer: Self-pay

## 2024-01-28 NOTE — Progress Notes (Signed)
 Patient is going to talk to provider about alernative options due to high copay. She does not want to enroll in payment plan at this time. Dis-enrolling.

## 2024-01-29 DIAGNOSIS — J301 Allergic rhinitis due to pollen: Secondary | ICD-10-CM | POA: Diagnosis not present

## 2024-01-29 DIAGNOSIS — J3081 Allergic rhinitis due to animal (cat) (dog) hair and dander: Secondary | ICD-10-CM | POA: Diagnosis not present

## 2024-01-29 DIAGNOSIS — J3089 Other allergic rhinitis: Secondary | ICD-10-CM | POA: Diagnosis not present

## 2024-02-01 ENCOUNTER — Other Ambulatory Visit: Payer: Self-pay

## 2024-02-01 ENCOUNTER — Other Ambulatory Visit (HOSPITAL_COMMUNITY): Payer: Self-pay

## 2024-02-01 NOTE — Progress Notes (Signed)
 Payment plan on file. Pt does not want to fill at this time. Dis-enrolling for now.

## 2024-02-05 DIAGNOSIS — J3081 Allergic rhinitis due to animal (cat) (dog) hair and dander: Secondary | ICD-10-CM | POA: Diagnosis not present

## 2024-02-05 DIAGNOSIS — J301 Allergic rhinitis due to pollen: Secondary | ICD-10-CM | POA: Diagnosis not present

## 2024-02-05 DIAGNOSIS — J3089 Other allergic rhinitis: Secondary | ICD-10-CM | POA: Diagnosis not present

## 2024-02-11 DIAGNOSIS — J301 Allergic rhinitis due to pollen: Secondary | ICD-10-CM | POA: Diagnosis not present

## 2024-02-11 DIAGNOSIS — J3089 Other allergic rhinitis: Secondary | ICD-10-CM | POA: Diagnosis not present

## 2024-02-11 DIAGNOSIS — J3081 Allergic rhinitis due to animal (cat) (dog) hair and dander: Secondary | ICD-10-CM | POA: Diagnosis not present

## 2024-02-25 DIAGNOSIS — J3089 Other allergic rhinitis: Secondary | ICD-10-CM | POA: Diagnosis not present

## 2024-02-25 DIAGNOSIS — J301 Allergic rhinitis due to pollen: Secondary | ICD-10-CM | POA: Diagnosis not present

## 2024-02-25 DIAGNOSIS — J3081 Allergic rhinitis due to animal (cat) (dog) hair and dander: Secondary | ICD-10-CM | POA: Diagnosis not present

## 2024-03-02 DIAGNOSIS — Z6832 Body mass index (BMI) 32.0-32.9, adult: Secondary | ICD-10-CM | POA: Diagnosis not present

## 2024-03-02 DIAGNOSIS — Z01419 Encounter for gynecological examination (general) (routine) without abnormal findings: Secondary | ICD-10-CM | POA: Diagnosis not present

## 2024-03-03 DIAGNOSIS — J3081 Allergic rhinitis due to animal (cat) (dog) hair and dander: Secondary | ICD-10-CM | POA: Diagnosis not present

## 2024-03-03 DIAGNOSIS — J301 Allergic rhinitis due to pollen: Secondary | ICD-10-CM | POA: Diagnosis not present

## 2024-03-03 DIAGNOSIS — J3089 Other allergic rhinitis: Secondary | ICD-10-CM | POA: Diagnosis not present

## 2024-03-18 DIAGNOSIS — J301 Allergic rhinitis due to pollen: Secondary | ICD-10-CM | POA: Diagnosis not present

## 2024-03-18 DIAGNOSIS — J3081 Allergic rhinitis due to animal (cat) (dog) hair and dander: Secondary | ICD-10-CM | POA: Diagnosis not present

## 2024-03-18 DIAGNOSIS — J3089 Other allergic rhinitis: Secondary | ICD-10-CM | POA: Diagnosis not present

## 2024-04-01 DIAGNOSIS — J3089 Other allergic rhinitis: Secondary | ICD-10-CM | POA: Diagnosis not present

## 2024-04-01 DIAGNOSIS — J3081 Allergic rhinitis due to animal (cat) (dog) hair and dander: Secondary | ICD-10-CM | POA: Diagnosis not present

## 2024-04-01 DIAGNOSIS — J301 Allergic rhinitis due to pollen: Secondary | ICD-10-CM | POA: Diagnosis not present

## 2024-04-07 DIAGNOSIS — J3081 Allergic rhinitis due to animal (cat) (dog) hair and dander: Secondary | ICD-10-CM | POA: Diagnosis not present

## 2024-04-07 DIAGNOSIS — J301 Allergic rhinitis due to pollen: Secondary | ICD-10-CM | POA: Diagnosis not present

## 2024-04-07 DIAGNOSIS — J3089 Other allergic rhinitis: Secondary | ICD-10-CM | POA: Diagnosis not present

## 2024-04-14 DIAGNOSIS — J3089 Other allergic rhinitis: Secondary | ICD-10-CM | POA: Diagnosis not present

## 2024-04-14 DIAGNOSIS — J3081 Allergic rhinitis due to animal (cat) (dog) hair and dander: Secondary | ICD-10-CM | POA: Diagnosis not present

## 2024-04-14 DIAGNOSIS — J301 Allergic rhinitis due to pollen: Secondary | ICD-10-CM | POA: Diagnosis not present

## 2024-05-04 ENCOUNTER — Encounter: Payer: Medicare HMO | Admitting: Nurse Practitioner

## 2024-05-06 ENCOUNTER — Ambulatory Visit: Admitting: Family Medicine

## 2024-05-06 ENCOUNTER — Encounter: Payer: Self-pay | Admitting: Family Medicine

## 2024-05-06 VITALS — BP 120/70 | HR 79 | Temp 98.5°F | Ht <= 58 in | Wt 157.0 lb

## 2024-05-06 DIAGNOSIS — Z0000 Encounter for general adult medical examination without abnormal findings: Secondary | ICD-10-CM

## 2024-05-06 DIAGNOSIS — Z23 Encounter for immunization: Secondary | ICD-10-CM

## 2024-05-06 DIAGNOSIS — E559 Vitamin D deficiency, unspecified: Secondary | ICD-10-CM

## 2024-05-06 DIAGNOSIS — E6609 Other obesity due to excess calories: Secondary | ICD-10-CM

## 2024-05-06 DIAGNOSIS — E782 Mixed hyperlipidemia: Secondary | ICD-10-CM

## 2024-05-06 DIAGNOSIS — R7309 Other abnormal glucose: Secondary | ICD-10-CM

## 2024-05-06 DIAGNOSIS — Z6832 Body mass index (BMI) 32.0-32.9, adult: Secondary | ICD-10-CM | POA: Diagnosis not present

## 2024-05-06 DIAGNOSIS — I119 Hypertensive heart disease without heart failure: Secondary | ICD-10-CM

## 2024-05-06 DIAGNOSIS — M810 Age-related osteoporosis without current pathological fracture: Secondary | ICD-10-CM

## 2024-05-06 DIAGNOSIS — I4510 Unspecified right bundle-branch block: Secondary | ICD-10-CM

## 2024-05-06 DIAGNOSIS — J4520 Mild intermittent asthma, uncomplicated: Secondary | ICD-10-CM

## 2024-05-06 DIAGNOSIS — E66811 Obesity, class 1: Secondary | ICD-10-CM | POA: Diagnosis not present

## 2024-05-06 LAB — POCT URINALYSIS DIP (CLINITEK)
Bilirubin, UA: NEGATIVE
Blood, UA: NEGATIVE
Glucose, UA: NEGATIVE mg/dL
Ketones, POC UA: NEGATIVE mg/dL
Leukocytes, UA: NEGATIVE
Nitrite, UA: NEGATIVE
POC PROTEIN,UA: NEGATIVE
Spec Grav, UA: 1.02 (ref 1.010–1.025)
Urobilinogen, UA: 0.2 U/dL
pH, UA: 6.5 (ref 5.0–8.0)

## 2024-05-06 NOTE — Progress Notes (Signed)
 LILLETTE Kristeen JINNY Gladis, CMA,acting as a neurosurgeon for Bruna Creighton, NP.,have documented all relevant documentation on the behalf of Bruna Creighton, NP,as directed by  Bruna Creighton, NP while in the presence of Bruna Creighton, NP.  Subjective:    Patient ID: Linda Moreno , female    DOB: 03-15-1946 , 78 y.o.   MRN: [id number]  Chief Complaint  Patient presents with   Annual Exam    Patient presents today for HM, Patient reports compliance with medication. Patient denies any chest pain, SOB, or headaches. Patient has no concerns today.     HPI Discussed the use of AI scribe software for clinical note transcription with the patient, who gave verbal consent to proceed.  History of Present Illness   Linda Moreno is a 78 year old female who presents for her annual physical exam.  She is generally well with no new complaints. She continues regular visits to her gynecologist and allergist, with her last gynecological visit in October showing normal results. She receives allergy shots twice a week and has not experienced any breathing issues this year. She wears a mask in public to prevent illness and maintains regular exercise and a healthy diet.  She has a history of asthma and osteoporosis. For osteoporosis, she takes Boniva  and over-the-counter vitamin D  and calcium  supplements. Insurance issues have prevented her from taking another medication. She also has high blood pressure and high cholesterol, but no diabetes. She has seen a cardiologist, and was told that everything looked good. She recalls being told she has a right bundle branch block. No chest pain, stomach pain, or bowel irregularities.  She has arthritis, particularly in her hip, affecting her mobility, which she manages by taking things slowly. She also has a history of cataracts, which may require surgery next year, but they are currently stable.  Regular bowel movements and no episodes of incontinence. Some swelling in her feet, for which she wears  compression stockings.     Linda Moreno is a 78 year old female who presents for her annual physical exam.  She is generally well with no new complaints. She continues regular visits to her gynecologist and allergist, with her last gynecological visit in October showing normal results. She receives allergy shots twice a week and has not experienced any breathing issues this year. She wears a mask in public to prevent illness and maintains regular exercise and a healthy diet.  She has a history of asthma and osteoporosis. For osteoporosis, she takes Boniva  and over-the-counter vitamin D  and calcium  supplements. Insurance issues have prevented her from taking another medication. She also has high blood pressure and high cholesterol, but no diabetes. She has seen a cardiologist, and was told that everything looked good. She recalls being told she has a right bundle branch block. No chest pain, stomach pain, or bowel irregularities.  She has arthritis, particularly in her hip, affecting her mobility, which she manages by taking things slowly. She also has a history of cataracts, which may require surgery next year, but they are currently stable.  Regular bowel movements and no episodes of incontinence. Some swelling in her feet, for which she wears compression stockings.    BMI is 32, advised to exercise as much as she can tolerate.     Past Medical History:  Diagnosis Date   Asthma    Asthma exacerbation 06/22/2021   Bilateral impacted cerumen 04/30/2023   Mild persistent asthma, uncomplicated 06/22/2021   Right bundle branch block 06/25/2021  Family History  Problem Relation Age of Onset   Diabetes Father    Stroke Father    Breast cancer Neg Hx     Current Medications[1]   Allergies[2]     Social History   Substance and Sexual Activity  Alcohol Use No  .    Review of Systems  Constitutional: Negative.   HENT: Negative.    Respiratory: Negative.    Cardiovascular:  Negative.   Endocrine: Negative.   Genitourinary: Negative.   Musculoskeletal: Negative.   Skin: Negative.   Neurological: Negative.   Hematological: Negative.   Psychiatric/Behavioral: Negative.       Today's Vitals   05/06/24 0904  BP: 120/70  Pulse: 79  Temp: 98.5 F (36.9 C)  TempSrc: Oral  Weight: 157 lb (71.2 kg)  Height: 4' 9 (1.448 m)  PainSc: 0-No pain   Body mass index is 33.97 kg/m.  Wt Readings from Last 3 Encounters:  05/06/24 157 lb (71.2 kg)  10/12/23 151 lb 6.4 oz (68.7 kg)  05/09/23 149 lb (67.6 kg)     Objective:  Physical Exam Constitutional:      Appearance: Normal appearance.  HENT:     Head: Normocephalic.     Nose: Nose normal.  Cardiovascular:     Rate and Rhythm: Normal rate and regular rhythm.     Pulses: Normal pulses.     Heart sounds: Normal heart sounds.  Pulmonary:     Effort: Pulmonary effort is normal.     Breath sounds: Normal breath sounds.  Abdominal:     General: Bowel sounds are normal.  Musculoskeletal:        General: Normal range of motion.  Skin:    General: Skin is warm and dry.  Neurological:     General: No focal deficit present.     Mental Status: She is alert and oriented to person, place, and time. Mental status is at baseline.  Psychiatric:        Mood and Affect: Mood normal.         Assessment And Plan:     Encounter for annual health examination  Mixed hyperlipidemia Assessment & Plan: Managed with dietary modifications. No new symptoms or complications. - Continue current dietary modifications.  Orders: -     Lipid panel  Vitamin D  deficiency Assessment & Plan: - Checked vitamin D  levels today.  Orders: -     VITAMIN D  25 Hydroxy (Vit-D Deficiency, Fractures)  Need for influenza vaccination -     Flu vaccine HIGH DOSE PF(Fluzone Trivalent)  Hypertensive heart disease without CHF -     EKG 12-Lead -     POCT URINALYSIS DIP (CLINITEK) -     Microalbumin / creatinine urine ratio -      CBC with Differential/Platelet -     CMP14+EGFR  Right bundle branch block Assessment & Plan: Noted on EKG, consistent with previous findings. No new symptoms or complications. - Continue monitoring as per previous recommendations.   Abnormal glucose -     Hemoglobin A1c  Class 1 obesity due to excess calories without serious comorbidity with body mass index (BMI) of 32.0 to 32.9 in adult Assessment & Plan: She is encouraged to strive for BMI less than 30 to decrease cardiac risk. Advised to aim for at least 150 minutes of exercise per week.    Mild intermittent asthma without complication Assessment & Plan: Well-controlled with current management. No recent exacerbations.    Age-related osteoporosis without current pathological fracture  Assessment & Plan: Managed with vitamin D  and calcium . Insurance issues prevent additional injectable treatment. - Continue vitamin D  and calcium  supplementation.     Return for 1 year physical, 6 month BP management. Patient was given opportunity to ask questions. Patient verbalized understanding of the plan and was able to repeat key elements of the plan. All questions were answered to their satisfaction.   I, Bruna Creighton, NP, have reviewed all documentation for this visit. The documentation on 05/18/2024 for the exam, diagnosis, procedures, and orders are all accurate and complete.      [1]  Current Outpatient Medications:    albuterol  (PROVENTIL  HFA;VENTOLIN  HFA) 108 (90 Base) MCG/ACT inhaler, Inhale 2 puffs into the lungs every 4 (four) hours as needed for wheezing or shortness of breath., Disp: , Rfl:    amLODipine  (NORVASC ) 10 MG tablet, Take 1 tablet (10 mg total) by mouth daily., Disp: 90 tablet, Rfl: 3   atorvastatin  (LIPITOR) 10 MG tablet, Take 1 tablet (10 mg total) by mouth daily., Disp: 90 tablet, Rfl: 3   cetirizine  (ZYRTEC  ALLERGY) 10 MG tablet, Take 1 tablet (10 mg total) by mouth daily. (Patient taking differently: Take  10 mg by mouth daily as needed for allergies.), Disp: 90 tablet, Rfl: 1   EPINEPHrine  0.3 mg/0.3 mL IJ SOAJ injection, Inject into the muscle., Disp: , Rfl:    fluticasone  furoate-vilanterol (BREO ELLIPTA ) 100-25 MCG/INH AEPB, Inhale 1 puff into the lungs daily., Disp: , Rfl:    ibandronate  (BONIVA ) 150 MG tablet, TAKE 1 TABLET ORALLY ONCE MONTHLY WITH 8-10 OZ WATER. SIT UPRIGHT AND NO FOOD/DRINK FOR 1 HOUR., Disp: 30 tablet, Rfl: 0   multivitamin-iron-minerals-folic acid (CENTRUM) chewable tablet, Chew 1 tablet by mouth daily., Disp: , Rfl:  [2]  Allergies Allergen Reactions   Shellfish Allergy

## 2024-05-07 LAB — CMP14+EGFR
ALT: 15 IU/L (ref 0–32)
AST: 24 IU/L (ref 0–40)
Albumin: 4.5 g/dL (ref 3.8–4.8)
Alkaline Phosphatase: 99 IU/L (ref 49–135)
BUN/Creatinine Ratio: 16 (ref 12–28)
BUN: 13 mg/dL (ref 8–27)
Bilirubin Total: 0.8 mg/dL (ref 0.0–1.2)
CO2: 22 mmol/L (ref 20–29)
Calcium: 9.4 mg/dL (ref 8.7–10.3)
Chloride: 101 mmol/L (ref 96–106)
Creatinine, Ser: 0.83 mg/dL (ref 0.57–1.00)
Globulin, Total: 3.1 g/dL (ref 1.5–4.5)
Glucose: 95 mg/dL (ref 70–99)
Potassium: 4.2 mmol/L (ref 3.5–5.2)
Sodium: 141 mmol/L (ref 134–144)
Total Protein: 7.6 g/dL (ref 6.0–8.5)
eGFR: 72 mL/min/1.73 (ref >59)

## 2024-05-07 LAB — CBC WITH DIFFERENTIAL/PLATELET
Basophils Absolute: 0 x10E3/uL (ref 0.0–0.2)
Basos: 1 %
EOS (ABSOLUTE): 0.3 x10E3/uL (ref 0.0–0.4)
Eos: 4 %
Hematocrit: 39.3 % (ref 34.0–46.6)
Hemoglobin: 13.7 g/dL (ref 11.1–15.9)
Immature Grans (Abs): 0 x10E3/uL (ref 0.0–0.1)
Immature Granulocytes: 0 %
Lymphocytes Absolute: 1.4 x10E3/uL (ref 0.7–3.1)
Lymphs: 25 %
MCH: 30.9 pg (ref 26.6–33.0)
MCHC: 34.9 g/dL (ref 31.5–35.7)
MCV: 89 fL (ref 79–97)
Monocytes Absolute: 0.5 x10E3/uL (ref 0.1–0.9)
Monocytes: 9 %
Neutrophils Absolute: 3.6 x10E3/uL (ref 1.4–7.0)
Neutrophils: 61 %
Platelets: 228 x10E3/uL (ref 150–450)
RBC: 4.43 x10E6/uL (ref 3.77–5.28)
RDW: 12.3 % (ref 11.7–15.4)
WBC: 5.9 x10E3/uL (ref 3.4–10.8)

## 2024-05-07 LAB — MICROALBUMIN / CREATININE URINE RATIO
Creatinine, Urine: 152.4 mg/dL
Microalb/Creat Ratio: 4 mg/g{creat} (ref 0–29)
Microalbumin, Urine: 6 ug/mL

## 2024-05-07 LAB — VITAMIN D 25 HYDROXY (VIT D DEFICIENCY, FRACTURES): Vit D, 25-Hydroxy: 59 ng/mL (ref 30.0–100.0)

## 2024-05-07 LAB — HEMOGLOBIN A1C
Est. average glucose Bld gHb Est-mCnc: 111 mg/dL
Hgb A1c MFr Bld: 5.5 % (ref 4.8–5.6)

## 2024-05-07 LAB — LIPID PANEL
Chol/HDL Ratio: 2 ratio (ref 0.0–4.4)
Cholesterol, Total: 179 mg/dL (ref 100–199)
HDL: 90 mg/dL (ref >39)
LDL Chol Calc (NIH): 80 mg/dL (ref 0–99)
Triglycerides: 43 mg/dL (ref 0–149)
VLDL Cholesterol Cal: 9 mg/dL (ref 5–40)

## 2024-05-18 ENCOUNTER — Ambulatory Visit: Payer: Self-pay | Admitting: Family Medicine

## 2024-05-18 NOTE — Assessment & Plan Note (Signed)
 Managed with vitamin D  and calcium . Insurance issues prevent additional injectable treatment. - Continue vitamin D  and calcium  supplementation.

## 2024-05-18 NOTE — Assessment & Plan Note (Signed)
 Managed with dietary modifications. No new symptoms or complications. - Continue current dietary modifications.

## 2024-05-18 NOTE — Assessment & Plan Note (Signed)
 She is encouraged to strive for BMI less than 30 to decrease cardiac risk. Advised to aim for at least 150 minutes of exercise per week.

## 2024-05-18 NOTE — Assessment & Plan Note (Signed)
-   Checked vitamin D  levels today.

## 2024-05-18 NOTE — Assessment & Plan Note (Signed)
 Noted on EKG, consistent with previous findings. No new symptoms or complications. - Continue monitoring as per previous recommendations.

## 2024-05-18 NOTE — Assessment & Plan Note (Signed)
 Well-controlled with current management. No recent exacerbations.

## 2024-05-18 NOTE — Progress Notes (Signed)
 All labs are normal.   Thank you!

## 2024-06-30 ENCOUNTER — Other Ambulatory Visit: Payer: Self-pay | Admitting: Nurse Practitioner

## 2024-06-30 DIAGNOSIS — Z1231 Encounter for screening mammogram for malignant neoplasm of breast: Secondary | ICD-10-CM

## 2024-08-01 ENCOUNTER — Ambulatory Visit

## 2024-09-07 ENCOUNTER — Ambulatory Visit

## 2024-09-07 ENCOUNTER — Ambulatory Visit: Payer: Self-pay

## 2024-11-08 ENCOUNTER — Ambulatory Visit: Payer: Self-pay | Admitting: Nurse Practitioner

## 2025-05-08 ENCOUNTER — Encounter: Payer: Self-pay | Admitting: Nurse Practitioner
